# Patient Record
Sex: Female | Born: 1960
Health system: Southern US, Community
[De-identification: ages and names within clinical notes are randomized; demographics above are authoritative.]

## PROBLEM LIST (undated history)

## (undated) DIAGNOSIS — Z8489 Family history of other specified conditions: Secondary | ICD-10-CM

## (undated) DIAGNOSIS — F988 Other specified behavioral and emotional disorders with onset usually occurring in childhood and adolescence: Secondary | ICD-10-CM

## (undated) DIAGNOSIS — R030 Elevated blood-pressure reading, without diagnosis of hypertension: Secondary | ICD-10-CM

## (undated) DIAGNOSIS — T753XXA Motion sickness, initial encounter: Secondary | ICD-10-CM

## (undated) DIAGNOSIS — Z973 Presence of spectacles and contact lenses: Secondary | ICD-10-CM

## (undated) DIAGNOSIS — M1990 Unspecified osteoarthritis, unspecified site: Secondary | ICD-10-CM

## (undated) HISTORY — DX: Other specified behavioral and emotional disorders with onset usually occurring in childhood and adolescence: F98.8

## (undated) HISTORY — DX: Elevated blood-pressure reading, without diagnosis of hypertension: R03.0

## (undated) HISTORY — PX: BUNIONECTOMY: SHX129

---

## 2000-09-12 HISTORY — PX: BREAST BIOPSY: SHX20

## 2005-06-08 ENCOUNTER — Other Ambulatory Visit: Payer: Self-pay

## 2005-06-08 ENCOUNTER — Emergency Department: Payer: Self-pay | Admitting: Emergency Medicine

## 2008-08-23 ENCOUNTER — Emergency Department: Payer: Self-pay | Admitting: Emergency Medicine

## 2008-09-17 ENCOUNTER — Ambulatory Visit: Payer: Self-pay

## 2009-12-09 ENCOUNTER — Ambulatory Visit: Payer: Self-pay

## 2012-05-29 ENCOUNTER — Ambulatory Visit: Payer: Self-pay

## 2012-06-06 ENCOUNTER — Ambulatory Visit: Payer: Self-pay

## 2013-12-11 ENCOUNTER — Ambulatory Visit (INDEPENDENT_AMBULATORY_CARE_PROVIDER_SITE_OTHER): Payer: BC Managed Care – PPO

## 2013-12-11 ENCOUNTER — Ambulatory Visit (INDEPENDENT_AMBULATORY_CARE_PROVIDER_SITE_OTHER): Payer: BC Managed Care – PPO | Admitting: Podiatry

## 2013-12-11 ENCOUNTER — Encounter: Payer: Self-pay | Admitting: Podiatry

## 2013-12-11 VITALS — Resp 16 | Ht 58.5 in | Wt 188.0 lb

## 2013-12-11 DIAGNOSIS — M79609 Pain in unspecified limb: Secondary | ICD-10-CM

## 2013-12-11 DIAGNOSIS — M2020 Hallux rigidus, unspecified foot: Secondary | ICD-10-CM

## 2013-12-11 DIAGNOSIS — M79673 Pain in unspecified foot: Secondary | ICD-10-CM

## 2013-12-11 NOTE — Progress Notes (Signed)
   Subjective:    Patient ID: Megan [name], female    DOB: 26-Jan-1961, 53 y.o.   MRN: [id number]  HPI Comments: Its both feet on top where they are red and bumpy. Its been going on for 10 years and they are getting worse. i can only wear tennis shoes with orthotics in them. i take motrin when they get really bad. i took celebrex in the past and it helps.   Foot Pain Associated symptoms include numbness.      Review of Systems  Constitutional: Negative.   HENT: Negative.   Eyes: Negative.   Respiratory: Negative.   Cardiovascular: Negative.   Gastrointestinal: Negative.   Endocrine: Negative.   Musculoskeletal:       Joint pain Difficulty walking  Skin: Negative.   Allergic/Immunologic: Negative.   Neurological: Positive for numbness.  Hematological: Negative.   Psychiatric/Behavioral: The patient is nervous/anxious.        Objective:   Physical Exam I have reviewed her past medical history medications allergies surgeries social history and review of systems. Pulses are palpable bilateral. Neurologic sensorium is intact. Orthopedic evaluation demonstrates limited range of motion of the first metatarsophalangeal joint bilaterally. Radiographic evaluation confirms osteoarthritis of both first metatarsophalangeal joints bilaterally.        Assessment & Plan:  Assessment: Hallux rigidus first metatarsophalangeal joint bilateral.  Plan: Injected dexamethasone and local anesthetic to the first metatarsophalangeal joint bilaterally. Discussed the need for surgical intervention consisting of a arthroplasty with single silicone implant. I will followup with her in July for surgical consult.

## 2014-01-13 ENCOUNTER — Ambulatory Visit (INDEPENDENT_AMBULATORY_CARE_PROVIDER_SITE_OTHER): Payer: BC Managed Care – PPO | Admitting: Podiatry

## 2014-01-13 VITALS — BP 143/86 | HR 91 | Resp 16

## 2014-01-13 DIAGNOSIS — M778 Other enthesopathies, not elsewhere classified: Secondary | ICD-10-CM

## 2014-01-13 DIAGNOSIS — M7750 Other enthesopathy of unspecified foot: Secondary | ICD-10-CM

## 2014-01-13 DIAGNOSIS — M779 Enthesopathy, unspecified: Principal | ICD-10-CM

## 2014-01-13 MED ORDER — CELECOXIB 200 MG PO CAPS
200.0000 mg | ORAL_CAPSULE | Freq: Two times a day (BID) | ORAL | Status: DC
Start: 2014-01-13 — End: 2018-03-06

## 2014-01-13 NOTE — Progress Notes (Signed)
She presents today for followup of her capsulitis and hallux limitus first metatarsophalangeal joint right greater than left. She states that the right foot hurts worse since the injection the left foot feels better since the injection.  Objective: Pain on range of motion and on palpation of the first metatarsophalangeal joint minimally so left foot.  Assessment: Hallux limitus with capsulitis first metatarsophalangeal joint right greater than left.  Plan: Injected with Kenalog and local anesthetic first metatarsophalangeal joint today after sterile Betadine skin prep. We did not inject the left foot

## 2014-03-06 ENCOUNTER — Ambulatory Visit (INDEPENDENT_AMBULATORY_CARE_PROVIDER_SITE_OTHER): Payer: BC Managed Care – PPO | Admitting: Podiatry

## 2014-03-06 ENCOUNTER — Encounter: Payer: Self-pay | Admitting: Podiatry

## 2014-03-06 DIAGNOSIS — M779 Enthesopathy, unspecified: Principal | ICD-10-CM

## 2014-03-06 DIAGNOSIS — M778 Other enthesopathies, not elsewhere classified: Secondary | ICD-10-CM

## 2014-03-06 DIAGNOSIS — M7750 Other enthesopathy of unspecified foot: Secondary | ICD-10-CM

## 2014-03-06 NOTE — Progress Notes (Signed)
She presents today complaining of pain to the first metatarsophalangeal joints bilateral. History of hallux limitus bilateral. With capsulitis being majority of her symptoms.  Objective: Vital signs are stable she is alert and oriented x3. Pain on palpation and range of motion of the first metatarsophalangeal joint bilateral.  Assessment: Capsulitis hallux limitus bilateral first metatarsophalangeal joints bilateral.  Plan: Injected today after sterile Betadine skin prep 20 mg of Kenalog to the first metatarsophalangeal joints bilaterally intra-articular. Remember to ask her however tripped [facility] went. Also remember to ask her about her sick brother.

## 2015-05-20 ENCOUNTER — Other Ambulatory Visit: Payer: Self-pay | Admitting: Obstetrics and Gynecology

## 2015-05-20 DIAGNOSIS — Z1231 Encounter for screening mammogram for malignant neoplasm of breast: Secondary | ICD-10-CM

## 2015-05-20 DIAGNOSIS — Z13820 Encounter for screening for osteoporosis: Secondary | ICD-10-CM

## 2015-06-25 ENCOUNTER — Ambulatory Visit
Admission: RE | Admit: 2015-06-25 | Discharge: 2015-06-25 | Disposition: A | Discharge status: Home / Self Care | Payer: BLUE CROSS/BLUE SHIELD | Source: Ambulatory Visit | Attending: Obstetrics and Gynecology | Admitting: Obstetrics and Gynecology

## 2015-06-25 DIAGNOSIS — Z13820 Encounter for screening for osteoporosis: Secondary | ICD-10-CM

## 2015-06-25 DIAGNOSIS — Z1231 Encounter for screening mammogram for malignant neoplasm of breast: Secondary | ICD-10-CM | POA: Insufficient documentation

## 2015-06-29 ENCOUNTER — Other Ambulatory Visit: Payer: Self-pay | Admitting: Obstetrics and Gynecology

## 2015-06-29 DIAGNOSIS — R928 Other abnormal and inconclusive findings on diagnostic imaging of breast: Secondary | ICD-10-CM

## 2015-07-07 ENCOUNTER — Other Ambulatory Visit: Payer: Self-pay | Admitting: Obstetrics and Gynecology

## 2015-07-07 DIAGNOSIS — N63 Unspecified lump in unspecified breast: Secondary | ICD-10-CM

## 2015-07-16 ENCOUNTER — Ambulatory Visit
Admission: RE | Admit: 2015-07-16 | Discharge: 2015-07-16 | Disposition: A | Discharge status: Home / Self Care | Payer: BLUE CROSS/BLUE SHIELD | Source: Ambulatory Visit | Attending: Obstetrics and Gynecology | Admitting: Obstetrics and Gynecology

## 2015-07-16 DIAGNOSIS — N63 Unspecified lump in breast: Secondary | ICD-10-CM | POA: Diagnosis not present

## 2015-07-16 DIAGNOSIS — R928 Other abnormal and inconclusive findings on diagnostic imaging of breast: Secondary | ICD-10-CM | POA: Diagnosis present

## 2015-08-10 ENCOUNTER — Other Ambulatory Visit: Payer: Self-pay | Admitting: Orthopedic Surgery

## 2015-08-10 DIAGNOSIS — M79671 Pain in right foot: Secondary | ICD-10-CM

## 2015-08-18 ENCOUNTER — Ambulatory Visit
Admission: RE | Admit: 2015-08-18 | Discharge: 2015-08-18 | Disposition: A | Discharge status: Home / Self Care | Payer: BLUE CROSS/BLUE SHIELD | Source: Ambulatory Visit | Attending: Orthopedic Surgery | Admitting: Orthopedic Surgery

## 2015-08-18 DIAGNOSIS — M79671 Pain in right foot: Secondary | ICD-10-CM

## 2016-06-23 ENCOUNTER — Other Ambulatory Visit: Payer: Self-pay | Admitting: Obstetrics and Gynecology

## 2016-06-23 DIAGNOSIS — Z13820 Encounter for screening for osteoporosis: Secondary | ICD-10-CM

## 2016-06-23 DIAGNOSIS — Z1231 Encounter for screening mammogram for malignant neoplasm of breast: Secondary | ICD-10-CM

## 2016-09-09 ENCOUNTER — Telehealth: Payer: Self-pay | Admitting: Gastroenterology

## 2016-09-09 NOTE — Telephone Encounter (Signed)
colonoscopy

## 2016-11-30 ENCOUNTER — Other Ambulatory Visit: Payer: Self-pay

## 2016-11-30 ENCOUNTER — Telehealth: Payer: Self-pay

## 2016-11-30 DIAGNOSIS — Z1211 Encounter for screening for malignant neoplasm of colon: Secondary | ICD-10-CM

## 2016-11-30 NOTE — Telephone Encounter (Signed)
Screening colonoscopy at [facility] on 12/20/16 with Wohl.   precert for screening J00.93

## 2016-11-30 NOTE — Telephone Encounter (Signed)
Gastroenterology Pre-Procedure Review  Request Date:  Requesting Physician: Dr.   PATIENT REVIEW QUESTIONS: The patient responded to the following health history questions as indicated:    1. Are you having any GI issues? no 2. Do you have a personal history of Polyps? no 3. Do you have a family history of Colon Cancer or Polyps? no 4. Diabetes Mellitus? no 5. Joint replacements in the past 12 months?yes (Big toe joint (Right) Left foot bone spur removed) 6. Major health problems in the past 3 months?no 7. Any artificial heart valves, MVP, or defibrillator?no    MEDICATIONS & ALLERGIES:    Patient reports the following regarding taking any anticoagulation/antiplatelet therapy:   Plavix, Coumadin, Eliquis, Xarelto, Lovenox, Pradaxa, Brilinta, or Effient? no Aspirin? yes (ASA 325mg  Not taking)  Patient confirms/reports the following medications:  Current Outpatient Prescriptions  Medication Sig Dispense Refill  . ALPRAZolam (XANAX) 1 MG tablet     . amphetamine-dextroamphetamine (ADDERALL) 30 MG tablet     . aspirin EC 325 MG tablet Take 325 mg by mouth as needed.    . celecoxib (CELEBREX) 200 MG capsule Take 1 capsule (200 mg total) by mouth 2 (two) times daily. 30 capsule 5  . fluconazole (DIFLUCAN) 150 MG tablet     . ibuprofen (ADVIL,MOTRIN) 200 MG tablet Take 200 mg by mouth as needed.    . mupirocin ointment (BACTROBAN) 2 %     . valACYclovir (VALTREX) 500 MG tablet      No current facility-administered medications for this visit.     Patient confirms/reports the following allergies:  Allergies  Allergen Reactions  . Codeine Other (See Comments)    hyper  . Morphine And Related Hives  . Vicodin [Hydrocodone-Acetaminophen] Other (See Comments)    Stomach pain, hyper    No orders of the defined types were placed in this encounter.   AUTHORIZATION INFORMATION Primary Insurance: 1D#: Group #:  Secondary Insurance: 1D#: Group #:  SCHEDULE INFORMATION: Date:  12/20/16 Time: Location: [facility]

## 2016-12-02 NOTE — Telephone Encounter (Signed)
No prior auth required ded 300 rem 275 Max oop 700 rem 629 pu 70% npr This would apply only if your insurance company considers this as a diagnostic procedure. Most screening procedures are covered at 100%.

## 2016-12-19 ENCOUNTER — Other Ambulatory Visit: Payer: Self-pay

## 2016-12-19 ENCOUNTER — Telehealth: Payer: Self-pay | Admitting: Gastroenterology

## 2016-12-19 NOTE — Telephone Encounter (Signed)
Patient needs to r/s colonoscopy for 12/20/16 due to no following directions for prepping.

## 2016-12-19 NOTE — Telephone Encounter (Signed)
Patient called cancel colonoscopy due to Anesthesiologist being [place] for [place].  Pt will have PCP refer her to a GI doctor at [facility].

## 2016-12-28 ENCOUNTER — Ambulatory Visit
Admission: RE | Admit: 2016-12-28 | Discharge: 2016-12-28 | Disposition: A | Discharge status: Home / Self Care | Payer: BLUE CROSS/BLUE SHIELD | Source: Ambulatory Visit | Attending: Obstetrics and Gynecology | Admitting: Obstetrics and Gynecology

## 2016-12-28 DIAGNOSIS — Z13820 Encounter for screening for osteoporosis: Secondary | ICD-10-CM | POA: Insufficient documentation

## 2016-12-28 DIAGNOSIS — Z1231 Encounter for screening mammogram for malignant neoplasm of breast: Secondary | ICD-10-CM | POA: Diagnosis present

## 2017-01-03 ENCOUNTER — Ambulatory Visit
Admission: RE | Admit: 2017-01-03 | Payer: BLUE CROSS/BLUE SHIELD | Source: Ambulatory Visit | Admitting: Gastroenterology

## 2017-01-03 ENCOUNTER — Encounter: Admission: RE | Payer: Self-pay | Source: Ambulatory Visit

## 2017-01-03 SURGERY — COLONOSCOPY WITH PROPOFOL
Anesthesia: General

## 2018-02-19 DIAGNOSIS — G4709 Other insomnia: Secondary | ICD-10-CM | POA: Insufficient documentation

## 2018-02-19 DIAGNOSIS — A6000 Herpesviral infection of urogenital system, unspecified: Secondary | ICD-10-CM | POA: Insufficient documentation

## 2018-02-19 DIAGNOSIS — N8110 Cystocele, unspecified: Secondary | ICD-10-CM | POA: Insufficient documentation

## 2018-02-19 DIAGNOSIS — F988 Other specified behavioral and emotional disorders with onset usually occurring in childhood and adolescence: Secondary | ICD-10-CM | POA: Insufficient documentation

## 2018-02-19 DIAGNOSIS — Z8639 Personal history of other endocrine, nutritional and metabolic disease: Secondary | ICD-10-CM | POA: Insufficient documentation

## 2018-02-20 ENCOUNTER — Other Ambulatory Visit: Payer: Self-pay | Admitting: Internal Medicine

## 2018-02-20 DIAGNOSIS — Z1231 Encounter for screening mammogram for malignant neoplasm of breast: Secondary | ICD-10-CM

## 2018-03-06 ENCOUNTER — Encounter: Payer: Self-pay | Admitting: Internal Medicine

## 2018-03-06 ENCOUNTER — Ambulatory Visit: Payer: BLUE CROSS/BLUE SHIELD | Admitting: Internal Medicine

## 2018-03-06 VITALS — BP 126/86 | HR 104 | Resp 16 | Ht <= 58 in | Wt 183.8 lb

## 2018-03-06 DIAGNOSIS — Z1231 Encounter for screening mammogram for malignant neoplasm of breast: Secondary | ICD-10-CM

## 2018-03-06 DIAGNOSIS — F988 Other specified behavioral and emotional disorders with onset usually occurring in childhood and adolescence: Secondary | ICD-10-CM | POA: Diagnosis not present

## 2018-03-06 DIAGNOSIS — G4700 Insomnia, unspecified: Secondary | ICD-10-CM | POA: Diagnosis not present

## 2018-03-06 DIAGNOSIS — Z1239 Encounter for other screening for malignant neoplasm of breast: Secondary | ICD-10-CM

## 2018-03-06 DIAGNOSIS — R030 Elevated blood-pressure reading, without diagnosis of hypertension: Secondary | ICD-10-CM | POA: Diagnosis not present

## 2018-03-06 DIAGNOSIS — G479 Sleep disorder, unspecified: Secondary | ICD-10-CM

## 2018-03-06 DIAGNOSIS — B009 Herpesviral infection, unspecified: Secondary | ICD-10-CM

## 2018-03-06 MED ORDER — AMPHETAMINE-DEXTROAMPHETAMINE 10 MG PO TABS: ORAL_TABLET | ORAL | 0 refills | Status: DC

## 2018-03-06 MED ORDER — AMPHETAMINE-DEXTROAMPHETAMINE 30 MG PO TABS: 30.0000 mg | ORAL_TABLET | Freq: Every day | ORAL | 0 refills | Status: DC

## 2018-03-06 NOTE — Progress Notes (Signed)
[facility] [place], [place] [address]  Internal MEDICINE  Office Visit Note  Patient Name: Megan [name]  [id number]  [id number]  Date of Service: 03/06/2018   Complaints/HPI Pt is here for establishment of PCP.  Pt has long history of ADD.  Has been on Adderall for last 25 years.  She is changing PCP due to insurance coverage.  She will need preventative health maintaince including mamogram and colonoscopy.  She is a menapausal with no HRT ever.  She is unaware of her cholesterol.    Pt also has problems falling and maintaining her sleep.  She has excessive daytime fatigue. Which is why she takes adderall.  PT has never had a sleep study or evaluation for any sleep related disorders. Pt also takes Xanax for sleep.  Pt has epidosdes of rash to left buttock, that she takes valtrex for as needed.   Current Medication: Outpatient Encounter Medications as of 03/06/2018  Medication Sig Note  . ALPRAZolam Duanne Moron) 1 MG tablet  12/11/2013: Received from: External Pharmacy  . amphetamine-dextroamphetamine (ADDERALL) 10 MG tablet Take one tab po around lunch   . valACYclovir (VALTREX) 500 MG tablet  03/06/2014: Received from: External Pharmacy  . [DISCONTINUED] ADDERALL XR 30 MG 24 hr capsule Take 30 mg by mouth every morning.   . [DISCONTINUED] amphetamine-dextroamphetamine (ADDERALL) 10 MG tablet Take 10 mg by mouth daily with breakfast.   . [DISCONTINUED] folic acid (FOLVITE) 1 MG tablet Take 2 mg by mouth 2 (two) times daily.   . [DISCONTINUED] Glucos-Chond-Hyal Ac-Ca Fructo (MOVE FREE JOINT HEALTH ADVANCE PO) Take by mouth.   . [DISCONTINUED] ibuprofen (ADVIL,MOTRIN) 200 MG tablet Take 200 mg by mouth as needed.   . [DISCONTINUED] Lactobacillus (PROBIOTIC ACIDOPHILUS PO) Take by mouth.   Marland Kitchen amphetamine-dextroamphetamine (ADDERALL) 30 MG tablet Take 1 tablet by mouth daily.   Marland Kitchen aspirin EC 325 MG tablet Take 325 mg by mouth as needed.   . Vitamin D, Ergocalciferol,  (DRISDOL) 50000 units CAPS capsule TAKE 1 CAPSULE BY MOUTH EVERY 2 WEEKS   . [DISCONTINUED] amphetamine-dextroamphetamine (ADDERALL) 30 MG tablet  12/11/2013: Received from: External Pharmacy  . [DISCONTINUED] celecoxib (CELEBREX) 200 MG capsule Take 1 capsule (200 mg total) by mouth 2 (two) times daily. (Patient not taking: Reported on 03/06/2018)   . [DISCONTINUED] diazepam (VALIUM) 5 MG tablet TAKE 2 TABLETS BY MOUTH 1 HOUR PRIOR TO APPT AND BRING OTHER TO APPT   . [DISCONTINUED] fluconazole (DIFLUCAN) 150 MG tablet  03/06/2014: Received from: External Pharmacy  . [DISCONTINUED] fluticasone (FLONASE) 50 MCG/ACT nasal spray Place into the nose.   . [DISCONTINUED] lidocaine (XYLOCAINE) 2 % solution GARGLE AND SPIT 10-15 ML BY MOUTH EVERY 4 HOURS AS NEEDED FOR SORE THROAT FOR UP TO 3 DAYS   . [DISCONTINUED] mupirocin ointment (BACTROBAN) 2 %  03/06/2014: Received from: External Pharmacy   No facility-administered encounter medications on file as of 03/06/2018.     Surgical History: Past Surgical History:  Procedure Laterality Date  . BREAST BIOPSY Left 2002   benign  . BUNIONECTOMY Right   . CESAREAN SECTION      Medical History: Past Medical History:  Diagnosis Date  . ADD (attention deficit disorder)   . Pre-hypertension     Family History: Family History  Problem Relation Age of Onset  . Lung cancer Father   . Diabetes Sister   . Breast cancer Neg Hx     Social History   Socioeconomic History  . Marital  status: Married    Spouse name: Not on file  . Number of children: Not on file  . Years of education: Not on file  . Highest education level: Not on file  Occupational History  . Not on file  Social Needs  . Financial resource strain: Not on file  . Food insecurity:    Worry: Not on file    Inability: Not on file  . Transportation needs:    Medical: Not on file    Non-medical: Not on file  Tobacco Use  . Smoking status: Never Smoker  . Smokeless tobacco: Never Used   Substance and Sexual Activity  . Alcohol use: Yes    Comment: social  . Drug use: Not on file  . Sexual activity: Not on file  Lifestyle  . Physical activity:    Days per week: Not on file    Minutes per session: Not on file  . Stress: Not on file  Relationships  . Social connections:    Talks on phone: Not on file    Gets together: Not on file    Attends religious service: Not on file    Active member of club or organization: Not on file    Attends meetings of clubs or organizations: Not on file    Relationship status: Not on file  . Intimate partner violence:    Fear of current or ex partner: Not on file    Emotionally abused: Not on file    Physically abused: Not on file    Forced sexual activity: Not on file  Other Topics Concern  . Not on file  Social History Narrative  . Not on file     Review of Systems  Constitutional: Negative for chills, diaphoresis and fatigue.  HENT: Negative for ear pain, postnasal drip and sinus pressure.   Eyes: Negative for photophobia, discharge, redness, itching and visual disturbance.  Respiratory: Negative for cough, shortness of breath and wheezing.   Cardiovascular: Positive for chest pain. Negative for palpitations and leg swelling.  Gastrointestinal: Negative for abdominal pain, constipation, diarrhea, nausea and vomiting.  Genitourinary: Negative for dysuria and flank pain.  Musculoskeletal: Negative for arthralgias, back pain, gait problem and neck pain.  Skin: Negative for color change.  Allergic/Immunologic: Negative for environmental allergies and food allergies.  Neurological: Negative for dizziness and headaches.  Hematological: Does not bruise/bleed easily.  Psychiatric/Behavioral: Negative for agitation, behavioral problems (depression) and hallucinations.    Vital Signs: BP 126/86   Pulse (!) 104   Resp 16   Ht 4\' 10"  (1.473 m)   Wt 183 lb 12.8 oz (83.4 kg)   SpO2 99%   BMI 38.41 kg/m    Physical Exam   Constitutional: She is oriented to person, place, and time. She appears well-developed and well-nourished. No distress.  HENT:  Head: Normocephalic and atraumatic.  Mouth/Throat: Oropharynx is clear and moist. No oropharyngeal exudate.  Eyes: Pupils are equal, round, and reactive to light. EOM are normal.  Neck: Normal range of motion. Neck supple. No JVD present. No tracheal deviation present. No thyromegaly present.  Cardiovascular: Normal rate, regular rhythm and normal heart sounds. Exam reveals no gallop and no friction rub.  No murmur heard. Pulmonary/Chest: Effort normal. No respiratory distress. She has no wheezes. She has no rales. She exhibits no tenderness.  Musculoskeletal: Normal range of motion.  Lymphadenopathy:    She has no cervical adenopathy.  Neurological: She is alert and oriented to person, place, and time. No cranial  nerve deficit.  Skin: Skin is warm and dry. She is not diaphoretic.  Psychiatric: She has a normal mood and affect. Her behavior is normal. Judgment and thought content normal.   Assessment/Plan: 1. Attention deficit disorder (ADD) without hyperactivity Continue medications as before.  Refills provided for a month.  However, will need further evaluation and may need change in therapy. Tachy cardia noticed  - amphetamine-dextroamphetamine (ADDERALL) 10 MG tablet; Take one tab po around lunch  Dispense: 30 tablet; Refill: 0 - amphetamine-dextroamphetamine (ADDERALL) 30 MG tablet; Take 1 tablet by mouth daily.  Dispense: 30 tablet; Refill: 0  2. Insomnia, unspecified type Continue Xanax as before. No RX given today. Again, patient will need further evaluation for generalized anxiety disorder.   3. HSV (herpes simplex virus) infection No recent flare ups. However, suppression therapy discussed with patient.   4. Sleep disturbance Symptoms concerning sleep apnea, will order home sleep test.  - Home sleep test  5. Breast screening - MM DIGITAL  SCREENING BILATERAL; Future  6. Elevated blood pressure reading  Hypertension Counseling:   The following hypertensive lifestyle modification were recommended and discussed:  1. Limiting alcohol intake to less than 1 oz/day of ethanol:(24 oz of beer or 8 oz of wine or 2 oz of 100-proof whiskey). 2. Take baby ASA 81 mg daily. 3. Importance of regular aerobic exercise and losing weight. 4. Reduce dietary saturated fat and cholesterol intake for overall cardiovascular health. 5. Maintaining adequate dietary potassium, calcium, and magnesium intake. 6. Regular monitoring of the blood pressure. 7. Reduce sodium intake to less than 100 mmol/day (less than 2.3 gm of sodium or less than 6 gm of sodium choride)    General Counseling: Derrick verbalizes understanding of the findings of todays visit and agrees with plan of treatment. I have discussed any further diagnostic evaluation that may be needed or ordered today. We also reviewed her medications today. she has been encouraged to call the office with any questions or concerns that should arise related to todays visit.     Orders Placed This Encounter  Procedures  . MM DIGITAL SCREENING BILATERAL  . Home sleep test    Meds ordered this encounter  Medications  . amphetamine-dextroamphetamine (ADDERALL) 10 MG tablet    Sig: Take one tab po around lunch    Dispense:  30 tablet    Refill:  0  . amphetamine-dextroamphetamine (ADDERALL) 30 MG tablet    Sig: Take 1 tablet by mouth daily.    Dispense:  30 tablet    Refill:  0    Time spent:25 Minutes

## 2018-04-13 ENCOUNTER — Other Ambulatory Visit: Payer: Self-pay | Admitting: Internal Medicine

## 2018-04-14 LAB — BASIC METABOLIC PANEL
BUN/Creatinine Ratio: 28 — ABNORMAL HIGH (ref 9–23)
BUN: 21 mg/dL (ref 6–24)
CO2: 26 mmol/L (ref 20–29)
Calcium: 9.5 mg/dL (ref 8.7–10.2)
Chloride: 103 mmol/L (ref 96–106)
Creatinine, Ser: 0.75 mg/dL (ref 0.57–1.00)
GFR calc Af Amer: 101 mL/min/{1.73_m2} (ref >59)
GFR calc non Af Amer: 88 mL/min/{1.73_m2} (ref >59)
Glucose: 113 mg/dL — ABNORMAL HIGH (ref 65–99)
Potassium: 4.9 mmol/L (ref 3.5–5.2)
Sodium: 142 mmol/L (ref 134–144)

## 2018-04-14 LAB — CBC WITH DIFFERENTIAL/PLATELET
Basophils Absolute: 0 10*3/uL (ref 0.0–0.2)
Basos: 1 %
EOS (ABSOLUTE): 0.1 10*3/uL (ref 0.0–0.4)
Eos: 2 %
Hematocrit: 46.8 % — ABNORMAL HIGH (ref 34.0–46.6)
Hemoglobin: 15 g/dL (ref 11.1–15.9)
Immature Grans (Abs): 0 10*3/uL (ref 0.0–0.1)
Immature Granulocytes: 1 %
Lymphocytes Absolute: 1.5 10*3/uL (ref 0.7–3.1)
Lymphs: 26 %
MCH: 26.6 pg (ref 26.6–33.0)
MCHC: 32.1 g/dL (ref 31.5–35.7)
MCV: 83 fL (ref 79–97)
Monocytes Absolute: 0.6 10*3/uL (ref 0.1–0.9)
Monocytes: 10 %
Neutrophils Absolute: 3.5 10*3/uL (ref 1.4–7.0)
Neutrophils: 60 %
Platelets: 261 10*3/uL (ref 150–450)
RBC: 5.64 x10E6/uL — ABNORMAL HIGH (ref 3.77–5.28)
RDW: 12.7 % (ref 12.3–15.4)
WBC: 5.9 10*3/uL (ref 3.4–10.8)

## 2018-04-14 LAB — TSH: TSH: 2.63 u[IU]/mL (ref 0.450–4.500)

## 2018-04-14 LAB — LIPID PANEL W/O CHOL/HDL RATIO
Cholesterol, Total: 202 mg/dL — ABNORMAL HIGH (ref 100–199)
HDL: 66 mg/dL (ref >39)
LDL Calculated: 125 mg/dL — ABNORMAL HIGH (ref 0–99)
Triglycerides: 54 mg/dL (ref 0–149)
VLDL Cholesterol Cal: 11 mg/dL (ref 5–40)

## 2018-04-14 LAB — T4, FREE: Free T4: 1.18 ng/dL (ref 0.82–1.77)

## 2018-04-16 ENCOUNTER — Ambulatory Visit: Payer: BLUE CROSS/BLUE SHIELD | Admitting: Adult Health

## 2018-04-16 ENCOUNTER — Encounter: Payer: Self-pay | Admitting: Adult Health

## 2018-04-16 VITALS — BP 142/98 | HR 103 | Resp 16 | Ht <= 58 in | Wt 186.8 lb

## 2018-04-16 DIAGNOSIS — E669 Obesity, unspecified: Secondary | ICD-10-CM

## 2018-04-16 DIAGNOSIS — R030 Elevated blood-pressure reading, without diagnosis of hypertension: Secondary | ICD-10-CM

## 2018-04-16 DIAGNOSIS — G479 Sleep disorder, unspecified: Secondary | ICD-10-CM

## 2018-04-16 DIAGNOSIS — F988 Other specified behavioral and emotional disorders with onset usually occurring in childhood and adolescence: Secondary | ICD-10-CM

## 2018-04-16 DIAGNOSIS — B009 Herpesviral infection, unspecified: Secondary | ICD-10-CM | POA: Diagnosis not present

## 2018-04-16 DIAGNOSIS — Z0001 Encounter for general adult medical examination with abnormal findings: Secondary | ICD-10-CM | POA: Diagnosis not present

## 2018-04-16 DIAGNOSIS — R300 Dysuria: Secondary | ICD-10-CM

## 2018-04-16 DIAGNOSIS — Z6839 Body mass index (BMI) 39.0-39.9, adult: Secondary | ICD-10-CM

## 2018-04-16 MED ORDER — AMPHETAMINE-DEXTROAMPHETAMINE 10 MG PO TABS: ORAL_TABLET | ORAL | 0 refills | Status: DC

## 2018-04-16 MED ORDER — AMPHETAMINE-DEXTROAMPHETAMINE 30 MG PO TABS: 30.0000 mg | ORAL_TABLET | Freq: Every day | ORAL | 0 refills | Status: DC

## 2018-04-16 MED ORDER — AMPHETAMINE-DEXTROAMPHETAMINE 10 MG PO TABS: 10.0000 mg | ORAL_TABLET | Freq: Every day | ORAL | 0 refills | Status: DC

## 2018-04-16 MED ORDER — ALPRAZOLAM 1 MG PO TABS: 1.0000 mg | ORAL_TABLET | Freq: Every evening | ORAL | 0 refills | Status: DC | PRN

## 2018-04-16 NOTE — Progress Notes (Signed)
[facility] [place], [place] [address]  Internal MEDICINE  Office Visit Note  Patient Name: Megan [name]  [id number]  [id number]  Date of Service: 04/16/2018  Chief Complaint  Patient presents with  . Annual Exam  . Quality Metric Gaps    colonoscopy     HPI Pt is here for routine health maintenance examination.  Pt needs her medications refilled at this time.  She reports she has been on Adderal for more than 20 years.  She takes a 30mg  dose daily, with a 10 mg in addition, and as needed.  At her last visit she was unable to get her 10mg  pills, because the pharmacy had them on backorder.    Current Medication: Outpatient Encounter Medications as of 04/16/2018  Medication Sig Note  . ALPRAZolam Duanne Moron) 1 MG tablet  12/11/2013: Received from: External Pharmacy  . amphetamine-dextroamphetamine (ADDERALL) 10 MG tablet Take one tab po around lunch   . amphetamine-dextroamphetamine (ADDERALL) 30 MG tablet Take 1 tablet by mouth daily.   Marland Kitchen aspirin EC 325 MG tablet Take 325 mg by mouth as needed.   . valACYclovir (VALTREX) 500 MG tablet  03/06/2014: Received from: External Pharmacy  . Vitamin D, Ergocalciferol, (DRISDOL) 50000 units CAPS capsule TAKE 1 CAPSULE BY MOUTH EVERY 2 WEEKS    No facility-administered encounter medications on file as of 04/16/2018.     Surgical History: Past Surgical History:  Procedure Laterality Date  . BREAST BIOPSY Left 2002   benign  . BUNIONECTOMY Right   . CESAREAN SECTION      Medical History: Past Medical History:  Diagnosis Date  . ADD (attention deficit disorder)   . Pre-hypertension     Family History: Family History  Problem Relation Age of Onset  . Lung cancer Father   . Diabetes Sister   . Breast cancer Neg Hx       Review of Systems  Constitutional: Negative for chills, fatigue and unexpected weight change.  HENT: Negative for congestion, rhinorrhea, sneezing and sore throat.   Eyes: Negative  for photophobia, pain and redness.  Respiratory: Negative for cough, chest tightness and shortness of breath.   Cardiovascular: Negative for chest pain and palpitations.  Gastrointestinal: Negative for abdominal pain, constipation, diarrhea, nausea and vomiting.  Endocrine: Negative.   Genitourinary: Negative for dysuria and frequency.  Musculoskeletal: Negative for arthralgias, back pain, joint swelling and neck pain.  Skin: Negative for rash.  Allergic/Immunologic: Negative.   Neurological: Negative for tremors and numbness.  Hematological: Negative for adenopathy. Does not bruise/bleed easily.  Psychiatric/Behavioral: Negative for behavioral problems and sleep disturbance. The patient is not nervous/anxious.      Vital Signs: BP (!) 142/98   Pulse (!) 103   Resp 16   Ht 4\' 10"  (1.473 m)   Wt 186 lb 12.8 oz (84.7 kg)   SpO2 99%   BMI 39.04 kg/m    Physical Exam  Constitutional: She is oriented to person, place, and time. She appears well-developed and well-nourished. No distress.  HENT:  Head: Normocephalic and atraumatic.  Mouth/Throat: Oropharynx is clear and moist. No oropharyngeal exudate.  Eyes: Pupils are equal, round, and reactive to light. EOM are normal.  Neck: Normal range of motion. Neck supple. No JVD present. No tracheal deviation present. No thyromegaly present.  Cardiovascular: Normal rate, regular rhythm and normal heart sounds. Exam reveals no gallop and no friction rub.  No murmur heard. Pulmonary/Chest: Effort normal and breath sounds normal. No respiratory distress.  She has no wheezes. She has no rales. She exhibits no tenderness. No breast swelling, tenderness, discharge or bleeding. Breasts are symmetrical.  Abdominal: Soft. There is no tenderness. There is no guarding.  Musculoskeletal: Normal range of motion.  Lymphadenopathy:    She has no cervical adenopathy.  Neurological: She is alert and oriented to person, place, and time. No cranial nerve  deficit.  Skin: Skin is warm and dry. She is not diaphoretic.  Psychiatric: She has a normal mood and affect. Her behavior is normal. Judgment and thought content normal.  Nursing note and vitals reviewed.    LABS: Recent Results (from the past 2160 hour(s))  CBC with Differential/Platelet     Status: Abnormal   Collection Time: 04/13/18  9:13 AM  Result Value Ref Range   WBC 5.9 3.4 - 10.8 x10E3/uL   RBC 5.64 (H) 3.77 - 5.28 x10E6/uL   Hemoglobin 15.0 11.1 - 15.9 g/dL   Hematocrit 46.8 (H) 34.0 - 46.6 %   MCV 83 79 - 97 fL   MCH 26.6 26.6 - 33.0 pg   MCHC 32.1 31.5 - 35.7 g/dL   RDW 12.7 12.3 - 15.4 %   Platelets 261 150 - 450 x10E3/uL   Neutrophils 60 Not Estab. %   Lymphs 26 Not Estab. %   Monocytes 10 Not Estab. %   Eos 2 Not Estab. %   Basos 1 Not Estab. %   Neutrophils Absolute 3.5 1.4 - 7.0 x10E3/uL   Lymphocytes Absolute 1.5 0.7 - 3.1 x10E3/uL   Monocytes Absolute 0.6 0.1 - 0.9 x10E3/uL   EOS (ABSOLUTE) 0.1 0.0 - 0.4 x10E3/uL   Basophils Absolute 0.0 0.0 - 0.2 x10E3/uL   Immature Granulocytes 1 Not Estab. %   Immature Grans (Abs) 0.0 0.0 - 0.1 [id number]/SE  Basic metabolic panel     Status: Abnormal   Collection Time: 04/13/18  9:13 AM  Result Value Ref Range   Glucose 113 (H) 65 - 99 mg/dL   BUN 21 6 - 24 mg/dL   Creatinine, Ser 0.75 0.57 - 1.00 mg/dL   GFR calc non Af Amer 88 >59 mL/min/1.73   GFR calc Af Amer 101 >59 mL/min/1.73   BUN/Creatinine Ratio 28 (H) 9 - 23   Sodium 142 134 - 144 mmol/L   Potassium 4.9 3.5 - 5.2 mmol/L   Chloride 103 96 - 106 mmol/L   CO2 26 20 - 29 mmol/L   Calcium 9.5 8.7 - 10.2 mg/dL  Lipid Panel w/o Chol/HDL Ratio     Status: Abnormal   Collection Time: 04/13/18  9:13 AM  Result Value Ref Range   Cholesterol, Total 202 (H) 100 - 199 mg/dL   Triglycerides 54 0 - 149 mg/dL   HDL 66 >39 mg/dL   VLDL Cholesterol Cal 11 5 - 40 mg/dL   LDL Calculated 125 (H) 0 - 99 mg/dL  T4, free     Status: None   Collection Time: 04/13/18  9:13  AM  Result Value Ref Range   Free T4 1.18 0.82 - 1.77 ng/dL  TSH     Status: None   Collection Time: 04/13/18  9:13 AM  Result Value Ref Range   TSH 2.630 0.450 - 4.500 uIU/mL    Assessment/Plan: 1. Encounter for general adult medical examination with abnormal findings Blood work obtained at previous visit.    2. Attention deficit disorder (ADD) without hyperactivity Refilled medications. 3 months worth sent to [facility].  - amphetamine-dextroamphetamine (ADDERALL) 10 MG tablet; Take  one tab po around lunch  Dispense: 30 tablet; Refill: 0 - amphetamine-dextroamphetamine (ADDERALL) 30 MG tablet; Take 1 tablet by mouth daily.  Dispense: 30 tablet; Refill: 0  3. HSV (herpes simplex virus) infection No recent outbreaks, continue Valtrex as needed for break outs.   4. Elevated blood pressure reading Pt declined HTN medications at this time.  She has been on them in the past, and didn't like the side effects.  She is starting weight watchers and would like to wait and see if losing weight will help her HTN.   5. Sleep disturbance - ALPRAZolam (XANAX) 1 MG tablet; Take 1 tablet (1 mg total) by mouth at bedtime as needed for anxiety or sleep.  Dispense: 30 tablet; Refill: 0  6. Dysuria - UA/M w/rflx Culture, Routine  7. Class 2 obesity without serious comorbidity with body mass index (BMI) of 39.0 to 39.9 in adult, unspecified obesity type Obesity Counseling: Risk Assessment: An assessment of behavioral risk factors was made today and includes lack of exercise sedentary lifestyle, lack of portion control and poor dietary habits.  Risk Modification Advice: She was counseled on portion control guidelines. Restricting daily caloric intake to. . The detrimental long term effects of obesity on her health and ongoing poor compliance was also discussed with the patient.   General Counseling: syretta kochel understanding of the findings of todays visit and agrees with plan of treatment. I have  discussed any further diagnostic evaluation that may be needed or ordered today. We also reviewed her medications today. she has been encouraged to call the office with any questions or concerns that should arise related to todays visit.   Orders Placed This Encounter  Procedures  . UA/M w/rflx Culture, Routine    No orders of the defined types were placed in this encounter.   Time spent: 30 Minutes   This patient was seen by Orson Gear AGNP-C in Collaboration with Dr Lavera Guise as a part of collaborative care agreement   Lavera Guise, MD  Internal Medicine

## 2018-04-16 NOTE — Patient Instructions (Signed)

## 2018-04-17 LAB — UA/M W/RFLX CULTURE, ROUTINE
Bilirubin, UA: NEGATIVE
Glucose, UA: NEGATIVE
Ketones, UA: NEGATIVE
Leukocytes, UA: NEGATIVE
Nitrite, UA: NEGATIVE
Protein, UA: NEGATIVE
RBC, UA: NEGATIVE
Specific Gravity, UA: 1.025 (ref 1.005–1.030)
Urobilinogen, Ur: 0.2 mg/dL (ref 0.2–1.0)
pH, UA: 5 (ref 5.0–7.5)

## 2018-04-17 LAB — MICROSCOPIC EXAMINATION
Bacteria, UA: NONE SEEN
Casts: NONE SEEN /lpf

## 2018-04-19 ENCOUNTER — Encounter: Payer: Self-pay | Admitting: Adult Health

## 2018-05-11 ENCOUNTER — Ambulatory Visit
Admission: RE | Admit: 2018-05-11 | Discharge: 2018-05-11 | Disposition: A | Discharge status: Home / Self Care | Payer: BLUE CROSS/BLUE SHIELD | Source: Ambulatory Visit | Attending: Internal Medicine | Admitting: Internal Medicine

## 2018-05-11 DIAGNOSIS — Z1231 Encounter for screening mammogram for malignant neoplasm of breast: Secondary | ICD-10-CM | POA: Insufficient documentation

## 2018-05-11 DIAGNOSIS — Z1239 Encounter for other screening for malignant neoplasm of breast: Secondary | ICD-10-CM

## 2018-05-21 ENCOUNTER — Encounter: Payer: Self-pay | Admitting: Internal Medicine

## 2018-05-21 NOTE — Telephone Encounter (Signed)
Hey. Did you see this patient? I got this message, but I am not sure which medication she is talking about.

## 2018-05-22 ENCOUNTER — Other Ambulatory Visit: Payer: Self-pay | Admitting: Adult Health

## 2018-05-22 ENCOUNTER — Telehealth: Payer: Self-pay

## 2018-05-22 ENCOUNTER — Other Ambulatory Visit: Payer: Self-pay

## 2018-05-22 NOTE — Telephone Encounter (Signed)
Spoke with pt about she send message through [place] we already esend adderall for 3 months to her phar

## 2018-05-28 ENCOUNTER — Other Ambulatory Visit: Payer: Self-pay | Admitting: Adult Health

## 2018-05-28 DIAGNOSIS — G479 Sleep disorder, unspecified: Secondary | ICD-10-CM

## 2018-05-28 MED ORDER — ALPRAZOLAM 1 MG PO TABS: 1.0000 mg | ORAL_TABLET | Freq: Every evening | ORAL | 1 refills | Status: DC | PRN

## 2018-05-31 ENCOUNTER — Telehealth: Payer: Self-pay

## 2018-06-01 ENCOUNTER — Other Ambulatory Visit: Payer: Self-pay | Admitting: Adult Health

## 2018-06-01 ENCOUNTER — Telehealth: Payer: Self-pay

## 2018-06-01 MED ORDER — AMPHETAMINE-DEXTROAMPHETAMINE 10 MG PO TABS: 10.0000 mg | ORAL_TABLET | Freq: Every day | ORAL | 0 refills | Status: DC

## 2018-06-01 MED ORDER — AMPHETAMINE-DEXTROAMPHETAMINE 30 MG PO TABS: 30.0000 mg | ORAL_TABLET | Freq: Every day | ORAL | 0 refills | Status: DC

## 2018-06-01 NOTE — Telephone Encounter (Signed)
Pt advised we send adderall to your phar and cancelled all three pres from [facility]

## 2018-06-01 NOTE — Telephone Encounter (Signed)
Sent to Pharmacy.  30mg  for today, with a refill for one month from now.  And 10mg  tabs renewed for 10/5

## 2018-06-15 ENCOUNTER — Encounter: Payer: Self-pay | Admitting: Adult Health

## 2018-06-15 ENCOUNTER — Telehealth: Payer: Self-pay

## 2018-06-15 LAB — COLOGUARD

## 2018-06-15 NOTE — Telephone Encounter (Signed)
CALLED PT AND NOTIFIED HER THAT HER COLOGUARD RESULTS ARE NEGATIVE.

## 2018-06-15 NOTE — Progress Notes (Signed)
SCANNED IN COLOGUARD RESULTS REPORTED ON 06/09/18.

## 2018-07-18 ENCOUNTER — Ambulatory Visit: Payer: BLUE CROSS/BLUE SHIELD | Admitting: Adult Health

## 2018-07-18 ENCOUNTER — Encounter: Payer: Self-pay | Admitting: Adult Health

## 2018-07-18 VITALS — BP 142/98 | HR 108 | Resp 16 | Ht <= 58 in | Wt 188.0 lb

## 2018-07-18 DIAGNOSIS — Z8632 Personal history of gestational diabetes: Secondary | ICD-10-CM

## 2018-07-18 DIAGNOSIS — R030 Elevated blood-pressure reading, without diagnosis of hypertension: Secondary | ICD-10-CM | POA: Diagnosis not present

## 2018-07-18 DIAGNOSIS — H9190 Unspecified hearing loss, unspecified ear: Secondary | ICD-10-CM

## 2018-07-18 DIAGNOSIS — Z6839 Body mass index (BMI) 39.0-39.9, adult: Secondary | ICD-10-CM

## 2018-07-18 DIAGNOSIS — E669 Obesity, unspecified: Secondary | ICD-10-CM

## 2018-07-18 DIAGNOSIS — F988 Other specified behavioral and emotional disorders with onset usually occurring in childhood and adolescence: Secondary | ICD-10-CM | POA: Diagnosis not present

## 2018-07-18 DIAGNOSIS — Z79899 Other long term (current) drug therapy: Secondary | ICD-10-CM

## 2018-07-18 LAB — POCT GLYCOSYLATED HEMOGLOBIN (HGB A1C): Hemoglobin A1C: 5.4 % (ref 4.0–5.6)

## 2018-07-18 LAB — POCT URINE DRUG SCREEN
POC Amphetamine UR: POSITIVE — AB
POC BENZODIAZEPINES UR: NOT DETECTED
POC Barbiturate UR: NOT DETECTED
POC Cocaine UR: NOT DETECTED
POC Marijuana UR: NOT DETECTED
POC Methadone UR: NOT DETECTED
POC Methamphetamine UR: NOT DETECTED
POC Opiate Ur: NOT DETECTED
POC Oxycodone UR: NOT DETECTED
POC PHENCYCLIDINE UR: NOT DETECTED
POC TRICYCLICS UR: NOT DETECTED

## 2018-07-18 MED ORDER — AMPHETAMINE-DEXTROAMPHETAMINE 30 MG PO TABS: 30.0000 mg | ORAL_TABLET | Freq: Every day | ORAL | 0 refills | Status: DC

## 2018-07-18 MED ORDER — AMPHETAMINE-DEXTROAMPHETAMINE 10 MG PO TABS: 10.0000 mg | ORAL_TABLET | Freq: Every day | ORAL | 0 refills | Status: DC

## 2018-07-18 NOTE — Patient Instructions (Signed)
Amphetamine; Dextroamphetamine extended-release capsules What is this medicine? AMPHETAMINE; DEXTROAMPHETAMINE (am FET a meen; dex troe am FET a meen) is used to treat attention-deficit hyperactivity disorder (ADHD). Federal law prohibits giving this medicine to any person other than the person for whom it was prescribed. Do not share this medicine with anyone else. This medicine may be used for other purposes; ask your health care provider or pharmacist if you have questions. COMMON BRAND NAME(S): Adderall XR, Mydayis What should I tell my health care provider before I take this medicine? They need to know if you have any of these conditions: -anxiety or panic attacks -circulation problems in fingers and toes -glaucoma -hardening or blockages of the arteries or heart blood vessels -heart disease or a heart defect -high blood pressure -history of a drug or alcohol abuse problem -history of stroke -kidney disease -liver disease -mental illness -seizures -suicidal thoughts, plans, or attempt; a previous suicide attempt by you or a family member -thyroid disease -Tourette's syndrome -an unusual or allergic reaction to dextroamphetamine, other amphetamines, other medicines, foods, dyes, or preservatives -pregnant or trying to get pregnant -breast-feeding How should I use this medicine? Take this medicine by mouth with a glass of water. Follow the directions on the prescription label. This medicine is taken just one time per day, usually in the morning after waking up. Take with or without food. Do not chew or crush this medicine. You may open the capsules and sprinkle the medicine on a spoonful of applesauce. If sprinkled on applesauce, take the dose immediately and do not crush or chew. Always drink a glass of water or other liquid after taking this medicine. Do not take your medicine more often than directed. A special MedGuide will be given to you by the pharmacist with each prescription  and refill. Be sure to read this information carefully each time. Talk to your pediatrician regarding the use of this medicine in children. While this drug may be prescribed for children as young as 6 years for selected conditions, precautions do apply. Overdosage: If you think you have taken too much of this medicine contact a poison control center or emergency room at once. NOTE: This medicine is only for you. Do not share this medicine with others. What if I miss a dose? If you miss a dose, take it as soon as you can. If it is almost time for your next dose, take only that dose. Do not take double or extra doses. What may interact with this medicine? Do not take this medicine with any of the following medications: -MAOIs like Carbex, Eldepryl, Marplan, Nardil, and Parnate -other stimulant medicines for attention disorders, weight loss, or to stay awake This medicine may also interact with the following medications: -acetazolamide -ammonium chloride -antacids -ascorbic acid -atomoxetine -caffeine -certain medicines for blood pressure -certain medicines for depression, anxiety, or psychotic disturbances -certain medicines for diabetes -certain medicines for seizures like carbamazepine, phenobarbital, phenytoin -certain medicines for stomach problems like cimetidine, famotidine, omeprazole, lansoprazole -cold or allergy medicines -glutamic acid -lithium -meperidine -methenamine; sodium acid phosphate -narcotic medicines for pain -norepinephrine -phenothiazines like chlorpromazine, mesoridazine, prochlorperazine, thioridazine -sodium bicarbonate This list may not describe all possible interactions. Give your health care provider a list of all the medicines, herbs, non-prescription drugs, or dietary supplements you use. Also tell them if you smoke, drink alcohol, or use illegal drugs. Some items may interact with your medicine. What should I watch for while using this medicine? Visit  your doctor or health care   professional for regular checks on your progress. This prescription requires that you follow special procedures with your doctor and pharmacy. You will need to have a new written prescription from your doctor every time you need a refill. This medicine may affect your concentration, or hide signs of tiredness. Until you know how this medicine affects you, do not drive, ride a bicycle, use machinery, or do anything that needs mental alertness. Tell your doctor or health care professional if this medicine loses its effects, or if you feel you need to take more than the prescribed amount. Do not change the dosage without talking to your doctor or health care professional. Decreased appetite is a common side effect when starting this medicine. Eating small, frequent meals or snacks can help. Talk to your doctor if you continue to have poor eating habits. Height and weight growth of a child taking this medicine will be monitored closely. Do not take this medicine close to bedtime. It may prevent you from sleeping. If you are going to need surgery, an MRI, a CT scan, or other procedure, tell your doctor that you are taking this medicine. You may need to stop taking this medicine before the procedure. Tell your doctor or healthcare professional right away if you notice unexplained wounds on your fingers and toes while taking this medicine. You should also tell your healthcare provider if you experience numbness or pain, changes in the skin color, or sensitivity to temperature in your fingers or toes. What side effects may I notice from receiving this medicine? Side effects that you should report to your doctor or health care professional as soon as possible: -allergic reactions like skin rash, itching or hives, swelling of the face, lips, or tongue -changes in vision -chest pain or chest tightness -confusion, trouble speaking or understanding -fast, irregular heartbeat -fingers or  toes feel numb, cool, painful -hallucination, loss of contact with reality -high blood pressure -males: prolonged or painful erection -seizures -severe headaches -shortness of breath -suicidal thoughts or other mood changes -trouble walking, dizziness, loss of balance or coordination -uncontrollable head, mouth, neck, arm, or leg movements Side effects that usually do not require medical attention (report to your doctor or health care professional if they continue or are bothersome): -anxious -headache -loss of appetite -nausea, vomiting -trouble sleeping -weight loss This list may not describe all possible side effects. Call your doctor for medical advice about side effects. You may report side effects to FDA at 1-800-FDA-1088. Where should I keep my medicine? Keep out of the reach of children. This medicine can be abused. Keep your medicine in a safe place to protect it from theft. Do not share this medicine with anyone. Selling or giving away this medicine is dangerous and against the law. Store at room temperature between 15 and 30 degrees C (59 and 86 degrees F). Keep container tightly closed. Protect from light. Throw away any unused medicine after the expiration date. NOTE: This sheet is a summary. It may not cover all possible information. If you have questions about this medicine, talk to your doctor, pharmacist, or health care provider.  2018 Elsevier/Gold Standard (2014-07-02 18:22:45)  

## 2018-07-18 NOTE — Progress Notes (Signed)
[facility] [place], [place] [address]  Internal MEDICINE  Office Visit Note  Patient Name: Megan [name]  [id number]  [id number]  Date of Service: 07/18/2018  Chief Complaint  Patient presents with  . Anxiety  . ADD    medication refill 90 day supply     HPI  Pt here for follow upon anxiety, and ADD.  She denies any new issues with her anxiety.  She reports it is well controlled periodically using Xanax at bedtime.  She takes 30 mg of Adderall in the morning and 10 mg in the afternoon and like those refilled today.  She denies any side effects of the Adderall he reports good symptom management when taking.   Current Medication: Outpatient Encounter Medications as of 07/18/2018  Medication Sig Note  . ALPRAZolam (XANAX) 1 MG tablet Take 1 tablet (1 mg total) by mouth at bedtime as needed for anxiety or sleep.   Marland Kitchen amphetamine-dextroamphetamine (ADDERALL) 10 MG tablet Take 1 tablet (10 mg total) by mouth daily with breakfast.   . amphetamine-dextroamphetamine (ADDERALL) 30 MG tablet Take 1 tablet by mouth daily.   Marland Kitchen aspirin EC 325 MG tablet Take 325 mg by mouth as needed.   . valACYclovir (VALTREX) 500 MG tablet  03/06/2014: Received from: External Pharmacy  . Vitamin D, Ergocalciferol, (DRISDOL) 50000 units CAPS capsule TAKE 1 CAPSULE BY MOUTH EVERY 2 WEEKS   . [DISCONTINUED] amphetamine-dextroamphetamine (ADDERALL) 10 MG tablet Take 10 mg by mouth daily with breakfast.   . [DISCONTINUED] amphetamine-dextroamphetamine (ADDERALL) 30 MG tablet Take 1 tablet by mouth daily.   Derrill Memo ON 08/17/2018] amphetamine-dextroamphetamine (ADDERALL) 10 MG tablet Take 1 tablet (10 mg total) by mouth daily with breakfast.   . [START ON 09/16/2018] amphetamine-dextroamphetamine (ADDERALL) 10 MG tablet Take 1 tablet (10 mg total) by mouth daily with breakfast.   . [START ON 08/17/2018] amphetamine-dextroamphetamine (ADDERALL) 30 MG tablet Take 1 tablet by mouth daily.   Derrill Memo ON 09/16/2018] amphetamine-dextroamphetamine (ADDERALL) 30 MG tablet Take 1 tablet by mouth daily.   . [DISCONTINUED] amphetamine-dextroamphetamine (ADDERALL) 10 MG tablet Take one tab po around lunch   . [DISCONTINUED] amphetamine-dextroamphetamine (ADDERALL) 10 MG tablet Take 1 tablet (10 mg total) by mouth daily.   . [DISCONTINUED] amphetamine-dextroamphetamine (ADDERALL) 10 MG tablet Take 1 tablet (10 mg total) by mouth daily.   . [DISCONTINUED] amphetamine-dextroamphetamine (ADDERALL) 10 MG tablet Take 1 tablet (10 mg total) by mouth daily with breakfast.   . [DISCONTINUED] amphetamine-dextroamphetamine (ADDERALL) 30 MG tablet Take 1 tablet by mouth daily.   . [DISCONTINUED] amphetamine-dextroamphetamine (ADDERALL) 30 MG tablet Take 1 tablet by mouth daily.   . [DISCONTINUED] amphetamine-dextroamphetamine (ADDERALL) 30 MG tablet Take 1 tablet by mouth daily.   . [DISCONTINUED] amphetamine-dextroamphetamine (ADDERALL) 30 MG tablet Take 1 tablet by mouth daily.    No facility-administered encounter medications on file as of 07/18/2018.     Surgical History: Past Surgical History:  Procedure Laterality Date  . BREAST BIOPSY Left 2002   benign  . BUNIONECTOMY Right   . CESAREAN SECTION      Medical History: Past Medical History:  Diagnosis Date  . ADD (attention deficit disorder)   . Pre-hypertension     Family History: Family History  Problem Relation Age of Onset  . Lung cancer Father   . Diabetes Sister   . Breast cancer Neg Hx     Social History   Socioeconomic History  . Marital status: Married    Spouse  name: Not on file  . Number of children: Not on file  . Years of education: Not on file  . Highest education level: Not on file  Occupational History  . Not on file  Social Needs  . Financial resource strain: Not on file  . Food insecurity:    Worry: Not on file    Inability: Not on file  . Transportation needs:    Medical: Not on file    Non-medical:  Not on file  Tobacco Use  . Smoking status: Never Smoker  . Smokeless tobacco: Never Used  Substance and Sexual Activity  . Alcohol use: Yes    Comment: social  . Drug use: Not on file  . Sexual activity: Not on file  Lifestyle  . Physical activity:    Days per week: Not on file    Minutes per session: Not on file  . Stress: Not on file  Relationships  . Social connections:    Talks on phone: Not on file    Gets together: Not on file    Attends religious service: Not on file    Active member of club or organization: Not on file    Attends meetings of clubs or organizations: Not on file    Relationship status: Not on file  . Intimate partner violence:    Fear of current or ex partner: Not on file    Emotionally abused: Not on file    Physically abused: Not on file    Forced sexual activity: Not on file  Other Topics Concern  . Not on file  Social History Narrative  . Not on file      Review of Systems  Constitutional: Negative for chills, fatigue and unexpected weight change.  HENT: Negative for congestion, rhinorrhea, sneezing and sore throat.   Eyes: Negative for photophobia, pain and redness.  Respiratory: Negative for cough, chest tightness and shortness of breath.   Cardiovascular: Negative for chest pain and palpitations.  Gastrointestinal: Negative for abdominal pain, constipation, diarrhea, nausea and vomiting.  Endocrine: Negative.   Genitourinary: Negative for dysuria and frequency.  Musculoskeletal: Negative for arthralgias, back pain, joint swelling and neck pain.  Skin: Negative for rash.  Allergic/Immunologic: Negative.   Neurological: Negative for tremors and numbness.  Hematological: Negative for adenopathy. Does not bruise/bleed easily.  Psychiatric/Behavioral: Negative for behavioral problems and sleep disturbance. The patient is not nervous/anxious.     Vital Signs: BP (!) 142/98 (BP Location: Left Arm, Patient Position: Sitting, Cuff Size:  Normal)   Pulse (!) 108   Resp 16   Ht 4\' 10"  (1.473 m)   Wt 188 lb (85.3 kg)   SpO2 98%   BMI 39.29 kg/m    Physical Exam  Constitutional: She is oriented to person, place, and time. She appears well-developed and well-nourished. No distress.  HENT:  Head: Normocephalic and atraumatic.  Mouth/Throat: Oropharynx is clear and moist. No oropharyngeal exudate.  Eyes: Pupils are equal, round, and reactive to light. EOM are normal.  Neck: Normal range of motion. Neck supple. No JVD present. No tracheal deviation present. No thyromegaly present.  Cardiovascular: Normal rate, regular rhythm and normal heart sounds. Exam reveals no gallop and no friction rub.  No murmur heard. Pulmonary/Chest: Effort normal and breath sounds normal. No respiratory distress. She has no wheezes. She has no rales. She exhibits no tenderness.  Abdominal: Soft. There is no tenderness. There is no guarding.  Musculoskeletal: Normal range of motion.  Lymphadenopathy:  She has no cervical adenopathy.  Neurological: She is alert and oriented to person, place, and time. No cranial nerve deficit.  Skin: Skin is warm and dry. She is not diaphoretic.  Psychiatric: She has a normal mood and affect. Her behavior is normal. Judgment and thought content normal.  Nursing note and vitals reviewed.  Assessment/Plan: 1. History of long-term treatment with high-risk medication Patient's urine drug screen appropriate for current current medications.  Negative any illicit drugs. - POCT Urine Drug Screen  2. Decreased hearing, unspecified laterality Patient requesting ENT referral as she feels like her hearing has decreased. - Ambulatory referral to ENT  3. Attention deficit disorder (ADD) without hyperactivity Patient given 3 paper prescriptions for Adderall 30 mg as well as 3 paper prescriptions for Adderall 10 mg.  She reports she has had difficulty with her pharmacy filling the amount she needs and may need to send  prescriptions to different pharmacy next month.  I read the paper prescriptions with notes that said not to fill before certain dates this way she can present them to whatever pharmacy happens to have the 10 mg tablets when she needs them.  Refilled Controlled medications today. Reviewed risks and possible side effects associated with taking Stimulants. Combination of these drugs with other psychotropic medications could cause dizziness and drowsiness. Pt needs to Monitor symptoms and exercise caution in driving and operating heavy machinery to avoid damages to oneself, to others and to the surroundings. Patient verbalized understanding in this matter. Dependence and abuse for these drugs will be monitored closely. A Controlled substance policy and procedure is on file which allows [facility] to order a urine drug screen test at any visit. Patient understands and agrees with the plan.  4. Elevated blood pressure reading We discussed that her systolic blood pressure was again elevated.  She declined to start a medication and wants to work on her diet and exercise over the next 3 months.  We made a deal to start medications in 3 months if blood pressure is not improved.  5. Class 2 obesity without serious comorbidity with body mass index (BMI) of 39.0 to 39.9 in adult, unspecified obesity type Obesity Counseling: Risk Assessment: An assessment of behavioral risk factors was made today and includes lack of exercise sedentary lifestyle, lack of portion control and poor dietary habits.  Risk Modification Advice: She was counseled on portion control guidelines. Restricting daily caloric intake to. . The detrimental long term effects of obesity on her health and ongoing poor compliance was also discussed with the patient.  6. Hx of gestational diabetes mellitus, not currently pregnant Hemoglobin A1c 5.4 today. - POCT HgB A1C  General Counseling: Megan [name] verbalizes understanding of the findings  of todays visit and agrees with plan of treatment. I have discussed any further diagnostic evaluation that may be needed or ordered today. We also reviewed her medications today. she has been encouraged to call the office with any questions or concerns that should arise related to todays visit.    Orders Placed This Encounter  Procedures  . Ambulatory referral to ENT  . POCT Urine Drug Screen  . POCT HgB A1C    Meds ordered this encounter  Medications  . amphetamine-dextroamphetamine (ADDERALL) 30 MG tablet    Sig: Take 1 tablet by mouth daily.    Dispense:  30 tablet    Refill:  0  . amphetamine-dextroamphetamine (ADDERALL) 30 MG tablet    Sig: Take 1 tablet by mouth daily.  Dispense:  30 tablet    Refill:  0    DO NOT FILL BEFORE 08/17/2018  . amphetamine-dextroamphetamine (ADDERALL) 30 MG tablet    Sig: Take 1 tablet by mouth daily.    Dispense:  30 tablet    Refill:  0    DO NOT FILL BEFORE 09/16/2018  . amphetamine-dextroamphetamine (ADDERALL) 10 MG tablet    Sig: Take 1 tablet (10 mg total) by mouth daily with breakfast.    Dispense:  30 tablet    Refill:  0  . amphetamine-dextroamphetamine (ADDERALL) 10 MG tablet    Sig: Take 1 tablet (10 mg total) by mouth daily with breakfast.    Dispense:  30 tablet    Refill:  0    DO NOT FILL BEFORE 08/17/2018  . amphetamine-dextroamphetamine (ADDERALL) 10 MG tablet    Sig: Take 1 tablet (10 mg total) by mouth daily with breakfast.    Dispense:  30 tablet    Refill:  0    DO NOT FILL BEFORE 09/16/2018    Time spent: 25 Minutes   This patient was seen by Orson Gear AGNP-C in Collaboration with Dr Lavera Guise as a part of collaborative care agreement     Kendell Bane AGNP-C Internal medicine

## 2018-08-03 ENCOUNTER — Other Ambulatory Visit: Payer: Self-pay | Admitting: Otolaryngology

## 2018-08-03 DIAGNOSIS — D2320 Other benign neoplasm of skin of unspecified ear and external auricular canal: Secondary | ICD-10-CM

## 2018-08-16 ENCOUNTER — Ambulatory Visit: Payer: BLUE CROSS/BLUE SHIELD | Attending: Otolaryngology

## 2018-10-11 ENCOUNTER — Encounter: Payer: Self-pay | Admitting: Adult Health

## 2018-10-11 ENCOUNTER — Ambulatory Visit (INDEPENDENT_AMBULATORY_CARE_PROVIDER_SITE_OTHER): Payer: BLUE CROSS/BLUE SHIELD | Admitting: Adult Health

## 2018-10-11 VITALS — BP 130/82 | HR 112 | Resp 16 | Ht <= 58 in | Wt 186.0 lb

## 2018-10-11 DIAGNOSIS — F988 Other specified behavioral and emotional disorders with onset usually occurring in childhood and adolescence: Secondary | ICD-10-CM | POA: Diagnosis not present

## 2018-10-11 DIAGNOSIS — B009 Herpesviral infection, unspecified: Secondary | ICD-10-CM

## 2018-10-11 DIAGNOSIS — R000 Tachycardia, unspecified: Secondary | ICD-10-CM | POA: Diagnosis not present

## 2018-10-11 DIAGNOSIS — Z79899 Other long term (current) drug therapy: Secondary | ICD-10-CM | POA: Diagnosis not present

## 2018-10-11 MED ORDER — AMPHETAMINE-DEXTROAMPHETAMINE 30 MG PO TABS: 30.0000 mg | ORAL_TABLET | Freq: Every day | ORAL | 0 refills | Status: DC

## 2018-10-11 MED ORDER — AMPHETAMINE-DEXTROAMPHETAMINE 10 MG PO TABS: 10.0000 mg | ORAL_TABLET | Freq: Every day | ORAL | 0 refills | Status: DC

## 2018-10-11 MED ORDER — VALACYCLOVIR HCL 500 MG PO TABS: 500.0000 mg | ORAL_TABLET | Freq: Two times a day (BID) | ORAL | 2 refills | Status: DC

## 2018-10-11 NOTE — Patient Instructions (Signed)
Amphetamine; Dextroamphetamine tablets  What is this medicine?  AMPHETAMINE; DEXTROAMPHETAMINE(am FET a meen; dex troe am FET a meen) is used to treat attention-deficit hyperactivity disorder (ADHD). It may also be used for narcolepsy. Federal law prohibits giving this medicine to any person other than the person for whom it was prescribed. Do not share this medicine with anyone else.  This medicine may be used for other purposes; ask your health care provider or pharmacist if you have questions.  COMMON BRAND NAME(S): Adderall  What should I tell my health care provider before I take this medicine?  They need to know if you have any of these conditions:  -anxiety or panic attacks  -circulation problems in fingers and toes  -glaucoma  -hardening or blockages of the arteries or heart blood vessels  -heart disease or a heart defect  -high blood pressure  -history of a drug or alcohol abuse problem  -history of stroke  -kidney disease  -liver disease  -mental illness  -seizures  -suicidal thoughts, plans, or attempt; a previous suicide attempt by you or a family member  -thyroid disease  -Tourette's syndrome  -an unusual or allergic reaction to dextroamphetamine, other amphetamines, other medicines, foods, dyes, or preservatives  -pregnant or trying to get pregnant  -breast-feeding  How should I use this medicine?  Take this medicine by mouth with a glass of water. Follow the directions on the prescription label. Take your doses at regular intervals. Do not take your medicine more often than directed. Do not suddenly stop your medicine. You must gradually reduce the dose or you may feel withdrawal effects. Ask your doctor or health care professional for advice.  Talk to your pediatrician regarding the use of this medicine in children. Special care may be needed. While this drug may be prescribed for children as young as 3 years for selected conditions, precautions do apply.  Overdosage: If you think you have taken too  much of this medicine contact a poison control center or emergency room at once.  NOTE: This medicine is only for you. Do not share this medicine with others.  What if I miss a dose?  If you miss a dose, take it as soon as you can. If it is almost time for your next dose, take only that dose. Do not take double or extra doses.  What may interact with this medicine?  Do not take this medicine with any of the following medications:  -MAOIs like Carbex, Eldepryl, Marplan, Nardil, and Parnate  -other stimulant medicines for attention disorders  This medicine may also interact with the following medications:  -acetazolamide  -ammonium chloride  -antacids  -ascorbic acid  -atomoxetine  -caffeine  -certain medicines for blood pressure  -certain medicines for depression, anxiety, or psychotic disturbances  -certain medicines for seizures like carbamazepine, phenobarbital, phenytoin  -certain medicines for stomach problems like cimetidine, ranitidine, famotidine, esomeprazole, omeprazole, lansoprazole, pantoprazole  -lithium  -medicines for colds and breathing difficulties  -medicines for diabetes  -medicines or dietary supplements for weight loss or to stay awake  -methenamine  -narcotic medicines for pain  -quinidine  -ritonavir  -sodium bicarbonate  -St. John's wort  This list may not describe all possible interactions. Give your health care provider a list of all the medicines, herbs, non-prescription drugs, or dietary supplements you use. Also tell them if you smoke, drink alcohol, or use illegal drugs. Some items may interact with your medicine.  What should I watch for while using this medicine?    Visit your doctor or health care professional for regular checks on your progress. This prescription requires that you follow special procedures with your doctor and pharmacy. You will need to have a new written prescription from your doctor every time you need a refill.  This medicine may affect your concentration, or hide  signs of tiredness. Until you know how this medicine affects you, do not drive, ride a bicycle, use machinery, or do anything that needs mental alertness.  Tell your doctor or health care professional if this medicine loses its effects, or if you feel you need to take more than the prescribed amount. Do not change the dosage without talking to your doctor or health care professional.  Decreased appetite is a common side effect when starting this medicine. Eating small, frequent meals or snacks can help. Talk to your doctor if you continue to have poor eating habits. Height and weight growth of a child taking this medicine will be monitored closely.  Do not take this medicine close to bedtime. It may prevent you from sleeping.  If you are going to need surgery, a MRI, CT scan, or other procedure, tell your doctor that you are taking this medicine. You may need to stop taking this medicine before the procedure.  Tell your doctor or healthcare professional right away if you notice unexplained wounds on your fingers and toes while taking this medicine. You should also tell your healthcare provider if you experience numbness or pain, changes in the skin color, or sensitivity to temperature in your fingers or toes.  What side effects may I notice from receiving this medicine?  Side effects that you should report to your doctor or health care professional as soon as possible:  -allergic reactions like skin rash, itching or hives, swelling of the face, lips, or tongue  -anxious  -breathing problems  -changes in emotions or moods  -changes in vision  -chest pain or chest tightness  -fast, irregular heartbeat  -fingers or toes feel numb, cool, painful  -hallucination, loss of contact with reality  -high blood pressure  -males: prolonged or painful erection  -seizures  -signs and symptoms of serotonin syndrome like confusion, increased sweating, fever, tremor, stiff muscles, diarrhea  -signs and symptoms of a stroke like  changes in vision; confusion; trouble speaking or understanding; severe headaches; sudden numbness or weakness of the face, arm or leg; trouble walking; dizziness; loss of balance or coordination  -suicidal thoughts or other mood changes  -uncontrollable head, mouth, neck, arm, or leg movements  Side effects that usually do not require medical attention (report to your doctor or health care professional if they continue or are bothersome):  -dry mouth  -headache  -irritability  -loss of appetite  -nausea  -trouble sleeping  -weight loss  This list may not describe all possible side effects. Call your doctor for medical advice about side effects. You may report side effects to FDA at 1-800-FDA-1088.  Where should I keep my medicine?  Keep out of the reach of children. This medicine can be abused. Keep your medicine in a safe place to protect it from theft. Do not share this medicine with anyone. Selling or giving away this medicine is dangerous and against the law.  Store at room temperature between 15 and 30 degrees C (59 and 86 degrees F). Keep container tightly closed. Throw away any unused medicine after the expiration date. Dispose of properly. This medicine may cause accidental overdose and death if it is taken by   other adults, children, or pets. Mix any unused medicine with a substance like cat litter or coffee grounds. Then throw the medicine away in a sealed container like a sealed bag or a coffee can with a lid. Do not use the medicine after the expiration date.  NOTE: This sheet is a summary. It may not cover all possible information. If you have questions about this medicine, talk to your doctor, pharmacist, or health care provider.   2019 Elsevier/Gold Standard (2016-10-21 16:28:23)

## 2018-10-11 NOTE — Progress Notes (Signed)
[facility], [place] [address]  Internal MEDICINE  Office Visit Note  Patient Name: Megan [name]  [id number]  [id number]  Date of Service: 10/30/2018  Chief Complaint  Patient presents with  . ADD    HPI  Patient is here for follow-up on ADD.  She is currently taking 30 mg of Adderall in the morning and a 10 mg dose just after lunchtime.  She reports that this works well for her.  She denies any side effects of the medication.  At this visit she also requesting a refill on her Valtrex.  It is of note that the patient's heart rate was 112 bpm at the beginning of the visit.  She denied feeling any palpitations, chest pain, shortness of breath or other symptoms.   Current Medication: Outpatient Encounter Medications as of 10/11/2018  Medication Sig Note  . ALPRAZolam (XANAX) 1 MG tablet Take 1 tablet (1 mg total) by mouth at bedtime as needed for anxiety or sleep.   Marland Kitchen aspirin EC 325 MG tablet Take 325 mg by mouth as needed.   . valACYclovir (VALTREX) 500 MG tablet Take 1 tablet (500 mg total) by mouth 2 (two) times daily.   . Vitamin D, Ergocalciferol, (DRISDOL) 50000 units CAPS capsule TAKE 1 CAPSULE BY MOUTH EVERY 2 WEEKS   . [DISCONTINUED] amphetamine-dextroamphetamine (ADDERALL) 10 MG tablet Take 1 tablet (10 mg total) by mouth daily with breakfast.   . [DISCONTINUED] amphetamine-dextroamphetamine (ADDERALL) 30 MG tablet Take 1 tablet by mouth daily.   . [DISCONTINUED] valACYclovir (VALTREX) 500 MG tablet  03/06/2014: Received from: External Pharmacy  . amphetamine-dextroamphetamine (ADDERALL) 10 MG tablet Take 1 tablet (10 mg total) by mouth daily.   Derrill Memo ON 11/10/2018] amphetamine-dextroamphetamine (ADDERALL) 10 MG tablet Take 1 tablet (10 mg total) by mouth daily.   Derrill Memo ON 12/10/2018] amphetamine-dextroamphetamine (ADDERALL) 10 MG tablet Take 1 tablet (10 mg total) by mouth daily.   Marland Kitchen amphetamine-dextroamphetamine (ADDERALL) 30 MG  tablet Take 1 tablet by mouth daily.   Derrill Memo ON 11/10/2018] amphetamine-dextroamphetamine (ADDERALL) 30 MG tablet Take 1 tablet by mouth daily.   Derrill Memo ON 12/10/2018] amphetamine-dextroamphetamine (ADDERALL) 30 MG tablet Take 1 tablet by mouth daily.   . [DISCONTINUED] amphetamine-dextroamphetamine (ADDERALL) 10 MG tablet Take 1 tablet (10 mg total) by mouth daily with breakfast.   . [DISCONTINUED] amphetamine-dextroamphetamine (ADDERALL) 10 MG tablet Take 1 tablet (10 mg total) by mouth daily with breakfast.   . [DISCONTINUED] amphetamine-dextroamphetamine (ADDERALL) 30 MG tablet Take 1 tablet by mouth daily.   . [DISCONTINUED] amphetamine-dextroamphetamine (ADDERALL) 30 MG tablet Take 1 tablet by mouth daily.    No facility-administered encounter medications on file as of 10/11/2018.     Surgical History: Past Surgical History:  Procedure Laterality Date  . BREAST BIOPSY Left 2002   benign  . BUNIONECTOMY Right   . CESAREAN SECTION      Medical History: Past Medical History:  Diagnosis Date  . ADD (attention deficit disorder)   . Pre-hypertension     Family History: Family History  Problem Relation Age of Onset  . Lung cancer Father   . Diabetes Sister   . Breast cancer Neg Hx     Social History   Socioeconomic History  . Marital status: Married    Spouse name: Not on file  . Number of children: Not on file  . Years of education: Not on file  . Highest education level: Not on file  Occupational History  .  Not on file  Social Needs  . Financial resource strain: Not on file  . Food insecurity:    Worry: Not on file    Inability: Not on file  . Transportation needs:    Medical: Not on file    Non-medical: Not on file  Tobacco Use  . Smoking status: Never Smoker  . Smokeless tobacco: Never Used  Substance and Sexual Activity  . Alcohol use: Yes    Comment: social  . Drug use: Not on file  . Sexual activity: Not on file  Lifestyle  . Physical activity:     Days per week: Not on file    Minutes per session: Not on file  . Stress: Not on file  Relationships  . Social connections:    Talks on phone: Not on file    Gets together: Not on file    Attends religious service: Not on file    Active member of club or organization: Not on file    Attends meetings of clubs or organizations: Not on file    Relationship status: Not on file  . Intimate partner violence:    Fear of current or ex partner: Not on file    Emotionally abused: Not on file    Physically abused: Not on file    Forced sexual activity: Not on file  Other Topics Concern  . Not on file  Social History Narrative  . Not on file      Review of Systems  Constitutional: Negative for chills, fatigue and unexpected weight change.  HENT: Negative for congestion, rhinorrhea, sneezing and sore throat.   Eyes: Negative for photophobia, pain and redness.  Respiratory: Negative for cough, chest tightness and shortness of breath.   Cardiovascular: Negative for chest pain and palpitations.  Gastrointestinal: Negative for abdominal pain, constipation, diarrhea, nausea and vomiting.  Endocrine: Negative.   Genitourinary: Negative for dysuria and frequency.  Musculoskeletal: Negative for arthralgias, back pain, joint swelling and neck pain.  Skin: Negative for rash.  Allergic/Immunologic: Negative.   Neurological: Negative for tremors and numbness.  Hematological: Negative for adenopathy. Does not bruise/bleed easily.  Psychiatric/Behavioral: Negative for behavioral problems and sleep disturbance. The patient is not nervous/anxious.     Vital Signs: BP 130/82   Pulse (!) 112   Resp 16   Ht 4\' 10"  (1.473 m)   Wt 186 lb (84.4 kg)   SpO2 98%   BMI 38.87 kg/m    Physical Exam Vitals signs and nursing note reviewed.  Constitutional:      General: She is not in acute distress.    Appearance: She is well-developed. She is not diaphoretic.  HENT:     Head: Normocephalic and  atraumatic.     Mouth/Throat:     Pharynx: No oropharyngeal exudate.  Eyes:     Pupils: Pupils are equal, round, and reactive to light.  Neck:     Musculoskeletal: Normal range of motion and neck supple.     Thyroid: No thyromegaly.     Vascular: No JVD.     Trachea: No tracheal deviation.  Cardiovascular:     Rate and Rhythm: Normal rate and regular rhythm.     Heart sounds: Normal heart sounds. No murmur. No friction rub. No gallop.   Pulmonary:     Effort: Pulmonary effort is normal. No respiratory distress.     Breath sounds: Normal breath sounds. No wheezing or rales.  Chest:     Chest wall: No tenderness.  Abdominal:  Palpations: Abdomen is soft.     Tenderness: There is no abdominal tenderness. There is no guarding.  Musculoskeletal: Normal range of motion.  Lymphadenopathy:     Cervical: No cervical adenopathy.  Skin:    General: Skin is warm and dry.  Neurological:     Mental Status: She is alert and oriented to person, place, and time.     Cranial Nerves: No cranial nerve deficit.  Psychiatric:        Behavior: Behavior normal.        Thought Content: Thought content normal.        Judgment: Judgment normal.    Assessment/Plan: 1. Attention deficit disorder (ADD) without hyperactivity   - amphetamine-dextroamphetamine (ADDERALL) 10 MG tablet; Take 1 tablet (10 mg total) by mouth daily.  Dispense: 30 tablet; Refill: 0 - amphetamine-dextroamphetamine (ADDERALL) 30 MG tablet; Take 1 tablet by mouth daily.  Dispense: 30 tablet; Refill: 0 - amphetamine-dextroamphetamine (ADDERALL) 10 MG tablet; Take 1 tablet (10 mg total) by mouth daily.  Dispense: 30 tablet; Refill: 0 - amphetamine-dextroamphetamine (ADDERALL) 10 MG tablet; Take 1 tablet (10 mg total) by mouth daily.  Dispense: 60 tablet; Refill: 0 - amphetamine-dextroamphetamine (ADDERALL) 30 MG tablet; Take 1 tablet by mouth daily.  Dispense: 30 tablet; Refill: 0 - amphetamine-dextroamphetamine (ADDERALL) 30 MG  tablet; Take 1 tablet by mouth daily.  Dispense: 30 tablet; Refill: 0 Refilled Controlled medications today. Reviewed risks and possible side effects associated with taking Stimulants. Combination of these drugs with other psychotropic medications could cause dizziness and drowsiness. Pt needs to Monitor symptoms and exercise caution in driving and operating heavy machinery to avoid damages to oneself, to others and to the surroundings. Patient verbalized understanding in this matter. Dependence and abuse for these drugs will be monitored closely. A Controlled substance policy and procedure is on file which allows [facility] to order a urine drug screen test at any visit. Patient understands and agrees with the plan..  2. History of long-term treatment with high-risk medication No uDS performed at this visit. Will get at next visit.   3. Tachycardia, unspecified PT initially tachycardic at this visit.  However, her pulse rate normalized to 88 bpm during exam.   4. HSV (herpes simplex virus) infection Refilled patient's Valtrex prescription. - valACYclovir (VALTREX) 500 MG tablet; Take 1 tablet (500 mg total) by mouth 2 (two) times daily.  Dispense: 30 tablet; Refill: 2  General Counseling: Lauralee verbalizes understanding of the findings of todays visit and agrees with plan of treatment. I have discussed any further diagnostic evaluation that may be needed or ordered today. We also reviewed her medications today. she has been encouraged to call the office with any questions or concerns that should arise related to todays visit.    No orders of the defined types were placed in this encounter.   Meds ordered this encounter  Medications  . amphetamine-dextroamphetamine (ADDERALL) 10 MG tablet    Sig: Take 1 tablet (10 mg total) by mouth daily.    Dispense:  30 tablet    Refill:  0  . amphetamine-dextroamphetamine (ADDERALL) 30 MG tablet    Sig: Take 1 tablet by mouth daily.     Dispense:  30 tablet    Refill:  0  . amphetamine-dextroamphetamine (ADDERALL) 10 MG tablet    Sig: Take 1 tablet (10 mg total) by mouth daily.    Dispense:  30 tablet    Refill:  0    Do not fill before 11/10/18  .  amphetamine-dextroamphetamine (ADDERALL) 10 MG tablet    Sig: Take 1 tablet (10 mg total) by mouth daily.    Dispense:  60 tablet    Refill:  0    Do not fill before 12/10/18  . amphetamine-dextroamphetamine (ADDERALL) 30 MG tablet    Sig: Take 1 tablet by mouth daily.    Dispense:  30 tablet    Refill:  0    Do not fill before 11/10/18  . amphetamine-dextroamphetamine (ADDERALL) 30 MG tablet    Sig: Take 1 tablet by mouth daily.    Dispense:  30 tablet    Refill:  0    Do not fill before 12/10/18  . valACYclovir (VALTREX) 500 MG tablet    Sig: Take 1 tablet (500 mg total) by mouth 2 (two) times daily.    Dispense:  30 tablet    Refill:  2    Time spent: 25 Minutes   This patient was seen by Orson Gear AGNP-C in Collaboration with Dr Lavera Guise as a part of collaborative care agreement     Kendell Bane AGNP-C Internal medicine

## 2019-01-10 ENCOUNTER — Other Ambulatory Visit: Payer: Self-pay

## 2019-01-10 ENCOUNTER — Ambulatory Visit: Payer: BLUE CROSS/BLUE SHIELD | Admitting: Nurse Practitioner

## 2019-01-10 VITALS — Resp 16 | Ht 58.5 in | Wt 182.0 lb

## 2019-01-10 DIAGNOSIS — H00029 Hordeolum internum unspecified eye, unspecified eyelid: Secondary | ICD-10-CM | POA: Diagnosis not present

## 2019-01-10 DIAGNOSIS — F988 Other specified behavioral and emotional disorders with onset usually occurring in childhood and adolescence: Secondary | ICD-10-CM

## 2019-01-10 DIAGNOSIS — G479 Sleep disorder, unspecified: Secondary | ICD-10-CM

## 2019-01-10 MED ORDER — CIPROFLOXACIN HCL 0.3 % OP SOLN: 2.0000 [drp] | OPHTHALMIC | 0 refills | Status: DC

## 2019-01-10 MED ORDER — AMPHETAMINE-DEXTROAMPHETAMINE 10 MG PO TABS: 10.0000 mg | ORAL_TABLET | Freq: Every day | ORAL | 0 refills | Status: DC

## 2019-01-10 MED ORDER — AMPHETAMINE-DEXTROAMPHETAMINE 30 MG PO TABS: 30.0000 mg | ORAL_TABLET | Freq: Every day | ORAL | 0 refills | Status: DC

## 2019-01-10 MED ORDER — ALPRAZOLAM 1 MG PO TABS: 1.0000 mg | ORAL_TABLET | Freq: Every evening | ORAL | 0 refills | Status: DC | PRN

## 2019-01-10 NOTE — Progress Notes (Signed)
[facility] [place], [place] [address]  Internal MEDICINE  Telephone Visit  Patient Name: Megan [name]  [id number]  [id number]  Date of Service: 01/27/2019  I connected with the patient at 3:27pm by webcam and verified the patients identity using two identifiers.   I discussed the limitations, risks, security and privacy concerns of performing an evaluation and management service by webcam and the availability of in person appointments. I also discussed with the patient that there may be a patient responsible charge related to the service.  The patient expressed understanding and agrees to proceed.    Chief Complaint  Patient presents with  . Telephone Screen  . Telephone Assessment  . Medical Management of Chronic Issues    3 month follow up medication refills  . ADD    The patient has been contacted via webcam for follow up visit due to concerns for spread of novel coronavirus. She is currently taking 30 mg of Adderall in the morning and a 15 mg dose just after lunchtime.  She reports that this works well for her.  She denies any side effects of the medication such as palpitations or jitteriness. She states that she has sty in her eye. Very itchy and irritating. Has excess watering from the eye.       Current Medication: Outpatient Encounter Medications as of 01/10/2019  Medication Sig  . ALPRAZolam (XANAX) 1 MG tablet Take 1 tablet (1 mg total) by mouth at bedtime as needed for anxiety or sleep.  Marland Kitchen amphetamine-dextroamphetamine (ADDERALL) 10 MG tablet Take 1 tablet (10 mg total) by mouth daily.  Marland Kitchen amphetamine-dextroamphetamine (ADDERALL) 30 MG tablet Take 1 tablet by mouth daily.  Marland Kitchen aspirin EC 325 MG tablet Take 325 mg by mouth as needed.  . valACYclovir (VALTREX) 500 MG tablet Take 1 tablet (500 mg total) by mouth 2 (two) times daily.  . [DISCONTINUED] ALPRAZolam (XANAX) 1 MG tablet Take 1 tablet (1 mg total) by mouth at bedtime as needed for  anxiety or sleep.  . [DISCONTINUED] amphetamine-dextroamphetamine (ADDERALL) 10 MG tablet Take 1 tablet (10 mg total) by mouth daily.  . [DISCONTINUED] amphetamine-dextroamphetamine (ADDERALL) 30 MG tablet Take 1 tablet by mouth daily.  . ciprofloxacin (CILOXAN) 0.3 % ophthalmic solution Place 2 drops into both eyes every 4 (four) hours while awake.  . Vitamin D, Ergocalciferol, (DRISDOL) 50000 units CAPS capsule TAKE 1 CAPSULE BY MOUTH EVERY 2 WEEKS  . [DISCONTINUED] amphetamine-dextroamphetamine (ADDERALL) 10 MG tablet Take 1 tablet (10 mg total) by mouth daily.  . [DISCONTINUED] amphetamine-dextroamphetamine (ADDERALL) 10 MG tablet Take 1 tablet (10 mg total) by mouth daily.  . [DISCONTINUED] amphetamine-dextroamphetamine (ADDERALL) 30 MG tablet Take 1 tablet by mouth daily.  . [DISCONTINUED] amphetamine-dextroamphetamine (ADDERALL) 30 MG tablet Take 1 tablet by mouth daily.   No facility-administered encounter medications on file as of 01/10/2019.     Surgical History: Past Surgical History:  Procedure Laterality Date  . BREAST BIOPSY Left 2002   benign  . BUNIONECTOMY Right   . CESAREAN SECTION      Medical History: Past Medical History:  Diagnosis Date  . ADD (attention deficit disorder)   . Pre-hypertension     Family History: Family History  Problem Relation Age of Onset  . Lung cancer Father   . Diabetes Sister   . Breast cancer Neg Hx     Social History   Socioeconomic History  . Marital status: Married    Spouse name: Not on file  .  Number of children: Not on file  . Years of education: Not on file  . Highest education level: Not on file  Occupational History  . Not on file  Social Needs  . Financial resource strain: Not on file  . Food insecurity:    Worry: Not on file    Inability: Not on file  . Transportation needs:    Medical: Not on file    Non-medical: Not on file  Tobacco Use  . Smoking status: Never Smoker  . Smokeless tobacco: Never Used   Substance and Sexual Activity  . Alcohol use: Yes    Comment: social  . Drug use: Not on file  . Sexual activity: Not on file  Lifestyle  . Physical activity:    Days per week: Not on file    Minutes per session: Not on file  . Stress: Not on file  Relationships  . Social connections:    Talks on phone: Not on file    Gets together: Not on file    Attends religious service: Not on file    Active member of club or organization: Not on file    Attends meetings of clubs or organizations: Not on file    Relationship status: Not on file  . Intimate partner violence:    Fear of current or ex partner: Not on file    Emotionally abused: Not on file    Physically abused: Not on file    Forced sexual activity: Not on file  Other Topics Concern  . Not on file  Social History Narrative  . Not on file      Review of Systems  Constitutional: Negative for chills, fatigue and unexpected weight change.  HENT: Negative for congestion, rhinorrhea, sneezing and sore throat.   Eyes: Positive for pain, redness and itching. Negative for photophobia.  Respiratory: Negative for cough, chest tightness and shortness of breath.   Cardiovascular: Negative for chest pain and palpitations.  Gastrointestinal: Negative for abdominal pain, constipation, diarrhea, nausea and vomiting.  Musculoskeletal: Negative for arthralgias, back pain, joint swelling and neck pain.  Skin: Negative for rash.  Allergic/Immunologic: Negative for environmental allergies.  Neurological: Negative for dizziness, tremors, numbness and headaches.  Hematological: Negative for adenopathy. Does not bruise/bleed easily.  Psychiatric/Behavioral: Positive for decreased concentration. Negative for behavioral problems and sleep disturbance. The patient is nervous/anxious.     Today's Vitals   01/10/19 1507  Resp: 16  Weight: 182 lb (82.6 kg)  Height: 4' 10.5" (1.486 m)   Body mass index is 37.39  kg/m.  Observation/Objective:   The patient is alert and oriented. She is pleasant and answers all questions appropriately. Breathing is non-labored. She is in no acute distress at this time. She does have noted hordeolum of the upper lid of the right eye.    Assessment/Plan: 1. Hordeolum internum, unspecified laterality Insert ciprofloxacin eye drops into affected eye every four hours while awake.  - ciprofloxacin (CILOXAN) 0.3 % ophthalmic solution; Place 2 drops into both eyes every 4 (four) hours while awake.  Dispense: 5 mL; Refill: 0  2. Attention deficit disorder (ADD) without hyperactivity May continue adderall 30mg  every morning and adderall 10mg  every afternoon, both as needed. Ninety day prescriptions for both were sent to her pharmacy.  - amphetamine-dextroamphetamine (ADDERALL) 10 MG tablet; Take 1 tablet (10 mg total) by mouth daily.  Dispense: 90 tablet; Refill: 0 - amphetamine-dextroamphetamine (ADDERALL) 30 MG tablet; Take 1 tablet by mouth daily.  Dispense: 90 tablet; Refill:  0  3. Sleep disturbance May take alprazolam 1mg  at betime as needed for insomnia related to anxiety. Single, ninety day prescription sent to her pharmacy . - ALPRAZolam Duanne Moron) 1 MG tablet; Take 1 tablet (1 mg total) by mouth at bedtime as needed for anxiety or sleep.  Dispense: 90 tablet; Refill: 0   General Counseling: Aubrielle verbalizes understanding of the findings of today's phone visit and agrees with plan of treatment. I have discussed any further diagnostic evaluation that may be needed or ordered today. We also reviewed her medications today. she has been encouraged to call the office with any questions or concerns that should arise related to todays visit.   Refilled Controlled medications today. Reviewed risks and possible side effects associated with taking Stimulants. Combination of these drugs with other psychotropic medications could cause dizziness and drowsiness. Pt needs to Monitor  symptoms and exercise caution in driving and operating heavy machinery to avoid damages to oneself, to others and to the surroundings. Patient verbalized understanding in this matter. Dependence and abuse for these drugs will be monitored closely. A Controlled substance policy and procedure is on file which allows [facility] to order a urine drug screen test at any visit. Patient understands and agrees with the plan..  This patient was seen by Leretha Pol FNP Collaboration with Dr Lavera Guise as a part of collaborative care agreement  Meds ordered this encounter  Medications  . amphetamine-dextroamphetamine (ADDERALL) 10 MG tablet    Sig: Take 1 tablet (10 mg total) by mouth daily.    Dispense:  90 tablet    Refill:  0    Please fill as 90 day prescription.    Order Specific Question:   Supervising Provider    Answer:   Lavera Guise [[id number]]  . amphetamine-dextroamphetamine (ADDERALL) 30 MG tablet    Sig: Take 1 tablet by mouth daily.    Dispense:  90 tablet    Refill:  0    Order Specific Question:   Supervising Provider    Answer:   Lavera Guise [[id number]]  . ALPRAZolam (XANAX) 1 MG tablet    Sig: Take 1 tablet (1 mg total) by mouth at bedtime as needed for anxiety or sleep.    Dispense:  90 tablet    Refill:  0    Order Specific Question:   Supervising Provider    Answer:   Lavera Guise [[id number]]  . ciprofloxacin (CILOXAN) 0.3 % ophthalmic solution    Sig: Place 2 drops into both eyes every 4 (four) hours while awake.    Dispense:  5 mL    Refill:  0    Order Specific Question:   Supervising Provider    Answer:   Lavera Guise [[id number]]    Time spent: 32 Minutes    Dr Lavera Guise Internal medicine

## 2019-01-27 ENCOUNTER — Encounter: Payer: Self-pay | Admitting: Nurse Practitioner

## 2019-01-27 DIAGNOSIS — G479 Sleep disorder, unspecified: Secondary | ICD-10-CM | POA: Insufficient documentation

## 2019-01-27 DIAGNOSIS — H00029 Hordeolum internum unspecified eye, unspecified eyelid: Secondary | ICD-10-CM | POA: Insufficient documentation

## 2019-01-27 DIAGNOSIS — F988 Other specified behavioral and emotional disorders with onset usually occurring in childhood and adolescence: Secondary | ICD-10-CM | POA: Insufficient documentation

## 2019-03-12 NOTE — Telephone Encounter (Signed)
Nurse discussed with pt to transfer prescriptions its been completed.

## 2019-04-15 ENCOUNTER — Other Ambulatory Visit: Payer: Self-pay

## 2019-04-15 ENCOUNTER — Encounter: Payer: Self-pay | Admitting: Internal Medicine

## 2019-04-15 ENCOUNTER — Ambulatory Visit: Payer: BLUE CROSS/BLUE SHIELD | Admitting: Adult Health

## 2019-04-15 ENCOUNTER — Encounter: Payer: Self-pay | Admitting: Adult Health

## 2019-04-15 VITALS — BP 147/97 | HR 119

## 2019-04-15 DIAGNOSIS — G479 Sleep disorder, unspecified: Secondary | ICD-10-CM | POA: Diagnosis not present

## 2019-04-15 DIAGNOSIS — B009 Herpesviral infection, unspecified: Secondary | ICD-10-CM | POA: Diagnosis not present

## 2019-04-15 DIAGNOSIS — I10 Essential (primary) hypertension: Secondary | ICD-10-CM

## 2019-04-15 DIAGNOSIS — F988 Other specified behavioral and emotional disorders with onset usually occurring in childhood and adolescence: Secondary | ICD-10-CM

## 2019-04-15 MED ORDER — BISOPROLOL FUMARATE 5 MG PO TABS: 5.0000 mg | ORAL_TABLET | Freq: Every day | ORAL | 1 refills | Status: DC

## 2019-04-15 MED ORDER — AMPHETAMINE-DEXTROAMPHETAMINE 10 MG PO TABS: 10.0000 mg | ORAL_TABLET | Freq: Every day | ORAL | 0 refills | Status: DC

## 2019-04-15 MED ORDER — AMPHETAMINE-DEXTROAMPHETAMINE 30 MG PO TABS: 30.0000 mg | ORAL_TABLET | Freq: Every day | ORAL | 0 refills | Status: DC

## 2019-04-15 NOTE — Progress Notes (Signed)
[facility] [place], [place] [address]  Internal MEDICINE  Telephone Visit  Patient Name: Megan [name]  [id number]  [id number]  Date of Service: 04/15/2019  I connected with the patient at 155 by telephone and verified the patients identity using two identifiers.   I discussed the limitations, risks, security and privacy concerns of performing an evaluation and management service by telephone and the availability of in person appointments. I also discussed with the patient that there may be a patient responsible charge related to the service.  The patient expressed understanding and agrees to proceed.    Chief Complaint  Patient presents with  . Telephone Screen  . Medical Management of Chronic Issues    medication refill valtrex, adderall ,xanax  . Telephone Assessment    HPI  PT seen via video. PT is requesting refills on Valtrex, Adderall.  Pt's blood pressure is elevated at today's visit.  Her heart rate is also elevated.  Looking back at previous visits, this has been intermittent for her. She reports that she has been on Adderall for many years, and never had issues with her bp or pulse.  She has gained some weight, and after some discussion agreed to try low dose beta blockers.       Starting to bike Current Medication: Outpatient Encounter Medications as of 04/15/2019  Medication Sig  . ALPRAZolam (XANAX) 1 MG tablet Take 1 tablet (1 mg total) by mouth at bedtime as needed for anxiety or sleep.  Marland Kitchen amphetamine-dextroamphetamine (ADDERALL) 10 MG tablet Take 1 tablet (10 mg total) by mouth daily.  Marland Kitchen amphetamine-dextroamphetamine (ADDERALL) 30 MG tablet Take 1 tablet by mouth daily.  Marland Kitchen aspirin EC 325 MG tablet Take 325 mg by mouth as needed.  . ciprofloxacin (CILOXAN) 0.3 % ophthalmic solution Place 2 drops into both eyes every 4 (four) hours while awake.  . valACYclovir (VALTREX) 500 MG tablet Take 1 tablet (500 mg total) by mouth 2 (two) times  daily.  . Vitamin D, Ergocalciferol, (DRISDOL) 50000 units CAPS capsule TAKE 1 CAPSULE BY MOUTH EVERY 2 WEEKS   No facility-administered encounter medications on file as of 04/15/2019.     Surgical History: Past Surgical History:  Procedure Laterality Date  . BREAST BIOPSY Left 2002   benign  . BUNIONECTOMY Right   . CESAREAN SECTION      Medical History: Past Medical History:  Diagnosis Date  . ADD (attention deficit disorder)   . Pre-hypertension     Family History: Family History  Problem Relation Age of Onset  . Lung cancer Father   . Diabetes Sister   . Breast cancer Neg Hx     Social History   Socioeconomic History  . Marital status: Married    Spouse name: Not on file  . Number of children: Not on file  . Years of education: Not on file  . Highest education level: Not on file  Occupational History  . Not on file  Social Needs  . Financial resource strain: Not on file  . Food insecurity    Worry: Not on file    Inability: Not on file  . Transportation needs    Medical: Not on file    Non-medical: Not on file  Tobacco Use  . Smoking status: Never Smoker  . Smokeless tobacco: Never Used  Substance and Sexual Activity  . Alcohol use: Yes    Comment: social  . Drug use: Not on file  . Sexual activity: Not  on file  Lifestyle  . Physical activity    Days per week: Not on file    Minutes per session: Not on file  . Stress: Not on file  Relationships  . Social Herbalist on phone: Not on file    Gets together: Not on file    Attends religious service: Not on file    Active member of club or organization: Not on file    Attends meetings of clubs or organizations: Not on file    Relationship status: Not on file  . Intimate partner violence    Fear of current or ex partner: Not on file    Emotionally abused: Not on file    Physically abused: Not on file    Forced sexual activity: Not on file  Other Topics Concern  . Not on file  Social  History Narrative  . Not on file      Review of Systems  Constitutional: Negative for chills, fatigue and unexpected weight change.  HENT: Negative for congestion, rhinorrhea, sneezing and sore throat.   Eyes: Negative for photophobia, pain and redness.  Respiratory: Negative for cough, chest tightness and shortness of breath.   Cardiovascular: Negative for chest pain and palpitations.  Gastrointestinal: Negative for abdominal pain, constipation, diarrhea, nausea and vomiting.  Endocrine: Negative.   Genitourinary: Negative for dysuria and frequency.  Musculoskeletal: Negative for arthralgias, back pain, joint swelling and neck pain.  Skin: Negative for rash.  Allergic/Immunologic: Negative.   Neurological: Negative for tremors and numbness.  Hematological: Negative for adenopathy. Does not bruise/bleed easily.  Psychiatric/Behavioral: Negative for behavioral problems and sleep disturbance. The patient is not nervous/anxious.     Vital Signs: BP (!) 150/88   Pulse (!) 127    Observation/Objective:  Well appearing, speaking in full sentences.  NAD noted.    Assessment/Plan: 1. Essential hypertension Instructed patient to take bisoprolol 1/2 tablet daily, and increase to 1 tablet daily if bp/pulse did not improve.  - bisoprolol (ZEBETA) 5 MG tablet; Take 1 tablet (5 mg total) by mouth daily.  Dispense: 30 tablet; Refill: 1 Hypertension Counseling:   The following hypertensive lifestyle modification were recommended and discussed:  1. Limiting alcohol intake to less than 1 oz/day of ethanol:(24 oz of beer or 8 oz of wine or 2 oz of 100-proof whiskey). 2. Take baby ASA 81 mg daily. 3. Importance of regular aerobic exercise and losing weight. 4. Reduce dietary saturated fat and cholesterol intake for overall cardiovascular health. 5. Maintaining adequate dietary potassium, calcium, and magnesium intake. 6. Regular monitoring of the blood pressure. 7. Reduce sodium intake to  less than 100 mmol/day (less than 2.3 gm of sodium or less than 6 gm of sodium choride)   2. Attention deficit disorder (ADD) without hyperactivity Refilled Controlled medications today. Reviewed risks and possible side effects associated with taking Stimulants. Combination of these drugs with other psychotropic medications could cause dizziness and drowsiness. Pt needs to Monitor symptoms and exercise caution in driving and operating heavy machinery to avoid damages to oneself, to others and to the surroundings. Patient verbalized understanding in this matter. Dependence and abuse for these drugs will be monitored closely. A Controlled substance policy and procedure is on file which allows [facility] to order a urine drug screen test at any visit. Patient understands and agrees with the plan.. - amphetamine-dextroamphetamine (ADDERALL) 10 MG tablet; Take 1 tablet (10 mg total) by mouth daily.  Dispense: 30 tablet; Refill: 0 -  amphetamine-dextroamphetamine (ADDERALL) 30 MG tablet; Take 1 tablet by mouth daily.  Dispense: 30 tablet; Refill: 0 - amphetamine-dextroamphetamine (ADDERALL) 10 MG tablet; Take 1 tablet (10 mg total) by mouth daily.  Dispense: 30 tablet; Refill: 0 - amphetamine-dextroamphetamine (ADDERALL) 30 MG tablet; Take 1 tablet by mouth daily.  Dispense: 30 tablet; Refill: 0  3. Sleep disturbance PT is currently doing well, reports not needing Xanax refill at this time.   4. HSV (herpes simplex virus) infection PT has had a few break outs, and has a current refill on her valtrex.   General Counseling: diani jillson understanding of the findings of today's phone visit and agrees with plan of treatment. I have discussed any further diagnostic evaluation that may be needed or ordered today. We also reviewed her medications today. she has been encouraged to call the office with any questions or concerns that should arise related to todays visit.    No orders of the  defined types were placed in this encounter.   No orders of the defined types were placed in this encounter.   Time spent: [place] AGNP-C Internal medicine

## 2019-05-01 ENCOUNTER — Other Ambulatory Visit: Payer: Self-pay

## 2019-05-01 ENCOUNTER — Ambulatory Visit: Payer: BLUE CROSS/BLUE SHIELD | Admitting: Adult Health

## 2019-05-01 ENCOUNTER — Encounter: Payer: Self-pay | Admitting: Adult Health

## 2019-05-01 VITALS — BP 142/84 | HR 74

## 2019-05-01 DIAGNOSIS — B009 Herpesviral infection, unspecified: Secondary | ICD-10-CM

## 2019-05-01 DIAGNOSIS — F988 Other specified behavioral and emotional disorders with onset usually occurring in childhood and adolescence: Secondary | ICD-10-CM

## 2019-05-01 DIAGNOSIS — I10 Essential (primary) hypertension: Secondary | ICD-10-CM

## 2019-05-01 MED ORDER — VITAMIN D (ERGOCALCIFEROL) 1.25 MG (50000 UNIT) PO CAPS: 50000.0000 [IU] | ORAL_CAPSULE | ORAL | 0 refills | Status: DC

## 2019-05-01 MED ORDER — AMPHETAMINE-DEXTROAMPHETAMINE 10 MG PO TABS: 10.0000 mg | ORAL_TABLET | Freq: Every day | ORAL | 0 refills | Status: DC

## 2019-05-01 MED ORDER — AMPHETAMINE-DEXTROAMPHETAMINE 30 MG PO TABS: 30.0000 mg | ORAL_TABLET | Freq: Every day | ORAL | 0 refills | Status: DC

## 2019-05-01 NOTE — Progress Notes (Signed)
[facility] [place],  [address]  Internal MEDICINE  Telephone Visit  Patient Name: Megan [name]  [id number]  [id number]  Date of Service: 05/01/2019  I connected with the patient at 1242 by telephone and verified the patients identity using two identifiers.   I discussed the limitations, risks, security and privacy concerns of performing an evaluation and management service by telephone and the availability of in person appointments. I also discussed with the patient that there may be a patient responsible charge related to the service.  The patient expressed understanding and agrees to proceed.    Chief Complaint  Patient presents with  . Telephone Screen  . Hypertension    2 week follow up  . Telephone Assessment    HPI  Pt seen via video for follow up on HTN.  She reports she has been taking 5mg  of bisoprolol and has noticed her diastolic BP has improved.  She stopped her adderall for awhile, and her bp remained elevated. She denies any side effects.  She denies any bradycardia.    Current Medication: Outpatient Encounter Medications as of 05/01/2019  Medication Sig  . ALPRAZolam (XANAX) 1 MG tablet Take 1 tablet (1 mg total) by mouth at bedtime as needed for anxiety or sleep.  Marland Kitchen amphetamine-dextroamphetamine (ADDERALL) 10 MG tablet Take 1 tablet (10 mg total) by mouth daily.  Derrill Memo ON 05/16/2019] amphetamine-dextroamphetamine (ADDERALL) 10 MG tablet Take 1 tablet (10 mg total) by mouth daily.  Derrill Memo ON 05/15/2019] amphetamine-dextroamphetamine (ADDERALL) 30 MG tablet Take 1 tablet by mouth daily.  Derrill Memo ON 05/16/2019] amphetamine-dextroamphetamine (ADDERALL) 30 MG tablet Take 1 tablet by mouth daily.  Marland Kitchen aspirin EC 325 MG tablet Take 325 mg by mouth as needed.  . bisoprolol (ZEBETA) 5 MG tablet Take 1 tablet (5 mg total) by mouth daily.  . ciprofloxacin (CILOXAN) 0.3 % ophthalmic solution Place 2 drops into both eyes every 4 (four) hours  while awake.  . valACYclovir (VALTREX) 500 MG tablet Take 1 tablet (500 mg total) by mouth 2 (two) times daily.  . Vitamin D, Ergocalciferol, (DRISDOL) 1.25 MG (50000 UT) CAPS capsule Take 1 capsule (50,000 Units total) by mouth every 7 (seven) days.  . [DISCONTINUED] amphetamine-dextroamphetamine (ADDERALL) 10 MG tablet Take 1 tablet (10 mg total) by mouth daily.  . [DISCONTINUED] amphetamine-dextroamphetamine (ADDERALL) 30 MG tablet Take 1 tablet by mouth daily.  . [DISCONTINUED] Vitamin D, Ergocalciferol, (DRISDOL) 50000 units CAPS capsule TAKE 1 CAPSULE BY MOUTH EVERY 2 WEEKS   No facility-administered encounter medications on file as of 05/01/2019.     Surgical History: Past Surgical History:  Procedure Laterality Date  . BREAST BIOPSY Left 2002   benign  . BUNIONECTOMY Right   . CESAREAN SECTION      Medical History: Past Medical History:  Diagnosis Date  . ADD (attention deficit disorder)   . Pre-hypertension     Family History: Family History  Problem Relation Age of Onset  . Lung cancer Father   . Diabetes Sister   . Breast cancer Neg Hx     Social History   Socioeconomic History  . Marital status: Married    Spouse name: Not on file  . Number of children: Not on file  . Years of education: Not on file  . Highest education level: Not on file  Occupational History  . Not on file  Social Needs  . Financial resource strain: Not on file  . Food insecurity  Worry: Not on file    Inability: Not on file  . Transportation needs    Medical: Not on file    Non-medical: Not on file  Tobacco Use  . Smoking status: Never Smoker  . Smokeless tobacco: Never Used  Substance and Sexual Activity  . Alcohol use: Yes    Comment: social  . Drug use: Not on file  . Sexual activity: Not on file  Lifestyle  . Physical activity    Days per week: Not on file    Minutes per session: Not on file  . Stress: Not on file  Relationships  . Social Herbalist on  phone: Not on file    Gets together: Not on file    Attends religious service: Not on file    Active member of club or organization: Not on file    Attends meetings of clubs or organizations: Not on file    Relationship status: Not on file  . Intimate partner violence    Fear of current or ex partner: Not on file    Emotionally abused: Not on file    Physically abused: Not on file    Forced sexual activity: Not on file  Other Topics Concern  . Not on file  Social History Narrative  . Not on file      Review of Systems  Constitutional: Negative for chills, fatigue and unexpected weight change.  HENT: Negative for congestion, rhinorrhea, sneezing and sore throat.   Eyes: Negative for photophobia, pain and redness.  Respiratory: Negative for cough, chest tightness and shortness of breath.   Cardiovascular: Negative for chest pain and palpitations.  Gastrointestinal: Negative for abdominal pain, constipation, diarrhea, nausea and vomiting.  Endocrine: Negative.   Genitourinary: Negative for dysuria and frequency.  Musculoskeletal: Negative for arthralgias, back pain, joint swelling and neck pain.  Skin: Negative for rash.  Allergic/Immunologic: Negative.   Neurological: Negative for tremors and numbness.  Hematological: Negative for adenopathy. Does not bruise/bleed easily.  Psychiatric/Behavioral: Negative for behavioral problems and sleep disturbance. The patient is not nervous/anxious.     Vital Signs: BP (!) 142/84   Pulse 74    Observation/Objective: Well appearing, NAD noted.     Assessment/Plan: 1. Essential hypertension Slightly elevated, we discussed taking 1.5 tablets of bisoprolol, pt will increase dose if bp remains elevated. Continue present management.   2. Attention deficit disorder (ADD) without hyperactivity Refilled Controlled medications today. Reviewed risks and possible side effects associated with taking Stimulants. Combination of these drugs with  other psychotropic medications could cause dizziness and drowsiness. Pt needs to Monitor symptoms and exercise caution in driving and operating heavy machinery to avoid damages to oneself, to others and to the surroundings. Patient verbalized understanding in this matter. Dependence and abuse for these drugs will be monitored closely. A Controlled substance policy and procedure is on file which allows [facility] to order a urine drug screen test at any visit. Patient understands and agrees with the plan.. - amphetamine-dextroamphetamine (ADDERALL) 30 MG tablet; Take 1 tablet by mouth daily.  Dispense: 30 tablet; Refill: 0 - amphetamine-dextroamphetamine (ADDERALL) 10 MG tablet; Take 1 tablet (10 mg total) by mouth daily.  Dispense: 30 tablet; Refill: 0  3. HSV (herpes simplex virus) infection Stable, continue to use valtrex as prescribed.   General Counseling: saina waage understanding of the findings of today's phone visit and agrees with plan of treatment. I have discussed any further diagnostic evaluation that may be needed  or ordered today. We also reviewed her medications today. she has been encouraged to call the office with any questions or concerns that should arise related to todays visit.    No orders of the defined types were placed in this encounter.   Meds ordered this encounter  Medications  . Vitamin D, Ergocalciferol, (DRISDOL) 1.25 MG (50000 UT) CAPS capsule    Sig: Take 1 capsule (50,000 Units total) by mouth every 7 (seven) days.    Dispense:  10 capsule    Refill:  0  . amphetamine-dextroamphetamine (ADDERALL) 30 MG tablet    Sig: Take 1 tablet by mouth daily.    Dispense:  30 tablet    Refill:  0    Do not fill before 05/16/2019  . amphetamine-dextroamphetamine (ADDERALL) 10 MG tablet    Sig: Take 1 tablet (10 mg total) by mouth daily.    Dispense:  30 tablet    Refill:  0    Do not fill before 05/16/2019    Time spent: 14 Minutes    This  patient was seen by Orson Gear AGNP-C in Collaboration with Dr Lavera Guise as a part of collaborative care agreement Internal medicine

## 2019-06-03 ENCOUNTER — Other Ambulatory Visit: Payer: Self-pay

## 2019-06-03 DIAGNOSIS — I10 Essential (primary) hypertension: Secondary | ICD-10-CM

## 2019-06-03 MED ORDER — BISOPROLOL FUMARATE 5 MG PO TABS: 5.0000 mg | ORAL_TABLET | Freq: Every day | ORAL | 5 refills | Status: DC

## 2019-06-12 ENCOUNTER — Encounter: Payer: Self-pay | Admitting: Adult Health

## 2019-06-12 ENCOUNTER — Other Ambulatory Visit: Payer: Self-pay

## 2019-06-12 ENCOUNTER — Ambulatory Visit: Payer: BLUE CROSS/BLUE SHIELD | Admitting: Adult Health

## 2019-06-12 VITALS — BP 130/88 | HR 90 | Resp 16 | Ht 58.5 in | Wt 196.0 lb

## 2019-06-12 DIAGNOSIS — G479 Sleep disorder, unspecified: Secondary | ICD-10-CM | POA: Diagnosis not present

## 2019-06-12 DIAGNOSIS — F988 Other specified behavioral and emotional disorders with onset usually occurring in childhood and adolescence: Secondary | ICD-10-CM | POA: Diagnosis not present

## 2019-06-12 DIAGNOSIS — Z79899 Other long term (current) drug therapy: Secondary | ICD-10-CM | POA: Diagnosis not present

## 2019-06-12 DIAGNOSIS — Z6839 Body mass index (BMI) 39.0-39.9, adult: Secondary | ICD-10-CM

## 2019-06-12 DIAGNOSIS — I10 Essential (primary) hypertension: Secondary | ICD-10-CM

## 2019-06-12 DIAGNOSIS — E669 Obesity, unspecified: Secondary | ICD-10-CM

## 2019-06-12 LAB — POCT URINE DRUG SCREEN
POC Amphetamine UR: POSITIVE — AB
POC BENZODIAZEPINES UR: POSITIVE — AB
POC Barbiturate UR: NOT DETECTED
POC Cocaine UR: NOT DETECTED
POC Ecstasy UR: NOT DETECTED
POC Marijuana UR: POSITIVE — AB
POC Methadone UR: NOT DETECTED
POC Methamphetamine UR: NOT DETECTED
POC Opiate Ur: NOT DETECTED
POC Oxycodone UR: NOT DETECTED
POC PHENCYCLIDINE UR: NOT DETECTED
POC TRICYCLICS UR: NOT DETECTED

## 2019-06-12 MED ORDER — AMPHETAMINE-DEXTROAMPHETAMINE 10 MG PO TABS: 10.0000 mg | ORAL_TABLET | Freq: Every day | ORAL | 0 refills | Status: DC

## 2019-06-12 MED ORDER — ALPRAZOLAM 1 MG PO TABS: 1.0000 mg | ORAL_TABLET | Freq: Every evening | ORAL | 0 refills | Status: DC | PRN

## 2019-06-12 MED ORDER — AMPHETAMINE-DEXTROAMPHETAMINE 30 MG PO TABS: 30.0000 mg | ORAL_TABLET | Freq: Every day | ORAL | 0 refills | Status: DC

## 2019-06-12 NOTE — Progress Notes (Addendum)
[facility] [place], [place] [address]  Internal MEDICINE  Office Visit Note  Patient Name: Megan [name]  [id number]  [id number]  Date of Service: 06/20/2019  Chief Complaint  Patient presents with  . Medical Management of Chronic Issues  . ADD  . Anxiety  . Hypertension    HPI Patient here for follow-up on hypertension and ADD. Bisoprolol prescribed and has blood pressure well maintained. Denies headache, chest pain or dizziness. Self-monitors blood pressure at home and reports well maintained blood pressures. Needs refill for Adderall and Xanax, well controlling sleeping at night and ADD.  Current Medication: Outpatient Encounter Medications as of 06/12/2019  Medication Sig  . ALPRAZolam (XANAX) 1 MG tablet Take 1 tablet (1 mg total) by mouth at bedtime as needed for anxiety or sleep.  Marland Kitchen amphetamine-dextroamphetamine (ADDERALL) 10 MG tablet Take 1 tablet (10 mg total) by mouth daily.  Marland Kitchen amphetamine-dextroamphetamine (ADDERALL) 30 MG tablet Take 1 tablet by mouth daily.  Marland Kitchen aspirin EC 325 MG tablet Take 325 mg by mouth as needed.  . bisoprolol (ZEBETA) 5 MG tablet Take 1 tablet (5 mg total) by mouth daily.  . ciprofloxacin (CILOXAN) 0.3 % ophthalmic solution Place 2 drops into both eyes every 4 (four) hours while awake.  . valACYclovir (VALTREX) 500 MG tablet Take 1 tablet (500 mg total) by mouth 2 (two) times daily.  . Vitamin D, Ergocalciferol, (DRISDOL) 1.25 MG (50000 UT) CAPS capsule Take 1 capsule (50,000 Units total) by mouth every 7 (seven) days.  . [DISCONTINUED] ALPRAZolam (XANAX) 1 MG tablet Take 1 tablet (1 mg total) by mouth at bedtime as needed for anxiety or sleep.  . [DISCONTINUED] amphetamine-dextroamphetamine (ADDERALL) 10 MG tablet Take 1 tablet (10 mg total) by mouth daily.  . [DISCONTINUED] amphetamine-dextroamphetamine (ADDERALL) 30 MG tablet Take 1 tablet by mouth daily.  Derrill Memo ON 07/12/2019] amphetamine-dextroamphetamine  (ADDERALL) 10 MG tablet Take 1 tablet (10 mg total) by mouth daily.  Derrill Memo ON 07/12/2019] amphetamine-dextroamphetamine (ADDERALL) 30 MG tablet Take 1 tablet by mouth daily.  . [DISCONTINUED] amphetamine-dextroamphetamine (ADDERALL) 10 MG tablet Take 1 tablet (10 mg total) by mouth daily. (Patient not taking: Reported on 06/12/2019)  . [DISCONTINUED] amphetamine-dextroamphetamine (ADDERALL) 30 MG tablet Take 1 tablet by mouth daily. (Patient not taking: Reported on 06/12/2019)   No facility-administered encounter medications on file as of 06/12/2019.     Surgical History: Past Surgical History:  Procedure Laterality Date  . BREAST BIOPSY Left 2002   benign  . BUNIONECTOMY Right   . CESAREAN SECTION      Medical History: Past Medical History:  Diagnosis Date  . ADD (attention deficit disorder)   . Pre-hypertension     Family History: Family History  Problem Relation Age of Onset  . Lung cancer Father   . Diabetes Sister   . Breast cancer Neg Hx     Social History   Socioeconomic History  . Marital status: Married    Spouse name: Not on file  . Number of children: Not on file  . Years of education: Not on file  . Highest education level: Not on file  Occupational History  . Not on file  Social Needs  . Financial resource strain: Not on file  . Food insecurity    Worry: Not on file    Inability: Not on file  . Transportation needs    Medical: Not on file    Non-medical: Not on file  Tobacco Use  . Smoking  status: Never Smoker  . Smokeless tobacco: Never Used  Substance and Sexual Activity  . Alcohol use: Yes    Comment: social  . Drug use: Not on file  . Sexual activity: Not on file  Lifestyle  . Physical activity    Days per week: Not on file    Minutes per session: Not on file  . Stress: Not on file  Relationships  . Social Herbalist on phone: Not on file    Gets together: Not on file    Attends religious service: Not on file    Active  member of club or organization: Not on file    Attends meetings of clubs or organizations: Not on file    Relationship status: Not on file  . Intimate partner violence    Fear of current or ex partner: Not on file    Emotionally abused: Not on file    Physically abused: Not on file    Forced sexual activity: Not on file  Other Topics Concern  . Not on file  Social History Narrative  . Not on file      Review of Systems  Constitutional: Negative for chills, fatigue and unexpected weight change.  HENT: Negative for congestion, rhinorrhea, sneezing and sore throat.   Eyes: Negative for photophobia, pain and redness.  Respiratory: Negative for cough, chest tightness and shortness of breath.   Cardiovascular: Negative for chest pain and palpitations.  Gastrointestinal: Negative for abdominal pain, constipation, diarrhea, nausea and vomiting.  Endocrine: Negative.   Genitourinary: Negative for dysuria and frequency.  Musculoskeletal: Negative for arthralgias, back pain, joint swelling and neck pain.  Skin: Negative for rash.  Allergic/Immunologic: Negative.   Neurological: Negative for tremors and numbness.  Hematological: Negative for adenopathy. Does not bruise/bleed easily.  Psychiatric/Behavioral: Negative for behavioral problems and sleep disturbance. The patient is not nervous/anxious.     Vital Signs: BP 130/88   Pulse 90   Resp 16   Ht 4' 10.5" (1.486 m)   Wt 196 lb (88.9 kg)   SpO2 98%   BMI 40.27 kg/m    Physical Exam Vitals signs and nursing note reviewed.  Constitutional:      General: She is not in acute distress.    Appearance: She is well-developed. She is not diaphoretic.  HENT:     Head: Normocephalic and atraumatic.     Mouth/Throat:     Pharynx: No oropharyngeal exudate.  Eyes:     Pupils: Pupils are equal, round, and reactive to light.  Neck:     Musculoskeletal: Normal range of motion and neck supple.     Thyroid: No thyromegaly.     Vascular:  No JVD.     Trachea: No tracheal deviation.  Cardiovascular:     Rate and Rhythm: Normal rate and regular rhythm.     Heart sounds: Normal heart sounds. No murmur. No friction rub. No gallop.   Pulmonary:     Effort: Pulmonary effort is normal. No respiratory distress.     Breath sounds: Normal breath sounds. No wheezing or rales.  Chest:     Chest wall: No tenderness.  Abdominal:     Palpations: Abdomen is soft.     Tenderness: There is no abdominal tenderness. There is no guarding.  Musculoskeletal: Normal range of motion.  Lymphadenopathy:     Cervical: No cervical adenopathy.  Skin:    General: Skin is warm and dry.  Neurological:     Mental Status:  She is alert and oriented to person, place, and time.     Cranial Nerves: No cranial nerve deficit.  Psychiatric:        Behavior: Behavior normal.        Thought Content: Thought content normal.        Judgment: Judgment normal.        Assessment/Plan: 1. Encounter for long-term (current) use of high-risk medication UDS positive for Amphetamines and Benzodiazapine as prescribed Adderall and Xanax.  - POCT Urine Drug Screen  2. Attention deficit disorder (ADD) without hyperactivity Stable on current therapy, continue to monitor. Discussed possible negative side effects with taking stimulants and benzodiazepine together. Educated not to consume alcohol or any other CNS medications without consulting provider. Educated to not drive while taking benzodiazepine as medication can cause impairment. - amphetamine-dextroamphetamine (ADDERALL) 10 MG tablet; Take 1 tablet (10 mg total) by mouth daily.  Dispense: 30 tablet; Refill: 0 - amphetamine-dextroamphetamine (ADDERALL) 10 MG tablet; Take 1 tablet (10 mg total) by mouth daily.  Dispense: 30 tablet; Refill: 0 - amphetamine-dextroamphetamine (ADDERALL) 30 MG tablet; Take 1 tablet by mouth daily.  Dispense: 30 tablet; Refill: 0 - amphetamine-dextroamphetamine (ADDERALL) 30 MG tablet;  Take 1 tablet by mouth daily.  Dispense: 30 tablet; Refill: 0  3. Sleep disturbance Stable on current therapy, continue current therapy. - ALPRAZolam (XANAX) 1 MG tablet; Take 1 tablet (1 mg total) by mouth at bedtime as needed for anxiety or sleep.  Dispense: 90 tablet; Refill: 0  4. Essential hypertension Stable on bisoprolol, continue to monitor in office and self-monitoring at home.  5. Class 2 obesity without serious comorbidity with body mass index (BMI) of 39.0 to 39.9 in adult, unspecified obesity type Obesity Counseling: Risk Assessment: An assessment of behavioral risk factors was made today and includes lack of exercise sedentary lifestyle, lack of portion control and poor dietary habits.  Risk Modification Advice: She was counseled on portion control guidelines. Restricting daily caloric intake to. . The detrimental long term effects of obesity on her health and ongoing poor compliance was also discussed with the patient.  General Counseling: aida helson understanding of the findings of todays visit and agrees with plan of treatment. I have discussed any further diagnostic evaluation that may be needed or ordered today. We also reviewed her medications today. she has been encouraged to call the office with any questions or concerns that should arise related to todays visit.    Orders Placed This Encounter  Procedures  . POCT Urine Drug Screen    Meds ordered this encounter  Medications  . amphetamine-dextroamphetamine (ADDERALL) 10 MG tablet    Sig: Take 1 tablet (10 mg total) by mouth daily.    Dispense:  30 tablet    Refill:  0  . ALPRAZolam (XANAX) 1 MG tablet    Sig: Take 1 tablet (1 mg total) by mouth at bedtime as needed for anxiety or sleep.    Dispense:  90 tablet    Refill:  0  . amphetamine-dextroamphetamine (ADDERALL) 10 MG tablet    Sig: Take 1 tablet (10 mg total) by mouth daily.    Dispense:  30 tablet    Refill:  0    Do not refill before  07/12/2019  . amphetamine-dextroamphetamine (ADDERALL) 30 MG tablet    Sig: Take 1 tablet by mouth daily.    Dispense:  30 tablet    Refill:  0  . amphetamine-dextroamphetamine (ADDERALL) 30 MG tablet    Sig: Take 1  tablet by mouth daily.    Dispense:  30 tablet    Refill:  0    Do not refill before 07/12/2019    Time spent: 25 Minutes   This patient was seen by Orson Gear AGNP-C in Collaboration with Dr Lavera Guise as a part of collaborative care agreement     Kendell Bane AGNP-C Internal medicine

## 2019-07-08 ENCOUNTER — Other Ambulatory Visit: Payer: BLUE CROSS/BLUE SHIELD | Admitting: Nurse Practitioner

## 2019-08-05 ENCOUNTER — Telehealth: Payer: Self-pay

## 2019-08-05 NOTE — Telephone Encounter (Signed)
Confirmed appointment with patient. klh °

## 2019-08-07 ENCOUNTER — Ambulatory Visit: Payer: BLUE CROSS/BLUE SHIELD | Admitting: Adult Health

## 2019-08-12 ENCOUNTER — Telehealth: Payer: Self-pay

## 2019-08-12 NOTE — Telephone Encounter (Signed)
Billed patient missed appt fee 08/07/19. beth

## 2019-08-13 ENCOUNTER — Other Ambulatory Visit: Payer: Self-pay

## 2019-08-13 ENCOUNTER — Ambulatory Visit: Payer: BLUE CROSS/BLUE SHIELD | Admitting: Nurse Practitioner

## 2019-08-13 ENCOUNTER — Encounter: Payer: Self-pay | Admitting: Nurse Practitioner

## 2019-08-13 VITALS — BP 128/89 | Resp 16 | Ht 59.0 in | Wt 190.0 lb

## 2019-08-13 DIAGNOSIS — F988 Other specified behavioral and emotional disorders with onset usually occurring in childhood and adolescence: Secondary | ICD-10-CM | POA: Diagnosis not present

## 2019-08-13 DIAGNOSIS — I10 Essential (primary) hypertension: Secondary | ICD-10-CM | POA: Diagnosis not present

## 2019-08-13 DIAGNOSIS — Z20822 Contact with and (suspected) exposure to covid-19: Secondary | ICD-10-CM

## 2019-08-13 MED ORDER — AMPHETAMINE-DEXTROAMPHETAMINE 10 MG PO TABS: 10.0000 mg | ORAL_TABLET | Freq: Every day | ORAL | 0 refills | Status: DC

## 2019-08-13 MED ORDER — AMPHETAMINE-DEXTROAMPHETAMINE 30 MG PO TABS: 30.0000 mg | ORAL_TABLET | Freq: Every day | ORAL | 0 refills | Status: DC

## 2019-08-13 NOTE — Progress Notes (Signed)
[facility] [place], [place] [address]  Internal MEDICINE  Telephone Visit  Patient Name: Megan [name]  [id number]  [id number]  Date of Service: 08/13/2019  I connected with the patient at 9:28am by telephone and verified the patients identity using two identifiers.   I discussed the limitations, risks, security and privacy concerns of performing an evaluation and management service by telephone and the availability of in person appointments. I also discussed with the patient that there may be a patient responsible charge related to the service.  The patient expressed understanding and agrees to proceed.    Chief Complaint  Patient presents with  . Telephone Assessment  . Telephone Screen  . ADD  . Medication Refill    ADDERALL FOR UNTIL DEC 14 HER NEXT APPT     The patient has been contacted via telephone for follow up visit due to concerns for spread of novel coronavirus. The patient states that her husband manages a Engineer, civil (consulting) in town. Started to have a dry cough. Due to possible community exposure, she went for testing COVID 19 testing. She does need to have refills for her adderall. She currently takes adderall 30mg  in the morning and adderall 10mg  in the afternoon if needed. She states that this dosing helps to keep her focused and on track without negative side effects. She has appointment for annual wellness visit and pap smear 08/26/2019.      Current Medication: Outpatient Encounter Medications as of 08/13/2019  Medication Sig  . ALPRAZolam (XANAX) 1 MG tablet Take 1 tablet (1 mg total) by mouth at bedtime as needed for anxiety or sleep.  Marland Kitchen amphetamine-dextroamphetamine (ADDERALL) 10 MG tablet Take 1 tablet (10 mg total) by mouth daily.  Marland Kitchen amphetamine-dextroamphetamine (ADDERALL) 10 MG tablet Take 1 tablet (10 mg total) by mouth daily.  Marland Kitchen amphetamine-dextroamphetamine (ADDERALL) 30 MG tablet Take 1 tablet by mouth daily.  Marland Kitchen  amphetamine-dextroamphetamine (ADDERALL) 30 MG tablet Take 1 tablet by mouth daily.  Marland Kitchen aspirin EC 325 MG tablet Take 325 mg by mouth as needed.  . bisoprolol (ZEBETA) 5 MG tablet Take 1 tablet (5 mg total) by mouth daily.  . valACYclovir (VALTREX) 500 MG tablet Take 1 tablet (500 mg total) by mouth 2 (two) times daily.  . Vitamin D, Ergocalciferol, (DRISDOL) 1.25 MG (50000 UT) CAPS capsule Take 1 capsule (50,000 Units total) by mouth every 7 (seven) days.  . [DISCONTINUED] amphetamine-dextroamphetamine (ADDERALL) 10 MG tablet Take 1 tablet (10 mg total) by mouth daily.  . [DISCONTINUED] amphetamine-dextroamphetamine (ADDERALL) 30 MG tablet Take 1 tablet by mouth daily.  . [DISCONTINUED] ciprofloxacin (CILOXAN) 0.3 % ophthalmic solution Place 2 drops into both eyes every 4 (four) hours while awake.   No facility-administered encounter medications on file as of 08/13/2019.     Surgical History: Past Surgical History:  Procedure Laterality Date  . BREAST BIOPSY Left 2002   benign  . BUNIONECTOMY Right   . CESAREAN SECTION      Medical History: Past Medical History:  Diagnosis Date  . ADD (attention deficit disorder)   . Pre-hypertension     Family History: Family History  Problem Relation Age of Onset  . Lung cancer Father   . Diabetes Sister   . Breast cancer Neg Hx     Social History   Socioeconomic History  . Marital status: Married    Spouse name: Not on file  . Number of children: Not on file  . Years of education: Not  on file  . Highest education level: Not on file  Occupational History  . Not on file  Social Needs  . Financial resource strain: Not on file  . Food insecurity    Worry: Not on file    Inability: Not on file  . Transportation needs    Medical: Not on file    Non-medical: Not on file  Tobacco Use  . Smoking status: Never Smoker  . Smokeless tobacco: Never Used  Substance and Sexual Activity  . Alcohol use: Yes    Comment: social  . Drug use:  Not on file  . Sexual activity: Not on file  Lifestyle  . Physical activity    Days per week: Not on file    Minutes per session: Not on file  . Stress: Not on file  Relationships  . Social Herbalist on phone: Not on file    Gets together: Not on file    Attends religious service: Not on file    Active member of club or organization: Not on file    Attends meetings of clubs or organizations: Not on file    Relationship status: Not on file  . Intimate partner violence    Fear of current or ex partner: Not on file    Emotionally abused: Not on file    Physically abused: Not on file    Forced sexual activity: Not on file  Other Topics Concern  . Not on file  Social History Narrative  . Not on file      Review of Systems  Constitutional: Negative for activity change, chills, fatigue and unexpected weight change.  HENT: Negative for congestion, postnasal drip, rhinorrhea, sinus pain, sneezing and sore throat.   Respiratory: Negative for cough, chest tightness and shortness of breath.   Cardiovascular: Negative for chest pain and palpitations.  Gastrointestinal: Negative for abdominal pain, constipation, diarrhea, nausea and vomiting.  Genitourinary: Negative for dysuria and frequency.  Musculoskeletal: Negative for arthralgias, back pain, joint swelling and neck pain.  Skin: Negative for rash.  Allergic/Immunologic: Negative for environmental allergies.  Neurological: Negative for dizziness, tremors, numbness and headaches.  Hematological: Negative for adenopathy. Does not bruise/bleed easily.  Psychiatric/Behavioral: Positive for decreased concentration. Negative for behavioral problems (Depression), sleep disturbance and suicidal ideas. The patient is not nervous/anxious.     Today's Vitals   08/13/19 0857  BP: 128/89  Resp: 16  Weight: 190 lb (86.2 kg)  Height: 4\' 11"  (1.499 m)   Body mass index is 38.38 kg/m.  Observation/Objective:   The patient is  alert and oriented. She is pleasant and answers all questions appropriately. Breathing is non-labored. She is in no acute distress at this time.    Assessment/Plan: 1. Essential hypertension Stable. Continue bp medication as prescribed   2. Attention deficit disorder (ADD) without hyperactivity Single 30 day prescription sent for adderall 30mg  in AM and adderall 10mg  in afternoon. Encouraged patient to keep her scheduled appointment for wellness visit and pap smear.  - amphetamine-dextroamphetamine (ADDERALL) 30 MG tablet; Take 1 tablet by mouth daily.  Dispense: 30 tablet; Refill: 0 - amphetamine-dextroamphetamine (ADDERALL) 10 MG tablet; Take 1 tablet (10 mg total) by mouth daily.  Dispense: 30 tablet; Refill: 0  General Counseling: Megan [name] verbalizes understanding of the findings of today's phone visit and agrees with plan of treatment. I have discussed any further diagnostic evaluation that may be needed or ordered today. We also reviewed her medications today. she has been encouraged to  call the office with any questions or concerns that should arise related to todays visit.  Refilled Controlled medications today. Reviewed risks and possible side effects associated with taking Stimulants. Combination of these drugs with other psychotropic medications could cause dizziness and drowsiness. Pt needs to Monitor symptoms and exercise caution in driving and operating heavy machinery to avoid damages to oneself, to others and to the surroundings. Patient verbalized understanding in this matter. Dependence and abuse for these drugs will be monitored closely. A Controlled substance policy and procedure is on file which allows [facility] to order a urine drug screen test at any visit. Patient understands and agrees with the plan..  This patient was seen by Leretha Pol FNP Collaboration with Dr Lavera Guise as a part of collaborative care agreement  Meds ordered this encounter   Medications  . amphetamine-dextroamphetamine (ADDERALL) 30 MG tablet    Sig: Take 1 tablet by mouth daily.    Dispense:  30 tablet    Refill:  0    Order Specific Question:   Supervising Provider    Answer:   Lavera Guise [id number]  . amphetamine-dextroamphetamine (ADDERALL) 10 MG tablet    Sig: Take 1 tablet (10 mg total) by mouth daily.    Dispense:  30 tablet    Refill:  0    Order Specific Question:   Supervising Provider    Answer:   Lavera Guise [id number]    Time spent: 39 Minutes    Dr Lavera Guise Internal medicine

## 2019-08-15 LAB — NOVEL CORONAVIRUS, NAA: SARS-CoV-2, NAA: NOT DETECTED

## 2019-08-22 ENCOUNTER — Telehealth: Payer: Self-pay

## 2019-08-22 NOTE — Telephone Encounter (Signed)
CONFIRMED AND SCREENED FOR 08-26-19 OV. 

## 2019-08-26 ENCOUNTER — Ambulatory Visit (INDEPENDENT_AMBULATORY_CARE_PROVIDER_SITE_OTHER): Payer: BLUE CROSS/BLUE SHIELD | Admitting: Nurse Practitioner

## 2019-08-26 ENCOUNTER — Encounter: Payer: Self-pay | Admitting: Nurse Practitioner

## 2019-08-26 ENCOUNTER — Other Ambulatory Visit: Payer: Self-pay

## 2019-08-26 VITALS — BP 141/98 | HR 80 | Temp 97.4°F | Resp 16 | Ht 59.0 in | Wt 197.0 lb

## 2019-08-26 DIAGNOSIS — Z0001 Encounter for general adult medical examination with abnormal findings: Secondary | ICD-10-CM

## 2019-08-26 DIAGNOSIS — Z1231 Encounter for screening mammogram for malignant neoplasm of breast: Secondary | ICD-10-CM

## 2019-08-26 DIAGNOSIS — F988 Other specified behavioral and emotional disorders with onset usually occurring in childhood and adolescence: Secondary | ICD-10-CM

## 2019-08-26 DIAGNOSIS — H00029 Hordeolum internum unspecified eye, unspecified eyelid: Secondary | ICD-10-CM | POA: Diagnosis not present

## 2019-08-26 DIAGNOSIS — Z124 Encounter for screening for malignant neoplasm of cervix: Secondary | ICD-10-CM

## 2019-08-26 DIAGNOSIS — R635 Abnormal weight gain: Secondary | ICD-10-CM

## 2019-08-26 DIAGNOSIS — E559 Vitamin D deficiency, unspecified: Secondary | ICD-10-CM

## 2019-08-26 DIAGNOSIS — I10 Essential (primary) hypertension: Secondary | ICD-10-CM

## 2019-08-26 DIAGNOSIS — R300 Dysuria: Secondary | ICD-10-CM

## 2019-08-26 MED ORDER — AMPHETAMINE-DEXTROAMPHETAMINE 10 MG PO TABS: 10.0000 mg | ORAL_TABLET | Freq: Every day | ORAL | 0 refills | Status: DC

## 2019-08-26 MED ORDER — AMPHETAMINE-DEXTROAMPHETAMINE 30 MG PO TABS: 30.0000 mg | ORAL_TABLET | Freq: Every day | ORAL | 0 refills | Status: DC

## 2019-08-26 MED ORDER — CIPROFLOXACIN HCL 0.3 % OP SOLN: 1.0000 [drp] | OPHTHALMIC | 0 refills | Status: DC

## 2019-08-26 MED ORDER — VITAMIN D (ERGOCALCIFEROL) 1.25 MG (50000 UNIT) PO CAPS: 50000.0000 [IU] | ORAL_CAPSULE | ORAL | 0 refills | Status: DC

## 2019-08-26 NOTE — Progress Notes (Signed)
[facility] [place], [place] [address]  Internal MEDICINE  Office Visit Note  Patient Name: Megan [name]  [id number]  [id number]  Date of Service: 08/28/2019   Pt is here for routine health maintenance examination   Chief Complaint  Patient presents with  . Annual Exam  . Gynecologic Exam  . Hypertension    would like to try new bp med  . ADD  . Arthritis    right thumb at joint, started using voltaren for the past couple days      The patient is here for health maintenance exam and pap smear. She states that she has had a very difficult time with weight management, especially since [employer] closed down due to COVID 19. She states that she has really had a difficult time getting in routine exercise without classes. She feels like she needs to outward motivation to help her stay accountable. She is due to have routine fasting labs. She states that she is insulin resistant and had gestational diabetes during her last pregnancy. She is also due to have a screening mammogram.    Current Medication: Outpatient Encounter Medications as of 08/26/2019  Medication Sig  . ALPRAZolam (XANAX) 1 MG tablet Take 1 tablet (1 mg total) by mouth at bedtime as needed for anxiety or sleep.  Marland Kitchen amphetamine-dextroamphetamine (ADDERALL) 10 MG tablet Take 1 tablet (10 mg total) by mouth daily.  Marland Kitchen amphetamine-dextroamphetamine (ADDERALL) 30 MG tablet Take 1 tablet by mouth daily.  Marland Kitchen aspirin EC 325 MG tablet Take 325 mg by mouth as needed.  . bisoprolol (ZEBETA) 5 MG tablet Take 1 tablet (5 mg total) by mouth daily.  . valACYclovir (VALTREX) 500 MG tablet Take 1 tablet (500 mg total) by mouth 2 (two) times daily.  . Vitamin D, Ergocalciferol, (DRISDOL) 1.25 MG (50000 UT) CAPS capsule Take 1 capsule (50,000 Units total) by mouth every 7 (seven) days.  . [DISCONTINUED] amphetamine-dextroamphetamine (ADDERALL) 10 MG tablet Take 1 tablet (10 mg total) by mouth daily.   . [DISCONTINUED] amphetamine-dextroamphetamine (ADDERALL) 10 MG tablet Take 1 tablet (10 mg total) by mouth daily.  . [DISCONTINUED] amphetamine-dextroamphetamine (ADDERALL) 10 MG tablet Take 1 tablet (10 mg total) by mouth daily.  . [DISCONTINUED] amphetamine-dextroamphetamine (ADDERALL) 10 MG tablet Take 1 tablet (10 mg total) by mouth daily.  . [DISCONTINUED] amphetamine-dextroamphetamine (ADDERALL) 30 MG tablet Take 1 tablet by mouth daily.  . [DISCONTINUED] amphetamine-dextroamphetamine (ADDERALL) 30 MG tablet Take 1 tablet by mouth daily.  . [DISCONTINUED] amphetamine-dextroamphetamine (ADDERALL) 30 MG tablet Take 1 tablet by mouth daily.  . [DISCONTINUED] amphetamine-dextroamphetamine (ADDERALL) 30 MG tablet Take 1 tablet by mouth daily.  . [DISCONTINUED] Vitamin D, Ergocalciferol, (DRISDOL) 1.25 MG (50000 UT) CAPS capsule Take 1 capsule (50,000 Units total) by mouth every 7 (seven) days.  . ciprofloxacin (CILOXAN) 0.3 % ophthalmic solution Place 1 drop into both eyes every 2 (two) hours. Administer 1 drop, every 2 hours, while awake, for 2 days. Then 1 drop, every 4 hours, while awake, for the next 5 days.   No facility-administered encounter medications on file as of 08/26/2019.    Surgical History: Past Surgical History:  Procedure Laterality Date  . BREAST BIOPSY Left 2002   benign  . BUNIONECTOMY Right   . CESAREAN SECTION      Medical History: Past Medical History:  Diagnosis Date  . ADD (attention deficit disorder)   . Pre-hypertension     Family History: Family History  Problem Relation Age of  Onset  . Lung cancer Father   . Diabetes Sister   . Breast cancer Neg Hx       Review of Systems  Constitutional: Negative for activity change, chills, fatigue and unexpected weight change.       Has gained about 15 pounds over the past few months  HENT: Negative for congestion, postnasal drip, rhinorrhea, sneezing and sore throat.   Respiratory: Negative for cough,  chest tightness, shortness of breath and wheezing.   Cardiovascular: Negative for chest pain and palpitations.  Gastrointestinal: Negative for abdominal pain, constipation, diarrhea, nausea and vomiting.       Increased intermittent acid reflux.   Endocrine: Negative for cold intolerance, heat intolerance, polydipsia and polyuria.  Genitourinary: Negative for dysuria, flank pain and frequency.       Patient has mirena IUD  Musculoskeletal: Positive for arthralgias. Negative for back pain, joint swelling and neck pain.       Pain in the fingers and thumb of the right hand   Skin: Negative for rash.  Allergic/Immunologic: Negative for environmental allergies.  Neurological: Negative for dizziness, tremors, numbness and headaches.  Hematological: Negative for adenopathy. Does not bruise/bleed easily.  Psychiatric/Behavioral: Positive for decreased concentration. Negative for behavioral problems (Depression), sleep disturbance and suicidal ideas. The patient is not nervous/anxious.      Today's Vitals   08/26/19 0859  BP: (!) 141/98  Pulse: 80  Resp: 16  Temp: (!) 97.4 F (36.3 C)  SpO2: 99%  Weight: 197 lb (89.4 kg)  Height: 4\' 11"  (1.499 m)   Body mass index is 39.79 kg/m.  Physical Exam Vitals and nursing note reviewed.  Constitutional:      General: She is not in acute distress.    Appearance: Normal appearance. She is well-developed. She is obese. She is not diaphoretic.  HENT:     Head: Normocephalic and atraumatic.     Nose: Nose normal.     Mouth/Throat:     Pharynx: No oropharyngeal exudate.  Eyes:     Pupils: Pupils are equal, round, and reactive to light.  Neck:     Thyroid: No thyromegaly.     Vascular: No JVD.     Trachea: No tracheal deviation.  Cardiovascular:     Rate and Rhythm: Normal rate and regular rhythm.     Heart sounds: Normal heart sounds. No murmur. No friction rub. No gallop.   Pulmonary:     Effort: Pulmonary effort is normal. No respiratory  distress.     Breath sounds: No wheezing or rales.  Chest:     Chest wall: No tenderness.     Breasts:        Right: Normal. No swelling, bleeding, inverted nipple, mass, nipple discharge, skin change or tenderness.        Left: Normal. No swelling, bleeding, inverted nipple, mass, nipple discharge, skin change or tenderness.  Abdominal:     General: Bowel sounds are normal.     Palpations: Abdomen is soft.  Genitourinary:    General: Normal vulva.     Exam position: Supine.     Labia:        Right: No rash or tenderness.        Left: No rash or tenderness.      Vagina: Normal. No vaginal discharge, erythema, tenderness or bleeding.     Cervix: No discharge or friability.     Uterus: Normal. Not enlarged and not tender.      Adnexa: Right adnexa normal  and left adnexa normal.     Comments: No tenderness, masses, or organomeglay present during bimanual exam. Mild rectal prolapse present.  Musculoskeletal:        General: Normal range of motion.     Cervical back: Normal range of motion and neck supple.  Lymphadenopathy:     Cervical: No cervical adenopathy.     Upper Body:     Right upper body: No axillary adenopathy.     Left upper body: No axillary adenopathy.  Skin:    General: Skin is warm and dry.  Neurological:     Mental Status: She is alert and oriented to person, place, and time.     Cranial Nerves: No cranial nerve deficit.  Psychiatric:        Behavior: Behavior normal.        Thought Content: Thought content normal.        Judgment: Judgment normal.      LABS: Recent Results (from the past 2160 hour(s))  POCT Urine Drug Screen     Status: Abnormal   Collection Time: 06/12/19 10:52 AM  Result Value Ref Range   POC METHAMPHETAMINE UR None Detected None Detected   POC Opiate Ur None Detected None Detected   POC Barbiturate UR None Detected None Detected   POC Amphetamine UR Positive (A) None Detected   POC Oxycodone UR None Detected None Detected   POC  Cocaine UR None Detected None Detected   POC Ecstasy UR None Detected None Detected   POC TRICYCLICS UR None Detected None Detected   POC PHENCYCLIDINE UR None Detected None Detected   POC MARIJUANA UR Positive (A) None Detected   POC METHADONE UR None Detected None Detected   POC BENZODIAZEPINES UR Positive (A) None Detected   URINE TEMPERATURE     POC DRUG SCREEN OXIDANTS URINE     POC SPECIFIC GRAVITY URINE     POC PH URINE     Methylenedioxyamphetamine    Novel Coronavirus, NAA ([facility])     Status: None   Collection Time: 08/13/19  9:33 AM   Specimen: Nasopharyngeal(NP) swabs in vial transport medium   NASOPHARYNGE  TESTING  Result Value Ref Range   SARS-CoV-2, NAA Not Detected Not Detected    Comment: This nucleic acid amplification test was developed and its performance characteristics determined by [employer]. Nucleic acid amplification tests include PCR and TMA. This test has not been FDA cleared or approved. This test has been authorized by FDA under an Emergency Use Authorization (EUA). This test is only authorized for the duration of time the declaration that circumstances exist justifying the authorization of the emergency use of in vitro diagnostic tests for detection of SARS-CoV-2 virus and/or diagnosis of COVID-19 infection under section 564(b)(1) of the Act, 21 U.S.C. [id number]) (1), unless the authorization is terminated or revoked sooner. When diagnostic testing is negative, the possibility of a false negative result should be considered in the context of a patient's recent exposures and the presence of clinical signs and symptoms consistent with COVID-19. An individual without symptoms of COVID-19 and who is not shedding SARS-CoV-2 virus would  expect to have a negative (not detected) result in this assay.     Assessment/Plan: 1. Encounter for general adult medical examination with abnormal findings Annual wellness visit with pap smear today.    2. Essential hypertension Stable. Continue bp medication as prescribed   3. Hordeolum internum, unspecified laterality Ciprofloxacin eye drops may be used as needed for sty development.  -  ciprofloxacin (CILOXAN) 0.3 % ophthalmic solution; Place 1 drop into both eyes every 2 (two) hours. Administer 1 drop, every 2 hours, while awake, for 2 days. Then 1 drop, every 4 hours, while awake, for the next 5 days.  Dispense: 5 mL; Refill: 0  4. Attention deficit disorder (ADD) without hyperactivity May continue to take adderall 30mg  in AM and 10mg  in pm. Three 30 day prescriptions for both were sent to her pharmacy. Dates are 08/26/2019, 09/24/2019, and 10/23/2019 - amphetamine-dextroamphetamine (ADDERALL) 10 MG tablet; Take 1 tablet (10 mg total) by mouth daily.  Dispense: 30 tablet; Refill: 0 - amphetamine-dextroamphetamine (ADDERALL) 30 MG tablet; Take 1 tablet by mouth daily.  Dispense: 30 tablet; Refill: 0  5. Vitamin D deficiency Drisdol 50000iu weekly.  - Vitamin D, Ergocalciferol, (DRISDOL) 1.25 MG (50000 UT) CAPS capsule; Take 1 capsule (50,000 Units total) by mouth every 7 (seven) days.  Dispense: 10 capsule; Refill: 0  6. Abnormal weight gain Discussed diet and exercise to help improve metabolism and lose weight.   7. Encounter for screening mammogram for malignant neoplasm of breast - scrrening mammo; Future  8. Routine cervical smear - Pap IG and HPV (high risk) DNA detection    General Counseling: Kalin verbalizes understanding of the findings of todays visit and agrees with plan of treatment. I have discussed any further diagnostic evaluation that may be needed or ordered today. We also reviewed her medications today. she has been encouraged to call the office with any questions or concerns that should arise related to todays visit.    Counseling:  This patient was seen by Leretha Pol FNP Collaboration with Dr Lavera Guise as a part of collaborative care  agreement  Orders Placed This Encounter  Procedures  . scrrening mammo    Meds ordered this encounter  Medications  . DISCONTD: amphetamine-dextroamphetamine (ADDERALL) 10 MG tablet    Sig: Take 1 tablet (10 mg total) by mouth daily.    Dispense:  30 tablet    Refill:  0    Fill after 1230/2020    Order Specific Question:   Supervising Provider    Answer:   Lavera Guise [id number]  . DISCONTD: amphetamine-dextroamphetamine (ADDERALL) 30 MG tablet    Sig: Take 1 tablet by mouth daily.    Dispense:  30 tablet    Refill:  0    Fill after 09/11/2019    Order Specific Question:   Supervising Provider    Answer:   Lavera Guise [id number]  . Vitamin D, Ergocalciferol, (DRISDOL) 1.25 MG (50000 UT) CAPS capsule    Sig: Take 1 capsule (50,000 Units total) by mouth every 7 (seven) days.    Dispense:  10 capsule    Refill:  0    Order Specific Question:   Supervising Provider    Answer:   Lavera Guise [id number]  . DISCONTD: amphetamine-dextroamphetamine (ADDERALL) 30 MG tablet    Sig: Take 1 tablet by mouth daily.    Dispense:  30 tablet    Refill:  0    Fill after 10/10/2019    Order Specific Question:   Supervising Provider    Answer:   Lavera Guise [id number]  . DISCONTD: amphetamine-dextroamphetamine (ADDERALL) 10 MG tablet    Sig: Take 1 tablet (10 mg total) by mouth daily.    Dispense:  30 tablet    Refill:  0    Fill after 10/10/2019    Order Specific Question:   Supervising Provider  Answer:   Lavera Guise [place]  . ciprofloxacin (CILOXAN) 0.3 % ophthalmic solution    Sig: Place 1 drop into both eyes every 2 (two) hours. Administer 1 drop, every 2 hours, while awake, for 2 days. Then 1 drop, every 4 hours, while awake, for the next 5 days.    Dispense:  5 mL    Refill:  0    Order Specific Question:   Supervising Provider    Answer:   Lavera Guise [id number]  . amphetamine-dextroamphetamine (ADDERALL) 10 MG tablet    Sig: Take 1 tablet (10 mg total) by mouth daily.    Dispense:  30  tablet    Refill:  0    Fill after 11/08/2019    Order Specific Question:   Supervising Provider    Answer:   Lavera Guise [id number]  . amphetamine-dextroamphetamine (ADDERALL) 30 MG tablet    Sig: Take 1 tablet by mouth daily.    Dispense:  30 tablet    Refill:  0    Fill after 11/08/2019    Order Specific Question:   Supervising Provider    Answer:   Lavera Guise [id number]    Time spent: [place], MD  Internal Medicine

## 2019-08-28 DIAGNOSIS — Z1231 Encounter for screening mammogram for malignant neoplasm of breast: Secondary | ICD-10-CM | POA: Insufficient documentation

## 2019-08-28 DIAGNOSIS — Z0001 Encounter for general adult medical examination with abnormal findings: Secondary | ICD-10-CM | POA: Insufficient documentation

## 2019-08-28 DIAGNOSIS — E559 Vitamin D deficiency, unspecified: Secondary | ICD-10-CM | POA: Insufficient documentation

## 2019-08-28 DIAGNOSIS — R635 Abnormal weight gain: Secondary | ICD-10-CM | POA: Insufficient documentation

## 2019-08-28 DIAGNOSIS — Z124 Encounter for screening for malignant neoplasm of cervix: Secondary | ICD-10-CM | POA: Insufficient documentation

## 2019-08-28 DIAGNOSIS — R300 Dysuria: Secondary | ICD-10-CM | POA: Insufficient documentation

## 2019-08-30 LAB — PAP IG AND HPV HIGH-RISK: HPV, high-risk: NEGATIVE

## 2019-10-08 ENCOUNTER — Other Ambulatory Visit: Payer: Self-pay

## 2019-10-08 DIAGNOSIS — I10 Essential (primary) hypertension: Secondary | ICD-10-CM

## 2019-10-08 MED ORDER — BISOPROLOL FUMARATE 5 MG PO TABS: 5.0000 mg | ORAL_TABLET | Freq: Every day | ORAL | 5 refills | Status: DC

## 2019-10-14 ENCOUNTER — Ambulatory Visit: Payer: Managed Care, Other (non HMO) | Attending: Internal Medicine

## 2019-10-14 DIAGNOSIS — Z20822 Contact with and (suspected) exposure to covid-19: Secondary | ICD-10-CM

## 2019-10-15 LAB — NOVEL CORONAVIRUS, NAA: SARS-CoV-2, NAA: NOT DETECTED

## 2019-10-24 ENCOUNTER — Other Ambulatory Visit: Payer: Self-pay

## 2019-10-24 DIAGNOSIS — E559 Vitamin D deficiency, unspecified: Secondary | ICD-10-CM

## 2019-10-25 ENCOUNTER — Telehealth: Payer: Self-pay

## 2019-10-25 NOTE — Telephone Encounter (Signed)
Faxed phar for vitamin D refills pt can take OTC 2000 IU daily as per [employer]

## 2019-11-21 ENCOUNTER — Telehealth: Payer: Self-pay

## 2019-11-21 NOTE — Telephone Encounter (Signed)
CONFIRMED AND SCREENED FOR 11-25-19 OV. 

## 2019-11-25 ENCOUNTER — Encounter: Payer: Self-pay | Admitting: Nurse Practitioner

## 2019-11-25 ENCOUNTER — Ambulatory Visit: Payer: Managed Care, Other (non HMO) | Admitting: Nurse Practitioner

## 2019-11-25 ENCOUNTER — Other Ambulatory Visit: Payer: Self-pay

## 2019-11-25 VITALS — BP 131/81 | HR 79 | Temp 96.8°F | Ht 59.0 in | Wt 200.0 lb

## 2019-11-25 DIAGNOSIS — G479 Sleep disorder, unspecified: Secondary | ICD-10-CM | POA: Diagnosis not present

## 2019-11-25 DIAGNOSIS — F988 Other specified behavioral and emotional disorders with onset usually occurring in childhood and adolescence: Secondary | ICD-10-CM

## 2019-11-25 DIAGNOSIS — I10 Essential (primary) hypertension: Secondary | ICD-10-CM | POA: Diagnosis not present

## 2019-11-25 MED ORDER — AMPHETAMINE-DEXTROAMPHETAMINE 10 MG PO TABS: 10.0000 mg | ORAL_TABLET | Freq: Every day | ORAL | 0 refills | Status: DC

## 2019-11-25 MED ORDER — AMPHETAMINE-DEXTROAMPHETAMINE 30 MG PO TABS: 30.0000 mg | ORAL_TABLET | Freq: Every day | ORAL | 0 refills | Status: DC

## 2019-11-25 MED ORDER — ALPRAZOLAM 1 MG PO TABS: 1.0000 mg | ORAL_TABLET | Freq: Every evening | ORAL | 2 refills | Status: DC | PRN

## 2019-11-25 NOTE — Progress Notes (Signed)
[facility] [place], [place] [address]  Internal MEDICINE  Office Visit Note  Patient Name: Megan [name]  [id number]  PD:[id number]  Date of Service: 12/01/2019  Chief Complaint  Patient presents with  . Hypertension  . ADD  . Medication Refill    adderall, xanax and vitamin d, bisoprolol, valtrex    The patient is here for routine follow up visit. She states that she has had some increased issues with insomnia recently. Has restarted taking alprazolam at bedtime to help her with sleep. She states that she is taking this approximately three nights out of the week. She is needing to have this refilled today. She is researching other, non-habit forming medication to help with sleep. She is currently taking 30 mg of Adderall in the morning and a 15 mg dose just after lunchtime.  She reports that this works well for her.  She denies any side effects of the medication such as palpitations or jitteriness.      Current Medication: Outpatient Encounter Medications as of 11/25/2019  Medication Sig  . ALPRAZolam (XANAX) 1 MG tablet Take 1 tablet (1 mg total) by mouth at bedtime as needed for anxiety or sleep.  Marland Kitchen amphetamine-dextroamphetamine (ADDERALL) 10 MG tablet Take 1 tablet (10 mg total) by mouth daily.  Marland Kitchen amphetamine-dextroamphetamine (ADDERALL) 30 MG tablet Take 1 tablet by mouth daily.  Marland Kitchen aspirin EC 325 MG tablet Take 325 mg by mouth as needed.  . bisoprolol (ZEBETA) 5 MG tablet Take 1 tablet (5 mg total) by mouth daily.  . ciprofloxacin (CILOXAN) 0.3 % ophthalmic solution Place 1 drop into both eyes every 2 (two) hours. Administer 1 drop, every 2 hours, while awake, for 2 days. Then 1 drop, every 4 hours, while awake, for the next 5 days.  . valACYclovir (VALTREX) 500 MG tablet Take 1 tablet (500 mg total) by mouth 2 (two) times daily.  . Vitamin D, Ergocalciferol, (DRISDOL) 1.25 MG (50000 UT) CAPS capsule Take 1 capsule (50,000 Units total) by mouth every  7 (seven) days.  . [DISCONTINUED] ALPRAZolam (XANAX) 1 MG tablet Take 1 tablet (1 mg total) by mouth at bedtime as needed for anxiety or sleep.  . [DISCONTINUED] amphetamine-dextroamphetamine (ADDERALL) 10 MG tablet Take 1 tablet (10 mg total) by mouth daily.  . [DISCONTINUED] amphetamine-dextroamphetamine (ADDERALL) 10 MG tablet Take 1 tablet (10 mg total) by mouth daily.  . [DISCONTINUED] amphetamine-dextroamphetamine (ADDERALL) 10 MG tablet Take 1 tablet (10 mg total) by mouth daily.  . [DISCONTINUED] amphetamine-dextroamphetamine (ADDERALL) 30 MG tablet Take 1 tablet by mouth daily.  . [DISCONTINUED] amphetamine-dextroamphetamine (ADDERALL) 30 MG tablet Take 1 tablet by mouth daily.  . [DISCONTINUED] amphetamine-dextroamphetamine (ADDERALL) 30 MG tablet Take 1 tablet by mouth daily.   No facility-administered encounter medications on file as of 11/25/2019.    Surgical History: Past Surgical History:  Procedure Laterality Date  . BREAST BIOPSY Left 2002   benign  . BUNIONECTOMY Right   . CESAREAN SECTION      Medical History: Past Medical History:  Diagnosis Date  . ADD (attention deficit disorder)   . Pre-hypertension     Family History: Family History  Problem Relation Age of Onset  . Lung cancer Father   . Diabetes Sister   . Breast cancer Neg Hx     Social History   Socioeconomic History  . Marital status: Married    Spouse name: Not on file  . Number of children: Not on file  . Years of  education: Not on file  . Highest education level: Not on file  Occupational History  . Not on file  Tobacco Use  . Smoking status: Never Smoker  . Smokeless tobacco: Never Used  Substance and Sexual Activity  . Alcohol use: Yes    Comment: social  . Drug use: Yes    Types: Marijuana    Comment: once in a while   . Sexual activity: Not on file  Other Topics Concern  . Not on file  Social History Narrative  . Not on file   Social Determinants of Health   Financial  Resource Strain:   . Difficulty of Paying Living Expenses:   Food Insecurity:   . Worried About Charity fundraiser in the Last Year:   . Arboriculturist in the Last Year:   Transportation Needs:   . Film/video editor (Medical):   Marland Kitchen Lack of Transportation (Non-Medical):   Physical Activity:   . Days of Exercise per Week:   . Minutes of Exercise per Session:   Stress:   . Feeling of Stress :   Social Connections:   . Frequency of Communication with Friends and Family:   . Frequency of Social Gatherings with Friends and Family:   . Attends Religious Services:   . Active Member of Clubs or Organizations:   . Attends Archivist Meetings:   Marland Kitchen Marital Status:   Intimate Partner Violence:   . Fear of Current or Ex-Partner:   . Emotionally Abused:   Marland Kitchen Physically Abused:   . Sexually Abused:       Review of Systems  Constitutional: Negative for activity change, chills, fatigue and unexpected weight change.  HENT: Negative for congestion, postnasal drip, rhinorrhea, sinus pain, sneezing and sore throat.   Respiratory: Negative for cough, chest tightness, shortness of breath and wheezing.   Cardiovascular: Negative for chest pain and palpitations.  Gastrointestinal: Negative for abdominal pain, constipation, diarrhea, nausea and vomiting.  Endocrine: Negative for cold intolerance, heat intolerance, polydipsia and polyuria.  Musculoskeletal: Negative for arthralgias, back pain, joint swelling and neck pain.  Skin: Negative for rash.  Allergic/Immunologic: Negative for environmental allergies.  Neurological: Negative for dizziness, tremors, numbness and headaches.  Hematological: Negative for adenopathy. Does not bruise/bleed easily.  Psychiatric/Behavioral: Positive for decreased concentration and sleep disturbance. Negative for behavioral problems (Depression) and suicidal ideas. The patient is not nervous/anxious.     Today's Vitals   11/25/19 1429  BP: 131/81   Pulse: 79  Temp: (!) 96.8 F (36 C)  SpO2: 94%  Weight: 200 lb (90.7 kg)  Height: 4\' 11"  (1.499 m)   Body mass index is 40.4 kg/m.  Physical Exam Vitals and nursing note reviewed.  Constitutional:      General: She is not in acute distress.    Appearance: Normal appearance. She is well-developed. She is not diaphoretic.  HENT:     Head: Normocephalic and atraumatic.     Nose: Nose normal.     Mouth/Throat:     Pharynx: No oropharyngeal exudate.  Eyes:     Pupils: Pupils are equal, round, and reactive to light.  Neck:     Thyroid: No thyromegaly.     Vascular: No carotid bruit or JVD.     Trachea: No tracheal deviation.  Cardiovascular:     Rate and Rhythm: Normal rate and regular rhythm.     Heart sounds: Normal heart sounds. No murmur. No friction rub. No gallop.   Pulmonary:  Effort: Pulmonary effort is normal. No respiratory distress.     Breath sounds: Normal breath sounds. No wheezing or rales.  Chest:     Chest wall: No tenderness.  Abdominal:     Palpations: Abdomen is soft.  Musculoskeletal:        General: Normal range of motion.     Cervical back: Normal range of motion and neck supple.  Lymphadenopathy:     Cervical: No cervical adenopathy.  Skin:    General: Skin is warm and dry.  Neurological:     Mental Status: She is alert and oriented to person, place, and time.     Cranial Nerves: No cranial nerve deficit.  Psychiatric:        Attention and Perception: Attention and perception normal.        Mood and Affect: Affect normal. Mood is anxious.        Speech: Speech normal.        Behavior: Behavior normal.        Thought Content: Thought content normal.        Cognition and Memory: Cognition and memory normal.        Judgment: Judgment normal.    Assessment/Plan: 1. Essential hypertension Stable. Continue bp medication a sprescribed   2. Sleep disturbance May take alprazolam 1mg  at bedtime as needed for acute anxiety/insomnia. A new  prescription was sent to her pharmacy.  - ALPRAZolam Duanne Moron) 1 MG tablet; Take 1 tablet (1 mg total) by mouth at bedtime as needed for anxiety or sleep.  Dispense: 30 tablet; Refill: 2  3. Attention deficit disorder (ADD) without hyperactivity May continue to take adderall 30mg  tablets in the morning and 10mg  in the afternoons. Three 30 day prescriptions were sent to her pharmacy today. Dates are 11/25/2019, 12/24/2019, and 02/21/2020 - amphetamine-dextroamphetamine (ADDERALL) 10 MG tablet; Take 1 tablet (10 mg total) by mouth daily.  Dispense: 30 tablet; Refill: 0 - amphetamine-dextroamphetamine (ADDERALL) 30 MG tablet; Take 1 tablet by mouth daily.  Dispense: 30 tablet; Refill: 0  General Counseling: Anyelin verbalizes understanding of the findings of todays visit and agrees with plan of treatment. I have discussed any further diagnostic evaluation that may be needed or ordered today. We also reviewed her medications today. she has been encouraged to call the office with any questions or concerns that should arise related to todays visit.  Refilled Controlled medications today. Reviewed risks and possible side effects associated with taking Stimulants. Combination of these drugs with other psychotropic medications could cause dizziness and drowsiness. Pt needs to Monitor symptoms and exercise caution in driving and operating heavy machinery to avoid damages to oneself, to others and to the surroundings. Patient verbalized understanding in this matter. Dependence and abuse for these drugs will be monitored closely. A Controlled substance policy and procedure is on file which allows [facility] to order a urine drug screen test at any visit. Patient understands and agrees with the plan..  This patient was seen by Leretha Pol FNP Collaboration with Dr Lavera Guise as a part of collaborative care agreement  Meds ordered this encounter  Medications  . ALPRAZolam (XANAX) 1 MG tablet    Sig:  Take 1 tablet (1 mg total) by mouth at bedtime as needed for anxiety or sleep.    Dispense:  30 tablet    Refill:  2    Order Specific Question:   Supervising Provider    Answer:   Lavera Guise [id number]  . DISCONTD:  amphetamine-dextroamphetamine (ADDERALL) 10 MG tablet    Sig: Take 1 tablet (10 mg total) by mouth daily.    Dispense:  30 tablet    Refill:  0    Order Specific Question:   Supervising Provider    Answer:   Lavera Guise [id number]  . DISCONTD: amphetamine-dextroamphetamine (ADDERALL) 30 MG tablet    Sig: Take 1 tablet by mouth daily.    Dispense:  30 tablet    Refill:  0    Order Specific Question:   Supervising Provider    Answer:   Lavera Guise [id number]  . DISCONTD: amphetamine-dextroamphetamine (ADDERALL) 30 MG tablet    Sig: Take 1 tablet by mouth daily.    Dispense:  30 tablet    Refill:  0    Fill after 12/24/2019    Order Specific Question:   Supervising Provider    Answer:   Lavera Guise [id number]  . DISCONTD: amphetamine-dextroamphetamine (ADDERALL) 10 MG tablet    Sig: Take 1 tablet (10 mg total) by mouth daily.    Dispense:  30 tablet    Refill:  0    Fill after 12/24/2019    Order Specific Question:   Supervising Provider    Answer:   Lavera Guise [id number]  . amphetamine-dextroamphetamine (ADDERALL) 10 MG tablet    Sig: Take 1 tablet (10 mg total) by mouth daily.    Dispense:  30 tablet    Refill:  0    Fill after 01/21/2020    Order Specific Question:   Supervising Provider    Answer:   Lavera Guise [id number]  . amphetamine-dextroamphetamine (ADDERALL) 30 MG tablet    Sig: Take 1 tablet by mouth daily.    Dispense:  30 tablet    Refill:  0    Fill after 01/21/2020    Order Specific Question:   Supervising Provider    Answer:   Lavera Guise [id number]    Total time spent: 30 Minutes   Time spent includes review of chart, medications, test results, and follow up plan with the patient.      Dr Lavera Guise Internal medicine

## 2019-12-27 ENCOUNTER — Other Ambulatory Visit: Payer: Self-pay

## 2020-02-03 ENCOUNTER — Other Ambulatory Visit: Payer: Self-pay

## 2020-02-21 ENCOUNTER — Telehealth: Payer: Self-pay

## 2020-02-21 NOTE — Telephone Encounter (Signed)
Confirmed appointment on 02/25/2020 and screened for covid. [facility]  

## 2020-02-25 ENCOUNTER — Encounter: Payer: Self-pay | Admitting: Adult Health

## 2020-02-25 ENCOUNTER — Ambulatory Visit (INDEPENDENT_AMBULATORY_CARE_PROVIDER_SITE_OTHER): Payer: BLUE CROSS/BLUE SHIELD | Admitting: Adult Health

## 2020-02-25 ENCOUNTER — Other Ambulatory Visit: Payer: Self-pay

## 2020-02-25 VITALS — BP 125/81 | HR 97 | Temp 97.5°F | Resp 16 | Ht 59.0 in | Wt 198.4 lb

## 2020-02-25 DIAGNOSIS — F988 Other specified behavioral and emotional disorders with onset usually occurring in childhood and adolescence: Secondary | ICD-10-CM

## 2020-02-25 DIAGNOSIS — G479 Sleep disorder, unspecified: Secondary | ICD-10-CM | POA: Diagnosis not present

## 2020-02-25 DIAGNOSIS — Z79899 Other long term (current) drug therapy: Secondary | ICD-10-CM | POA: Diagnosis not present

## 2020-02-25 DIAGNOSIS — E559 Vitamin D deficiency, unspecified: Secondary | ICD-10-CM | POA: Diagnosis not present

## 2020-02-25 LAB — POCT URINE DRUG SCREEN
POC Amphetamine UR: POSITIVE — AB
POC BENZODIAZEPINES UR: POSITIVE — AB
POC Barbiturate UR: NOT DETECTED
POC Cocaine UR: NOT DETECTED
POC Ecstasy UR: NOT DETECTED
POC Marijuana UR: NOT DETECTED
POC Methadone UR: NOT DETECTED
POC Methamphetamine UR: NOT DETECTED
POC Opiate Ur: NOT DETECTED
POC Oxycodone UR: NOT DETECTED
POC PHENCYCLIDINE UR: NOT DETECTED
POC TRICYCLICS UR: NOT DETECTED

## 2020-02-25 MED ORDER — AMPHETAMINE-DEXTROAMPHETAMINE 30 MG PO TABS: 30.0000 mg | ORAL_TABLET | Freq: Every day | ORAL | 0 refills | Status: DC

## 2020-02-25 MED ORDER — VITAMIN D (ERGOCALCIFEROL) 1.25 MG (50000 UNIT) PO CAPS: 50000.0000 [IU] | ORAL_CAPSULE | ORAL | 0 refills | Status: DC

## 2020-02-25 MED ORDER — AMPHETAMINE-DEXTROAMPHETAMINE 10 MG PO TABS: 10.0000 mg | ORAL_TABLET | Freq: Every day | ORAL | 0 refills | Status: DC

## 2020-02-25 MED ORDER — ALPRAZOLAM 1 MG PO TABS: 1.0000 mg | ORAL_TABLET | Freq: Every evening | ORAL | 2 refills | Status: DC | PRN

## 2020-02-25 NOTE — Progress Notes (Signed)
[facility] [place], [place] [address]  Internal MEDICINE  Office Visit Note  Patient Name: Megan [name]  [id number]  [id number]  Date of Service: 02/25/2020  Chief Complaint  Patient presents with  . Follow-up    possible cyst behind right knee  . ADD    HPI  Pt is here for follow up on ADD, HTN and sleep disturbance.  Overall she is doing well.  She does report she started back working and has been working a lot.  She has noticed a small cyst behind her right knee.  She has had bakers cyst in the past. Blood pressure is well controlled at this time.  Denies Chest pain, Shortness of breath, palpitations, headache, or blurred vision.    Current Medication: Outpatient Encounter Medications as of 02/25/2020  Medication Sig  . ALPRAZolam (XANAX) 1 MG tablet Take 1 tablet (1 mg total) by mouth at bedtime as needed for anxiety or sleep.  Marland Kitchen amphetamine-dextroamphetamine (ADDERALL) 10 MG tablet Take 1 tablet (10 mg total) by mouth daily.  Marland Kitchen amphetamine-dextroamphetamine (ADDERALL) 30 MG tablet Take 1 tablet by mouth daily.  Marland Kitchen aspirin EC 325 MG tablet Take 325 mg by mouth as needed.  . bisoprolol (ZEBETA) 5 MG tablet Take 1 tablet (5 mg total) by mouth daily.  . ciprofloxacin (CILOXAN) 0.3 % ophthalmic solution Place 1 drop into both eyes every 2 (two) hours. Administer 1 drop, every 2 hours, while awake, for 2 days. Then 1 drop, every 4 hours, while awake, for the next 5 days.  . valACYclovir (VALTREX) 500 MG tablet Take 1 tablet (500 mg total) by mouth 2 (two) times daily.  . Vitamin D, Ergocalciferol, (DRISDOL) 1.25 MG (50000 UT) CAPS capsule Take 1 capsule (50,000 Units total) by mouth every 7 (seven) days.   No facility-administered encounter medications on file as of 02/25/2020.    Surgical History: Past Surgical History:  Procedure Laterality Date  . BREAST BIOPSY Left 2002   benign  . BUNIONECTOMY Right   . CESAREAN SECTION      Medical  History: Past Medical History:  Diagnosis Date  . ADD (attention deficit disorder)   . Pre-hypertension     Family History: Family History  Problem Relation Age of Onset  . Lung cancer Father   . Diabetes Sister   . Breast cancer Neg Hx     Social History   Socioeconomic History  . Marital status: Married    Spouse name: Not on file  . Number of children: Not on file  . Years of education: Not on file  . Highest education level: Not on file  Occupational History  . Not on file  Tobacco Use  . Smoking status: Never Smoker  . Smokeless tobacco: Never Used  Vaping Use  . Vaping Use: Never used  Substance and Sexual Activity  . Alcohol use: Yes    Comment: social  . Drug use: Yes    Types: Marijuana    Comment: once in a while   . Sexual activity: Not on file  Other Topics Concern  . Not on file  Social History Narrative  . Not on file   Social Determinants of Health   Financial Resource Strain:   . Difficulty of Paying Living Expenses:   Food Insecurity:   . Worried About Charity fundraiser in the Last Year:   . Arboriculturist in the Last Year:   Transportation Needs:   . Lack  of Transportation (Medical):   Marland Kitchen Lack of Transportation (Non-Medical):   Physical Activity:   . Days of Exercise per Week:   . Minutes of Exercise per Session:   Stress:   . Feeling of Stress :   Social Connections:   . Frequency of Communication with Friends and Family:   . Frequency of Social Gatherings with Friends and Family:   . Attends Religious Services:   . Active Member of Clubs or Organizations:   . Attends Archivist Meetings:   Marland Kitchen Marital Status:   Intimate Partner Violence:   . Fear of Current or Ex-Partner:   . Emotionally Abused:   Marland Kitchen Physically Abused:   . Sexually Abused:       Review of Systems  Constitutional: Negative for chills, fatigue and unexpected weight change.  HENT: Negative for congestion, rhinorrhea, sneezing and sore throat.    Eyes: Negative for photophobia, pain and redness.  Respiratory: Negative for cough, chest tightness and shortness of breath.   Cardiovascular: Negative for chest pain and palpitations.  Gastrointestinal: Negative for abdominal pain, constipation, diarrhea, nausea and vomiting.  Endocrine: Negative.   Genitourinary: Negative for dysuria and frequency.  Musculoskeletal: Negative for arthralgias, back pain, joint swelling and neck pain.  Skin: Negative for rash.  Allergic/Immunologic: Negative.   Neurological: Negative for tremors and numbness.  Hematological: Negative for adenopathy. Does not bruise/bleed easily.  Psychiatric/Behavioral: Negative for behavioral problems and sleep disturbance. The patient is not nervous/anxious.     Vital Signs: BP 125/81   Pulse 97   Temp (!) 97.5 F (36.4 C)   Resp 16   Ht 4\' 11"  (1.499 m)   Wt 198 lb 6.4 oz (90 kg)   SpO2 95%   BMI 40.07 kg/m    Physical Exam Vitals and nursing note reviewed.  Constitutional:      General: She is not in acute distress.    Appearance: She is well-developed. She is not diaphoretic.  HENT:     Head: Normocephalic and atraumatic.     Mouth/Throat:     Pharynx: No oropharyngeal exudate.  Eyes:     Pupils: Pupils are equal, round, and reactive to light.  Neck:     Thyroid: No thyromegaly.     Vascular: No JVD.     Trachea: No tracheal deviation.  Cardiovascular:     Rate and Rhythm: Normal rate and regular rhythm.     Heart sounds: Normal heart sounds. No murmur heard.  No friction rub. No gallop.   Pulmonary:     Effort: Pulmonary effort is normal. No respiratory distress.     Breath sounds: Normal breath sounds. No wheezing or rales.  Chest:     Chest wall: No tenderness.  Abdominal:     Palpations: Abdomen is soft.     Tenderness: There is no abdominal tenderness. There is no guarding.  Musculoskeletal:        General: Normal range of motion.     Cervical back: Normal range of motion and neck  supple.  Lymphadenopathy:     Cervical: No cervical adenopathy.  Skin:    General: Skin is warm and dry.  Neurological:     Mental Status: She is alert and oriented to person, place, and time.     Cranial Nerves: No cranial nerve deficit.  Psychiatric:        Behavior: Behavior normal.        Thought Content: Thought content normal.  Judgment: Judgment normal.    Assessment/Plan: 1. Attention deficit disorder (ADD) without hyperactivity Refilled Controlled medications today. Reviewed risks and possible side effects associated with taking Stimulants. Combination of these drugs with other psychotropic medications could cause dizziness and drowsiness. Pt needs to Monitor symptoms and exercise caution in driving and operating heavy machinery to avoid damages to oneself, to others and to the surroundings. Patient verbalized understanding in this matter. Dependence and abuse for these drugs will be monitored closely. A Controlled substance policy and procedure is on file which allows [facility] to order a urine drug screen test at any visit. Patient understands and agrees with the plan.. - amphetamine-dextroamphetamine (ADDERALL) 30 MG tablet; Take 1 tablet by mouth daily.  Dispense: 30 tablet; Refill: 0 - amphetamine-dextroamphetamine (ADDERALL) 10 MG tablet; Take 1 tablet (10 mg total) by mouth daily.  Dispense: 30 tablet; Refill: 0 - amphetamine-dextroamphetamine (ADDERALL) 10 MG tablet; Take 1 tablet (10 mg total) by mouth daily.  Dispense: 30 tablet; Refill: 0  2. Vitamin D deficiency Continue to take Vit D as prescribed  - Vitamin D, Ergocalciferol, (DRISDOL) 1.25 MG (50000 UNIT) CAPS capsule; Take 1 capsule (50,000 Units total) by mouth every 7 (seven) days.  Dispense: 10 capsule; Refill: 0  3. Sleep disturbance Reviewed risks and possible side effects associated with taking opiates, benzodiazepines and other CNS depressants. Combination of these could cause dizziness  and drowsiness. Advised patient not to drive or operate machinery when taking these medications, as patient's and other's life can be at risk and will have consequences. Patient verbalized understanding in this matter. Dependence and abuse for these drugs will be monitored closely. A Controlled substance policy and procedure is on file which allows [facility] to order a urine drug screen test at any visit. Patient understands and agrees with the plan - ALPRAZolam (XANAX) 1 MG tablet; Take 1 tablet (1 mg total) by mouth at bedtime as needed for anxiety or sleep.  Dispense: 30 tablet; Refill: 2  4. Encounter for long-term (current) use of medications - POCT Urine Drug Screen  General Counseling: Helaine verbalizes understanding of the findings of todays visit and agrees with plan of treatment. I have discussed any further diagnostic evaluation that may be needed or ordered today. We also reviewed her medications today. she has been encouraged to call the office with any questions or concerns that should arise related to todays visit.    Orders Placed This Encounter  Procedures  . POCT Urine Drug Screen    No orders of the defined types were placed in this encounter.   Time spent: 30 Minutes   This patient was seen by Orson Gear AGNP-C in Collaboration with Dr Lavera Guise as a part of collaborative care agreement     Kendell Bane AGNP-C Internal medicine

## 2020-02-27 ENCOUNTER — Other Ambulatory Visit: Payer: Self-pay | Admitting: Adult Health

## 2020-02-27 DIAGNOSIS — I10 Essential (primary) hypertension: Secondary | ICD-10-CM

## 2020-05-01 ENCOUNTER — Other Ambulatory Visit: Payer: Self-pay | Admitting: Adult Health

## 2020-05-01 DIAGNOSIS — E559 Vitamin D deficiency, unspecified: Secondary | ICD-10-CM

## 2020-05-01 NOTE — Telephone Encounter (Signed)
She will need labs to be able to continue on this, so I will refuse for now

## 2020-05-20 ENCOUNTER — Encounter: Payer: Self-pay | Admitting: Hospice and Palliative Medicine

## 2020-05-20 ENCOUNTER — Ambulatory Visit (INDEPENDENT_AMBULATORY_CARE_PROVIDER_SITE_OTHER): Payer: BLUE CROSS/BLUE SHIELD | Admitting: Hospice and Palliative Medicine

## 2020-05-20 ENCOUNTER — Other Ambulatory Visit: Payer: Self-pay

## 2020-05-20 DIAGNOSIS — I10 Essential (primary) hypertension: Secondary | ICD-10-CM | POA: Diagnosis not present

## 2020-05-20 DIAGNOSIS — G479 Sleep disorder, unspecified: Secondary | ICD-10-CM

## 2020-05-20 DIAGNOSIS — B009 Herpesviral infection, unspecified: Secondary | ICD-10-CM

## 2020-05-20 DIAGNOSIS — Z79899 Other long term (current) drug therapy: Secondary | ICD-10-CM

## 2020-05-20 DIAGNOSIS — F988 Other specified behavioral and emotional disorders with onset usually occurring in childhood and adolescence: Secondary | ICD-10-CM

## 2020-05-20 LAB — POCT URINE DRUG SCREEN
Methylenedioxyamphetamine: NOT DETECTED
POC Amphetamine UR: POSITIVE — AB
POC BENZODIAZEPINES UR: POSITIVE — AB
POC Barbiturate UR: NOT DETECTED
POC Cocaine UR: NOT DETECTED
POC Ecstasy UR: NOT DETECTED
POC Marijuana UR: NOT DETECTED
POC Methadone UR: NOT DETECTED
POC Methamphetamine UR: NOT DETECTED
POC Opiate Ur: NOT DETECTED
POC Oxycodone UR: NOT DETECTED
POC PHENCYCLIDINE UR: NOT DETECTED
POC TRICYCLICS UR: NOT DETECTED

## 2020-05-20 MED ORDER — VALACYCLOVIR HCL 500 MG PO TABS: 500.0000 mg | ORAL_TABLET | Freq: Two times a day (BID) | ORAL | 2 refills | Status: DC

## 2020-05-20 MED ORDER — TRAZODONE HCL 50 MG PO TABS: 25.0000 mg | ORAL_TABLET | Freq: Every evening | ORAL | 3 refills | Status: DC | PRN

## 2020-05-20 MED ORDER — BISOPROLOL FUMARATE 5 MG PO TABS: 5.0000 mg | ORAL_TABLET | Freq: Every day | ORAL | 1 refills | Status: DC

## 2020-05-20 NOTE — Progress Notes (Signed)
[facility] [place], [place] [address]  Internal MEDICINE  Office Visit Note  Patient Name: Megan [name]  [id number]  [id number]  Date of Service: 05/21/2020  Chief Complaint  Patient presents with  . Follow-up    refill request, struggling to sleep, gets maybe 5 hours of sleep a night, pt can fall asleep fine but will wake up and then not be able to get back to sleep  . Quality Metric Gaps    HepC, HIV screening     HPI Patient is here for routine follow-up Complains today of having difficulty with her sleeping. She says she has no trouble falling asleep but on most nights she wakes up around 3-4am, averaging about 4-5 hours of sleep per night. When she wakes up in the middle of the night she feels as though her thoughts are scattered and cannot seem to calm her mind. She says her prescribed alprazolam does help with her sleep but she does not want to rely on this medication every night to help with her sleep. She does not want to become addicted to this medication. She is requesting refills of her adderall prescription today. She also mentions she would like to slowly begin tapering herself off of this medication. She was initially prescribed adderall over 20 years ago. She takes her Adderall on most days, will not take it at times on the weekend. BP-discussed that her BP was elevated today. She says she has had a stressful morning and that when she checks her BP at home it is normal.   Current Medication: Outpatient Encounter Medications as of 05/20/2020  Medication Sig  . ALPRAZolam (XANAX) 1 MG tablet Take 1 tablet (1 mg total) by mouth at bedtime as needed for anxiety or sleep.  Marland Kitchen amphetamine-dextroamphetamine (ADDERALL) 10 MG tablet Take 1 tablet (10 mg total) by mouth daily.  Marland Kitchen amphetamine-dextroamphetamine (ADDERALL) 10 MG tablet Take 1 tablet (10 mg total) by mouth daily.  Marland Kitchen amphetamine-dextroamphetamine (ADDERALL) 10 MG tablet Take 1 tablet (10  mg total) by mouth daily.  Marland Kitchen amphetamine-dextroamphetamine (ADDERALL) 30 MG tablet Take 1 tablet by mouth daily.  Marland Kitchen amphetamine-dextroamphetamine (ADDERALL) 30 MG tablet Take 1 tablet by mouth daily.  Marland Kitchen amphetamine-dextroamphetamine (ADDERALL) 30 MG tablet Take 1 tablet by mouth daily.  Marland Kitchen aspirin EC 325 MG tablet Take 325 mg by mouth as needed.  . bisoprolol (ZEBETA) 5 MG tablet Take 1 tablet (5 mg total) by mouth daily.  . valACYclovir (VALTREX) 500 MG tablet Take 1 tablet (500 mg total) by mouth 2 (two) times daily.  . Vitamin D, Ergocalciferol, (DRISDOL) 1.25 MG (50000 UNIT) CAPS capsule Take 1 capsule (50,000 Units total) by mouth every 7 (seven) days.  . [DISCONTINUED] bisoprolol (ZEBETA) 5 MG tablet TAKE 1 TABLET BY MOUTH EVERY DAY  . [DISCONTINUED] valACYclovir (VALTREX) 500 MG tablet Take 1 tablet (500 mg total) by mouth 2 (two) times daily.  . ciprofloxacin (CILOXAN) 0.3 % ophthalmic solution Place 1 drop into both eyes every 2 (two) hours. Administer 1 drop, every 2 hours, while awake, for 2 days. Then 1 drop, every 4 hours, while awake, for the next 5 days. (Patient not taking: Reported on 05/20/2020)  . traZODone (DESYREL) 50 MG tablet Take 0.5-1 tablets (25-50 mg total) by mouth at bedtime as needed for sleep.   No facility-administered encounter medications on file as of 05/20/2020.    Surgical History: Past Surgical History:  Procedure Laterality Date  . BREAST BIOPSY Left 2002  benign  . BUNIONECTOMY Right   . CESAREAN SECTION      Medical History: Past Medical History:  Diagnosis Date  . ADD (attention deficit disorder)   . Pre-hypertension     Family History: Family History  Problem Relation Age of Onset  . Lung cancer Father   . Diabetes Sister   . Breast cancer Neg Hx     Social History   Socioeconomic History  . Marital status: Married    Spouse name: Not on file  . Number of children: Not on file  . Years of education: Not on file  . Highest education  level: Not on file  Occupational History  . Not on file  Tobacco Use  . Smoking status: Never Smoker  . Smokeless tobacco: Never Used  Vaping Use  . Vaping Use: Never used  Substance and Sexual Activity  . Alcohol use: Yes    Comment: social  . Drug use: Yes    Types: Marijuana    Comment: once in a while   . Sexual activity: Not on file  Other Topics Concern  . Not on file  Social History Narrative  . Not on file   Social Determinants of Health   Financial Resource Strain:   . Difficulty of Paying Living Expenses: Not on file  Food Insecurity:   . Worried About Charity fundraiser in the Last Year: Not on file  . Ran Out of Food in the Last Year: Not on file  Transportation Needs:   . Lack of Transportation (Medical): Not on file  . Lack of Transportation (Non-Medical): Not on file  Physical Activity:   . Days of Exercise per Week: Not on file  . Minutes of Exercise per Session: Not on file  Stress:   . Feeling of Stress : Not on file  Social Connections:   . Frequency of Communication with Friends and Family: Not on file  . Frequency of Social Gatherings with Friends and Family: Not on file  . Attends Religious Services: Not on file  . Active Member of Clubs or Organizations: Not on file  . Attends Archivist Meetings: Not on file  . Marital Status: Not on file  Intimate Partner Violence:   . Fear of Current or Ex-Partner: Not on file  . Emotionally Abused: Not on file  . Physically Abused: Not on file  . Sexually Abused: Not on file    Review of Systems  Constitutional: Negative for chills, diaphoresis and fatigue.  HENT: Negative for ear pain, postnasal drip and sinus pressure.   Eyes: Negative for photophobia, discharge, redness, itching and visual disturbance.  Respiratory: Negative for cough, shortness of breath and wheezing.   Cardiovascular: Negative for chest pain, palpitations and leg swelling.  Gastrointestinal: Negative for abdominal pain,  constipation, diarrhea, nausea and vomiting.  Genitourinary: Negative for dysuria and flank pain.  Musculoskeletal: Negative for arthralgias, back pain, gait problem and neck pain.  Skin: Negative for color change.  Allergic/Immunologic: Negative for environmental allergies and food allergies.  Neurological: Negative for dizziness, tremors, weakness and headaches.  Hematological: Does not bruise/bleed easily.  Psychiatric/Behavioral: Positive for sleep disturbance. Negative for agitation, behavioral problems (depression) and hallucinations.    Vital Signs: BP (!) 152/89   Pulse 80   Temp 97.6 F (36.4 C)   Resp 16   Ht 4\' 11"  (1.499 m)   Wt 198 lb (89.8 kg)   SpO2 95%   BMI 39.99 kg/m    Physical  Exam Vitals reviewed.  Constitutional:      Appearance: Normal appearance. She is obese.  HENT:     Mouth/Throat:     Mouth: Mucous membranes are moist.     Pharynx: Oropharynx is clear.  Cardiovascular:     Rate and Rhythm: Normal rate and regular rhythm.     Pulses: Normal pulses.     Heart sounds: Normal heart sounds.  Pulmonary:     Effort: Pulmonary effort is normal.     Breath sounds: Normal breath sounds.  Abdominal:     General: Abdomen is flat.     Palpations: Abdomen is soft.  Musculoskeletal:        General: Normal range of motion.     Cervical back: Normal range of motion.  Skin:    General: Skin is warm.  Neurological:     General: No focal deficit present.     Mental Status: She is alert and oriented to person, place, and time. Mental status is at baseline.  Psychiatric:        Mood and Affect: Mood normal.        Behavior: Behavior normal.        Thought Content: Thought content normal.    Assessment/Plan: 1. Essential hypertension BP elevated today, not improved on manual recheck. She reports adherence to medication and feels as though her BP is elevated due to the stressful morning she has had. She mentions that her BP is well controlled when she  monitors it home. She did not bring a log of BP readings today. Encouraged her to continue with monitoring BP at home and to bring log to next follow-up visit. May need to consider echo to assess LV function. - bisoprolol (ZEBETA) 5 MG tablet; Take 1 tablet (5 mg total) by mouth daily.  Dispense: 90 tablet; Refill: 1  2. Sleep disturbance In depth discussion that adderall being a stimulant could be contributing to her sleep disturbances. Will review PSG to rule out OSA being the cause of sleep disturbance and daytime sleepiness. In the interim, trazodone to help with her sleep. Advised to use alprazolam as needed and not on a nightly basis. - PSG Sleep Study; Future - traZODone (DESYREL) 50 MG tablet; Take 0.5-1 tablets (25-50 mg total) by mouth at bedtime as needed for sleep.  Dispense: 30 tablet; Refill: 3  3. Attention deficit disorder (ADD) without hyperactivity Unable to refill medication at this time due to elevated BP. Will need to have documented controlled BP. Discussed that it would be best for [place] to try and wean her down or off of Adderall slowly.  4. HSV (herpes simplex virus) infection Stable at this time, requesting refills. - valACYclovir (VALTREX) 500 MG tablet; Take 1 tablet (500 mg total) by mouth 2 (two) times daily.  Dispense: 30 tablet; Refill: 2  5. Encounter for long-term (current) use of medications - POCT Urine Drug Screen  General Counseling: Shakea verbalizes understanding of the findings of todays visit and agrees with plan of treatment. I have discussed any further diagnostic evaluation that may be needed or ordered today. We also reviewed her medications today. she has been encouraged to call the office with any questions or concerns that should arise related to todays visit.    Orders Placed This Encounter  Procedures  . POCT Urine Drug Screen  . PSG Sleep Study    Meds ordered this encounter  Medications  . traZODone (DESYREL) 50 MG tablet    Sig: Take  0.5-1  tablets (25-50 mg total) by mouth at bedtime as needed for sleep.    Dispense:  30 tablet    Refill:  3  . valACYclovir (VALTREX) 500 MG tablet    Sig: Take 1 tablet (500 mg total) by mouth 2 (two) times daily.    Dispense:  30 tablet    Refill:  2  . bisoprolol (ZEBETA) 5 MG tablet    Sig: Take 1 tablet (5 mg total) by mouth daily.    Dispense:  90 tablet    Refill:  1    Time spent: 30 Minutes Time spent includes review of chart, medications, test results and follow-up plan with the patient.  This patient was seen by Theodoro Grist AGNP-C in Collaboration with Dr Lavera Guise as a part of collaborative care agreement     Tanna Furry. Patra Gherardi AGNP-C Internal medicine

## 2020-05-21 ENCOUNTER — Encounter: Payer: Self-pay | Admitting: Hospice and Palliative Medicine

## 2020-05-22 ENCOUNTER — Telehealth: Payer: Self-pay

## 2020-05-25 ENCOUNTER — Other Ambulatory Visit: Payer: Self-pay | Admitting: Hospice and Palliative Medicine

## 2020-05-25 MED ORDER — AMPHETAMINE-DEXTROAMPHETAMINE 10 MG PO TABS: 10.0000 mg | ORAL_TABLET | Freq: Every day | ORAL | 0 refills | Status: DC

## 2020-05-25 MED ORDER — AMPHETAMINE-DEXTROAMPHETAMINE 30 MG PO TABS: 30.0000 mg | ORAL_TABLET | Freq: Every day | ORAL | 0 refills | Status: DC

## 2020-05-25 NOTE — Progress Notes (Signed)
Patient called with improved BP reading from home. Will send 30 day supply of Adderall to pharmacy and have her follow-up at that time to assess BP.

## 2020-05-26 ENCOUNTER — Ambulatory Visit: Payer: BLUE CROSS/BLUE SHIELD | Admitting: Hospice and Palliative Medicine

## 2020-05-29 ENCOUNTER — Telehealth: Payer: Self-pay

## 2020-05-29 NOTE — Telephone Encounter (Signed)
LMOM for OV on 9/21 

## 2020-05-31 ENCOUNTER — Other Ambulatory Visit: Payer: Self-pay | Admitting: Hospice and Palliative Medicine

## 2020-05-31 DIAGNOSIS — B009 Herpesviral infection, unspecified: Secondary | ICD-10-CM

## 2020-06-02 ENCOUNTER — Ambulatory Visit: Payer: BLUE CROSS/BLUE SHIELD | Admitting: Hospice and Palliative Medicine

## 2020-06-09 ENCOUNTER — Telehealth: Payer: Self-pay

## 2020-06-15 ENCOUNTER — Other Ambulatory Visit: Payer: Self-pay | Admitting: Hospice and Palliative Medicine

## 2020-06-15 DIAGNOSIS — G479 Sleep disorder, unspecified: Secondary | ICD-10-CM

## 2020-06-15 DIAGNOSIS — B009 Herpesviral infection, unspecified: Secondary | ICD-10-CM

## 2020-07-14 ENCOUNTER — Encounter: Payer: Self-pay | Admitting: Internal Medicine

## 2020-07-14 ENCOUNTER — Telehealth: Payer: Self-pay

## 2020-07-14 ENCOUNTER — Other Ambulatory Visit: Payer: Self-pay

## 2020-07-14 ENCOUNTER — Ambulatory Visit: Payer: BLUE CROSS/BLUE SHIELD | Admitting: Internal Medicine

## 2020-07-14 VITALS — BP 140/86 | HR 82 | Temp 98.2°F | Resp 16 | Ht <= 58 in | Wt 190.6 lb

## 2020-07-14 DIAGNOSIS — I10 Essential (primary) hypertension: Secondary | ICD-10-CM

## 2020-07-14 DIAGNOSIS — Z1231 Encounter for screening mammogram for malignant neoplasm of breast: Secondary | ICD-10-CM | POA: Diagnosis not present

## 2020-07-14 DIAGNOSIS — R079 Chest pain, unspecified: Secondary | ICD-10-CM

## 2020-07-14 DIAGNOSIS — G479 Sleep disorder, unspecified: Secondary | ICD-10-CM

## 2020-07-14 DIAGNOSIS — F411 Generalized anxiety disorder: Secondary | ICD-10-CM

## 2020-07-14 DIAGNOSIS — F988 Other specified behavioral and emotional disorders with onset usually occurring in childhood and adolescence: Secondary | ICD-10-CM

## 2020-07-14 DIAGNOSIS — R9431 Abnormal electrocardiogram [ECG] [EKG]: Secondary | ICD-10-CM

## 2020-07-14 DIAGNOSIS — Z23 Encounter for immunization: Secondary | ICD-10-CM

## 2020-07-14 MED ORDER — BUPROPION HCL ER (XL) 150 MG PO TB24: 150.0000 mg | ORAL_TABLET | Freq: Every day | ORAL | 3 refills | Status: DC

## 2020-07-14 MED ORDER — ALPRAZOLAM 0.5 MG PO TABS: 0.5000 mg | ORAL_TABLET | Freq: Every evening | ORAL | 1 refills | Status: DC | PRN

## 2020-07-14 MED ORDER — AMPHETAMINE-DEXTROAMPHETAMINE 20 MG PO TABS: ORAL_TABLET | ORAL | 0 refills | Status: DC

## 2020-07-14 NOTE — Progress Notes (Signed)
[facility] [place], [place] [address]  Internal MEDICINE  Office Visit Note  Patient Name: Megan [name]  [id number]  [id number]  Date of Service: 07/14/2020  Chief Complaint  Patient presents with  . Follow-up    adderall not correctly sent in, new sleeping meds not working, pt wants to go back to the previous med, wants ears checked  . ADD  . policy update form    reviewed    HPI Pt is here for routine follow up. She continues to request refills of Adderall and xanax. Pt has problem sleeping at night. Has been instructed to taper down, her blood pressure is slightly better but not under optimum control. She does not want to be on trazodone. Pt has been tachycardiac in the past as well  Pt does have anxiety and feels overwhelmed. Occasional chest pain/pressure. Pt also snores at night  Current Medication: Outpatient Encounter Medications as of 07/14/2020  Medication Sig  . ALPRAZolam (XANAX) 0.5 MG tablet Take 1 tablet (0.5 mg total) by mouth at bedtime as needed for anxiety.  Marland Kitchen amphetamine-dextroamphetamine (ADDERALL) 20 MG tablet Take one and half tab po qd  . aspirin EC 325 MG tablet Take 325 mg by mouth as needed.  . bisoprolol (ZEBETA) 5 MG tablet Take 1 tablet (5 mg total) by mouth daily.  Marland Kitchen buPROPion (WELLBUTRIN XL) 150 MG 24 hr tablet Take 1 tablet (150 mg total) by mouth daily.  . valACYclovir (VALTREX) 500 MG tablet TAKE 1 TABLET BY MOUTH TWICE A DAY  . Vitamin D, Ergocalciferol, (DRISDOL) 1.25 MG (50000 UNIT) CAPS capsule Take 1 capsule (50,000 Units total) by mouth every 7 (seven) days.  . [DISCONTINUED] ALPRAZolam (XANAX) 1 MG tablet Take 1 tablet (1 mg total) by mouth at bedtime as needed for anxiety or sleep.  . [DISCONTINUED] amphetamine-dextroamphetamine (ADDERALL) 10 MG tablet Take 1 tablet (10 mg total) by mouth daily.  . [DISCONTINUED] amphetamine-dextroamphetamine (ADDERALL) 10 MG tablet Take 1 tablet (10 mg total) by mouth daily  with breakfast.  . [DISCONTINUED] amphetamine-dextroamphetamine (ADDERALL) 30 MG tablet Take 1 tablet by mouth daily.  . [DISCONTINUED] ciprofloxacin (CILOXAN) 0.3 % ophthalmic solution Place 1 drop into both eyes every 2 (two) hours. Administer 1 drop, every 2 hours, while awake, for 2 days. Then 1 drop, every 4 hours, while awake, for the next 5 days. (Patient not taking: Reported on 05/20/2020)  . [DISCONTINUED] traZODone (DESYREL) 50 MG tablet TAKE 0.5-1 TABLETS (25-50 MG TOTAL) BY MOUTH AT BEDTIME AS NEEDED FOR SLEEP.   No facility-administered encounter medications on file as of 07/14/2020.    Surgical History: Past Surgical History:  Procedure Laterality Date  . BREAST BIOPSY Left 2002   benign  . BUNIONECTOMY Right   . CESAREAN SECTION      Medical History: Past Medical History:  Diagnosis Date  . ADD (attention deficit disorder)   . Pre-hypertension     Family History: Family History  Problem Relation Age of Onset  . Lung cancer Father   . Diabetes Sister   . Breast cancer Neg Hx     Social History   Socioeconomic History  . Marital status: Married    Spouse name: Not on file  . Number of children: Not on file  . Years of education: Not on file  . Highest education level: Not on file  Occupational History  . Not on file  Tobacco Use  . Smoking status: Never Smoker  . Smokeless tobacco: Never  Used  Vaping Use  . Vaping Use: Never used  Substance and Sexual Activity  . Alcohol use: Yes    Comment: social  . Drug use: Not Currently    Types: Marijuana    Comment: once in a while   . Sexual activity: Not on file  Other Topics Concern  . Not on file  Social History Narrative  . Not on file   Social Determinants of Health   Financial Resource Strain:   . Difficulty of Paying Living Expenses: Not on file  Food Insecurity:   . Worried About Charity fundraiser in the Last Year: Not on file  . Ran Out of Food in the Last Year: Not on file  Transportation  Needs:   . Lack of Transportation (Medical): Not on file  . Lack of Transportation (Non-Medical): Not on file  Physical Activity:   . Days of Exercise per Week: Not on file  . Minutes of Exercise per Session: Not on file  Stress:   . Feeling of Stress : Not on file  Social Connections:   . Frequency of Communication with Friends and Family: Not on file  . Frequency of Social Gatherings with Friends and Family: Not on file  . Attends Religious Services: Not on file  . Active Member of Clubs or Organizations: Not on file  . Attends Archivist Meetings: Not on file  . Marital Status: Not on file  Intimate Partner Violence:   . Fear of Current or Ex-Partner: Not on file  . Emotionally Abused: Not on file  . Physically Abused: Not on file  . Sexually Abused: Not on file      Review of Systems  Constitutional: Negative for chills, diaphoresis and fatigue.  HENT: Negative for ear pain, postnasal drip and sinus pressure.   Eyes: Negative for photophobia, discharge, redness, itching and visual disturbance.  Respiratory: Negative for cough, shortness of breath and wheezing.   Cardiovascular: Negative for chest pain, palpitations and leg swelling.  Gastrointestinal: Negative for abdominal pain, constipation, diarrhea, nausea and vomiting.  Genitourinary: Negative for dysuria and flank pain.  Musculoskeletal: Negative for arthralgias, back pain, gait problem and neck pain.  Skin: Negative for color change.  Allergic/Immunologic: Negative for environmental allergies and food allergies.  Neurological: Negative for dizziness and headaches.  Hematological: Does not bruise/bleed easily.  Psychiatric/Behavioral: Negative for agitation, behavioral problems (depression) and hallucinations.    Vital Signs: BP 140/86   Pulse 82   Temp 98.2 F (36.8 C)   Resp 16   Ht 4\' 10"  (1.473 m)   Wt 190 lb 9.6 oz (86.5 kg)   SpO2 98%   BMI 39.84 kg/m    Physical Exam Constitutional:       General: She is not in acute distress.    Appearance: She is well-developed. She is not diaphoretic.  HENT:     Head: Normocephalic and atraumatic.     Mouth/Throat:     Pharynx: No oropharyngeal exudate.  Eyes:     Pupils: Pupils are equal, round, and reactive to light.  Neck:     Thyroid: No thyromegaly.     Vascular: No JVD.     Trachea: No tracheal deviation.  Cardiovascular:     Rate and Rhythm: Normal rate.     Heart sounds: Normal heart sounds. No murmur heard.  No friction rub. No gallop.      Comments: pvcs Pulmonary:     Effort: Pulmonary effort is normal. No respiratory  distress.     Breath sounds: No wheezing or rales.  Chest:     Chest wall: No tenderness.  Abdominal:     General: Bowel sounds are normal.     Palpations: Abdomen is soft.  Musculoskeletal:        General: Normal range of motion.     Cervical back: Normal range of motion and neck supple.  Lymphadenopathy:     Cervical: No cervical adenopathy.  Skin:    General: Skin is warm and dry.  Neurological:     Mental Status: She is alert and oriented to person, place, and time.     Cranial Nerves: No cranial nerve deficit.  Psychiatric:        Behavior: Behavior normal.        Thought Content: Thought content normal.        Judgment: Judgment normal.    Assessment/Plan: 1. Encounter for mammogram to establish baseline mammogram Schedule mammogram today for CPE - MM DIGITAL SCREENING BILATERAL; Future  2. Sleep disturbance Discussed dependence on amphetamines.Patient has sign and symptoms of OSA ( disturbed sleep, excessive fatigue during the day, uncontrolled bp and abnormal BMI). Baseline sleep study is ordered to further look into this. Long term complications of OSA was addressed with the patient. - ALPRAZolam (XANAX) 0.5 MG tablet; Take 1 tablet (0.5 mg total) by mouth at bedtime as needed for anxiety.  Dispense: 30 tablet; Refill: 1. Short term rx is given  - Home sleep test  3. Flu vaccine  need - Flu Vaccine MDCK QUAD PF  4. Chest pain, unspecified type Pt has vague symptoms of chest pressure, pvcs on exam, EKG shows atrial enlargement  - ECHOCARDIOGRAM COMPLETE; Future - EKG 12-Lead  5. Abnormal ECG - ECHOCARDIOGRAM COMPLETE; Future  6. Attention deficit disorder (ADD) without hyperactivity Pt is willing to taper adderall for now - amphetamine-dextroamphetamine (ADDERALL) 20 MG tablet; Take one and half tab po qd  Dispense: 45 tablet; Refill: 0  7. GAD (generalized anxiety disorder) Start Wellbutrin 150 mg po qd, will titrate as needed   8. Benign hypertension Continue bisoprolol, will need to increase if bp is elevated on next visit   General Counseling: Shellye verbalizes understanding of the findings of todays visit and agrees with plan of treatment. I have discussed any further diagnostic evaluation that may be needed or ordered today. We also reviewed her medications today. she has been encouraged to call the office with any questions or concerns that should arise related to todays visit.  Orders Placed This Encounter  Procedures  . MM DIGITAL SCREENING BILATERAL  . Flu Vaccine MDCK QUAD PF  . EKG 12-Lead  . ECHOCARDIOGRAM COMPLETE  . Home sleep test    Meds ordered this encounter  Medications  . ALPRAZolam (XANAX) 0.5 MG tablet    Sig: Take 1 tablet (0.5 mg total) by mouth at bedtime as needed for anxiety.    Dispense:  30 tablet    Refill:  1  . amphetamine-dextroamphetamine (ADDERALL) 20 MG tablet    Sig: Take one and half tab po qd    Dispense:  45 tablet    Refill:  0  . buPROPion (WELLBUTRIN XL) 150 MG 24 hr tablet    Sig: Take 1 tablet (150 mg total) by mouth daily.    Dispense:  30 tablet    Refill:  3    Total time spent:35 Minutes Time spent includes review of chart, medications, test results, and follow up plan with  the patient.   Dr Lavera Guise Internal medicine

## 2020-07-14 NOTE — Telephone Encounter (Signed)
Patient scheduled with [facility] sleep study 08/03/2020. Megan [name]

## 2020-07-15 ENCOUNTER — Encounter: Payer: Self-pay | Admitting: Internal Medicine

## 2020-07-15 ENCOUNTER — Other Ambulatory Visit: Payer: Self-pay | Admitting: Internal Medicine

## 2020-07-15 DIAGNOSIS — Z1231 Encounter for screening mammogram for malignant neoplasm of breast: Secondary | ICD-10-CM

## 2020-08-03 ENCOUNTER — Encounter (INDEPENDENT_AMBULATORY_CARE_PROVIDER_SITE_OTHER): Payer: BLUE CROSS/BLUE SHIELD | Admitting: Internal Medicine

## 2020-08-03 DIAGNOSIS — G4719 Other hypersomnia: Secondary | ICD-10-CM | POA: Diagnosis not present

## 2020-08-05 ENCOUNTER — Ambulatory Visit: Payer: BLUE CROSS/BLUE SHIELD

## 2020-08-05 DIAGNOSIS — R079 Chest pain, unspecified: Secondary | ICD-10-CM

## 2020-08-05 DIAGNOSIS — R9431 Abnormal electrocardiogram [ECG] [EKG]: Secondary | ICD-10-CM

## 2020-08-08 NOTE — Procedures (Signed)
[place] Report Part I                                                               Phone: [phone] Fax: [phone]  Patient Name: Megan [name], Megan [name] Acquisition Number: [id number]  Date of Birth: [birth year] Acquisition Date: 08/03/2020  Referring Physician: Devona Konig, MD     History: The patient is a 59 year old female who was referred for evaluation of possible sleep apnea. Medical History: ADD, pre-hypertension.  Medications: Xanax, Adderall, aspirin, Zebeta, Valtrex, Vitamin D, Desyrel, azelastine.  Procedure: This routine overnight polysomnogram was performed on the [facility] 5 using the standard diagnostic protocol. This included 6 channels of EEG, 2 channels of EOG, chin EMG, bilateral anterior tibialis EMG, nasal/oral thermistor, PTAF (nasal pressure transducer), chest and abdominal wall movements, EKG, and pulse oximetry.  Description: The total recording time was 462.9 minutes. The total sleep time was 234.0 minutes. There were a total of 213.1 minutes of wakefulness after sleep onset for a poor??sleep efficiency of 50.6%. The latency to sleep onset was within normal limits?at 15.8 minutes. The R sleep onset latency was N/A. ????? Sleep parameters, as a percentage of the total sleep time, demonstrated 0.4% of sleep was in N1 sleep, 72.0% N2, 27.6% N3 and 0.0% R sleep. There were a total of 28 arousals for an arousal index of 7.2 arousals per hour of sleep that was normal.???  Respiratory monitoring demonstrated frequent mild degree of snoring in all positions. Only 3 respiratory events were noted the entire study. The baseline oxygen saturation during wakefulness was 93%, during NREM sleep averaged 91%, and during REM sleep averaged N/A. The total duration of oxygen < 90% was 70.7 minutes and <80% was 0.3 minutes.  Cardiac monitoring- did not demonstrate transient cardiac decelerations associated with the apneas. There were no significant cardiac  rhythm irregularities.   Periodic limb movement monitoring- demonstrated that there were 263 periodic limb movements for a periodic limb movement index of 67.4 periodic limb movements per hour of sleep. Quasi-periodic limb movements were observed during periods of wakefulness.  Impression: ?This routine overnight polysomnogram did not demonstrate significant obstructive sleep apnea with only 3 respiratory events observed.    There was a highly elevated periodic limb movement index of 67.4 periodic limb movements per hour of sleep. These limb movements did not appear to provoke arousals, however quasi-periodic limb movements were observed during periods of wakefulness and may have contributed to the long period of wakefulness during the night.   There was a poor sleep efficiency due to a prolonged period of wakefulness during the night.  Increased awakenings?and no REM sleep were observed. Some of the poor sleep may be due to the limb movements particularly in wakefulness. In addition, sleep hygiene recommendations should be reviewed. The patient may benefit from cognitive behavioral therapy for insomnia. This can be done with a mental health professional or via web-based programs.  Recommendations:     1. This sleep study did not show presence of significant sleep disordered breathing with an overall AHI of 0.8 per hour. 2. Transient oxygen desaturations were noted with a minimal desaturation of 85% 3. Would recommend weight loss in a patient with a BMI of 40.8. 4. Evaluation for possible  restless leg syndrome is suggested.     Allyne Gee, MD, [facility] Diplomate ABMS Pulmonary, Critical Care Medicine Sleep Medicine  Electronically reviewed and digitally signed     [place] Report Part II  Phone: [phone] Fax: [phone]  Patient last name [name] Neck Size 14.0 in. Acquisition [id number]  Patient first name Megan Weight 195.0 lbs. Started  08/03/2020 at 11:19:11 PM  Birth date [birth year] Height 58.0 in. Stopped 08/04/2020 at 70:17:47 AM  Age 34 BMI 40.8 lb/in2 Duration 462.9  Study Type Adult      Report generated by: Herschel Senegal. Wadie Lessen, RPSGT Sleep Data: Lights Out: 11:33:53 PM Sleep Onset: 11:49:41 PM  Lights On: 7:16:47 AM Sleep Efficiency: 50.6 %  Total Recording Time: 462.9 min Sleep Latency (from Lights Off) 15.8 min  Total Sleep Time (TST): 234.0 min R Latency (from Sleep Onset): N/A  Sleep Period Time: 442.5 min Total number of awakenings: 26  Wake during sleep: 208.5 min Wake After Sleep Onset (WASO): 213.1 min   Sleep Data:         Arousal Summary: Stage  Latency from lights out (min) Latency from sleep onset (min) Duration (min) % Total Sleep Time  Normal values  N 1 205.3 189.5 1.0 0.4 (5%)  N 2 15.8 0.0 168.5 72.0 (50%)  N 3 47.3 31.5 64.5 27.6 (20%)  R N/A N/A 0.0 0.0 (25%)    Number Index  Spontaneous 29 7.4  Apneas & Hypopneas 0 0.0  RERAs 0 0.0       (Apneas & Hypopneas & RERAs)  (0) (0.0)  Limb Movement 10 2.6  Snore 0 0.0  TOTAL 39 10.0      Respiratory Data:  CA OA MA Apnea Hypopnea* A+ H RERA Total  Number 0 0 0 0 3 3 0 3  Mean Dur (sec) 0.0 0.0 0.0 0.0 28.3 28.3 0.0 28.3  Max Dur (sec) 0.0 0.0 0.0 0.0 41.5 41.5 0.0 41.5  Total Dur (min) 0.0 0.0 0.0 0.0 1.4 1.4 0.0 1.4  % of TST 0.0 0.0 0.0 0.0 0.6 0.6 0.0 0.6  Index (#/h TST) 0.0 0.0 0.0 0.0 0.8 0.8 0.0 0.8  *Hypopneas scored based on 4% or greater desaturation.  Sleep Stage:        REM NREM TST  AHI N/A 0.8 0.8  RDI N/A 0.8 0.8            Body Position Data:  Sleep (min) TST (%) REM (min) NREM (min) CA (#) OA (#) MA (#) HYP (#) AHI (#/h) RERA (#) RDI (#/h) Desat (#)  Supine 69.3 29.62 0.0 69.3 0 0 0 3 2.6 0 2.6 9  Non-Supine 164.70 70.38 0.00 164.70 0.00 0.00 0.00 0.00 0.00 0 0.00 11.00  Left: 76.2 32.56 0.0 76.2 0 0 0 0 0.0 0 0.00 6  Prone: 88.5 37.82 0.0 88.5 0 0 0 0 0.0 0 0.00 5  Right: 0.0 0.00 0.0  0.0 0 0 0 0 0.0 0 0.00 0  UP: 0.0 0.00 0.0 0.0 0 0 0 0 0.0 0 0.00 0     Snoring: Total number of snoring episodes  0  Total time with snoring    min (   % of sleep)   Oximetry Distribution:             WK REM NREM TOTAL  Average (%)   93    91 92  < 90% 10.5 0.0 60.2 70.7  < 80%  0.3 0.0 0.0 0.3  < 70% 0.3 0.0 0.0 0.3  # of Desaturations* 7 0 13 20  Desat Index (#/hour) 2.0    3.3 5.2  Desat Max (%) 6 0 9 9  Desat Max Dur (sec) 28.0 0.0 99.0 99.0  Approx Min O2 during sleep 85  Approx min O2 during a respiratory event 86  Was Oxygen added (Y/N) and final rate No:   0 LPM  *Desaturations based on 3% or greater drop from baseline.   Cheyne Stokes Breathing: None Present   Heart Rate Summary:  Average Heart Rate During Sleep 79.9 bpm      Highest Heart Rate During Sleep (95th %) 83.0 bpm      Highest Heart Rate During Sleep 143 bpm      Highest Heart Rate During Recording (TIB) 255 bpm (artifact)   Heart Rate Observations: Event Type # Events   Bradycardia 0 Lowest HR Scored: N/A  Sinus Tachycardia During Sleep 0 Highest HR Scored: N/A  Narrow Complex Tachycardia 0 Highest HR Scored: N/A  Wide Complex Tachycardia 0 Highest HR Scored: N/A  Asystole 0 Longest Pause: N/A  Atrial Fibrillation 0 Duration Longest Event: N/A  Other Arrythmias  No Type:    Periodic Limb Movement Data: (Primary legs unless otherwise noted) Total # Limb Movement 263 Limb Movement Index 67.4  Total # PLMS 263 PLMS Index 67.4  Total # PLMS Arousals 10 PLMS Arousal Index 2.6  Percentage Sleep Time with PLMS 121.14min (51.8 % sleep)  Mean Duration limb movements (secs) 1455.8

## 2020-08-11 ENCOUNTER — Other Ambulatory Visit: Payer: Self-pay | Admitting: Internal Medicine

## 2020-08-20 ENCOUNTER — Ambulatory Visit: Payer: BLUE CROSS/BLUE SHIELD | Admitting: Hospice and Palliative Medicine

## 2020-08-20 ENCOUNTER — Other Ambulatory Visit: Payer: Self-pay

## 2020-08-20 ENCOUNTER — Encounter: Payer: Self-pay | Admitting: Hospice and Palliative Medicine

## 2020-08-20 VITALS — BP 124/80 | HR 86 | Temp 98.2°F | Resp 16 | Ht 58.5 in | Wt 191.0 lb

## 2020-08-20 DIAGNOSIS — I10 Essential (primary) hypertension: Secondary | ICD-10-CM

## 2020-08-20 DIAGNOSIS — E782 Mixed hyperlipidemia: Secondary | ICD-10-CM | POA: Diagnosis not present

## 2020-08-20 DIAGNOSIS — G479 Sleep disorder, unspecified: Secondary | ICD-10-CM | POA: Diagnosis not present

## 2020-08-20 DIAGNOSIS — F988 Other specified behavioral and emotional disorders with onset usually occurring in childhood and adolescence: Secondary | ICD-10-CM

## 2020-08-20 DIAGNOSIS — Z0001 Encounter for general adult medical examination with abnormal findings: Secondary | ICD-10-CM

## 2020-08-20 MED ORDER — SERTRALINE HCL 50 MG PO TABS: 50.0000 mg | ORAL_TABLET | Freq: Every day | ORAL | 1 refills | Status: DC

## 2020-08-20 MED ORDER — AMPHETAMINE-DEXTROAMPHETAMINE 30 MG PO TABS: 30.0000 mg | ORAL_TABLET | Freq: Every day | ORAL | 0 refills | Status: DC
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
## 2020-08-24 ENCOUNTER — Encounter: Payer: BLUE CROSS/BLUE SHIELD | Admitting: Internal Medicine

## 2020-08-24 ENCOUNTER — Encounter: Payer: Self-pay | Admitting: Hospice and Palliative Medicine

## 2020-08-25 ENCOUNTER — Ambulatory Visit
Admission: RE | Admit: 2020-08-25 | Discharge: 2020-08-25 | Disposition: A | Discharge status: Home / Self Care | Payer: BLUE CROSS/BLUE SHIELD | Source: Ambulatory Visit | Attending: Internal Medicine | Admitting: Internal Medicine

## 2020-08-25 ENCOUNTER — Other Ambulatory Visit: Payer: Self-pay

## 2020-08-25 DIAGNOSIS — Z1231 Encounter for screening mammogram for malignant neoplasm of breast: Secondary | ICD-10-CM | POA: Insufficient documentation

## 2020-08-26 ENCOUNTER — Other Ambulatory Visit: Payer: Self-pay | Admitting: Hospice and Palliative Medicine

## 2020-08-26 DIAGNOSIS — E875 Hyperkalemia: Secondary | ICD-10-CM

## 2020-08-26 LAB — COMPREHENSIVE METABOLIC PANEL
ALT: 20 IU/L (ref 0–32)
AST: 19 IU/L (ref 0–40)
Albumin/Globulin Ratio: 1.4 (ref 1.2–2.2)
Albumin: 4.3 g/dL (ref 3.8–4.8)
Alkaline Phosphatase: 79 IU/L (ref 44–121)
BUN/Creatinine Ratio: 25 (ref 12–28)
BUN: 18 mg/dL (ref 8–27)
Bilirubin Total: 0.6 mg/dL (ref 0.0–1.2)
CO2: 23 mmol/L (ref 20–29)
Calcium: 10 mg/dL (ref 8.7–10.3)
Chloride: 101 mmol/L (ref 96–106)
Creatinine, Ser: 0.71 mg/dL (ref 0.57–1.00)
GFR calc Af Amer: 106 mL/min/{1.73_m2} (ref >59)
GFR calc non Af Amer: 92 mL/min/{1.73_m2} (ref >59)
Globulin, Total: 3.1 g/dL (ref 1.5–4.5)
Glucose: 103 mg/dL — ABNORMAL HIGH (ref 65–99)
Potassium: 5.5 mmol/L — ABNORMAL HIGH (ref 3.5–5.2)
Sodium: 139 mmol/L (ref 134–144)
Total Protein: 7.4 g/dL (ref 6.0–8.5)

## 2020-08-26 LAB — CBC WITH DIFFERENTIAL/PLATELET
Basophils Absolute: 0.1 10*3/uL (ref 0.0–0.2)
Basos: 1 %
EOS (ABSOLUTE): 0.2 10*3/uL (ref 0.0–0.4)
Eos: 3 %
Hematocrit: 48.1 % — ABNORMAL HIGH (ref 34.0–46.6)
Hemoglobin: 16 g/dL — ABNORMAL HIGH (ref 11.1–15.9)
Immature Grans (Abs): 0 10*3/uL (ref 0.0–0.1)
Immature Granulocytes: 1 %
Lymphocytes Absolute: 1.9 10*3/uL (ref 0.7–3.1)
Lymphs: 29 %
MCH: 27.3 pg (ref 26.6–33.0)
MCHC: 33.3 g/dL (ref 31.5–35.7)
MCV: 82 fL (ref 79–97)
Monocytes Absolute: 0.6 10*3/uL (ref 0.1–0.9)
Monocytes: 9 %
Neutrophils Absolute: 3.7 10*3/uL (ref 1.4–7.0)
Neutrophils: 57 %
Platelets: 274 10*3/uL (ref 150–450)
RBC: 5.86 x10E6/uL — ABNORMAL HIGH (ref 3.77–5.28)
RDW: 12.1 % (ref 11.7–15.4)
WBC: 6.4 10*3/uL (ref 3.4–10.8)

## 2020-08-26 LAB — VITAMIN B12: Vitamin B-12: 609 pg/mL (ref 232–1245)

## 2020-08-26 LAB — TSH+FREE T4
Free T4: 1.28 ng/dL (ref 0.82–1.77)
TSH: 2.06 u[IU]/mL (ref 0.450–4.500)

## 2020-08-26 LAB — LIPID PANEL WITH LDL/HDL RATIO
Cholesterol, Total: 212 mg/dL — ABNORMAL HIGH (ref 100–199)
HDL: 63 mg/dL (ref >39)
LDL Chol Calc (NIH): 135 mg/dL — ABNORMAL HIGH (ref 0–99)
LDL/HDL Ratio: 2.1 ratio (ref 0.0–3.2)
Triglycerides: 76 mg/dL (ref 0–149)
VLDL Cholesterol Cal: 14 mg/dL (ref 5–40)

## 2020-08-26 LAB — VITAMIN D 25 HYDROXY (VIT D DEFICIENCY, FRACTURES): Vit D, 25-Hydroxy: 38.4 ng/mL (ref 30.0–100.0)

## 2020-08-27 ENCOUNTER — Encounter: Payer: BLUE CROSS/BLUE SHIELD | Admitting: Hospice and Palliative Medicine

## 2020-08-27 ENCOUNTER — Telehealth: Payer: Self-pay

## 2020-08-27 NOTE — Telephone Encounter (Signed)
Spoke with pt about labs that lab showed potassium is elevated stopped any oral if she is taking and also cholesterol is elevated limit in take for fatty fried food and increase on exercise and we can recheck again and also we put order to recheck potassium level and also advised we can check A1c at next visit

## 2020-08-29 LAB — COMPREHENSIVE METABOLIC PANEL
ALT: 21 IU/L (ref 0–32)
AST: 22 IU/L (ref 0–40)
Albumin/Globulin Ratio: 1.8 (ref 1.2–2.2)
Albumin: 4.4 g/dL (ref 3.8–4.8)
Alkaline Phosphatase: 76 IU/L (ref 44–121)
BUN/Creatinine Ratio: 29 — ABNORMAL HIGH (ref 12–28)
BUN: 19 mg/dL (ref 8–27)
Bilirubin Total: 0.4 mg/dL (ref 0.0–1.2)
CO2: 21 mmol/L (ref 20–29)
Calcium: 9.4 mg/dL (ref 8.7–10.3)
Chloride: 103 mmol/L (ref 96–106)
Creatinine, Ser: 0.66 mg/dL (ref 0.57–1.00)
GFR calc Af Amer: 110 mL/min/{1.73_m2} (ref >59)
GFR calc non Af Amer: 96 mL/min/{1.73_m2} (ref >59)
Globulin, Total: 2.4 g/dL (ref 1.5–4.5)
Glucose: 104 mg/dL — ABNORMAL HIGH (ref 65–99)
Potassium: 4.9 mmol/L (ref 3.5–5.2)
Sodium: 139 mmol/L (ref 134–144)
Total Protein: 6.8 g/dL (ref 6.0–8.5)

## 2020-09-02 ENCOUNTER — Encounter: Payer: Self-pay | Admitting: Hospice and Palliative Medicine

## 2020-09-02 ENCOUNTER — Ambulatory Visit: Payer: BLUE CROSS/BLUE SHIELD | Admitting: Hospice and Palliative Medicine

## 2020-09-02 ENCOUNTER — Other Ambulatory Visit: Payer: Self-pay

## 2020-09-02 VITALS — BP 114/76 | HR 79 | Temp 97.5°F | Resp 16 | Ht 58.5 in | Wt 192.6 lb

## 2020-09-02 DIAGNOSIS — R7301 Impaired fasting glucose: Secondary | ICD-10-CM

## 2020-09-02 DIAGNOSIS — I10 Essential (primary) hypertension: Secondary | ICD-10-CM | POA: Diagnosis not present

## 2020-09-02 DIAGNOSIS — F988 Other specified behavioral and emotional disorders with onset usually occurring in childhood and adolescence: Secondary | ICD-10-CM

## 2020-09-02 LAB — POCT GLYCOSYLATED HEMOGLOBIN (HGB A1C): Hemoglobin A1C: 5.4 % (ref 4.0–5.6)

## 2020-09-02 MED ORDER — AMPHETAMINE-DEXTROAMPHETAMINE 10 MG PO TABS: 10.0000 mg | ORAL_TABLET | Freq: Every day | ORAL | 0 refills | Status: DC | PRN

## 2020-09-02 MED ORDER — AMPHETAMINE-DEXTROAMPHETAMINE 30 MG PO TABS: 30.0000 mg | ORAL_TABLET | Freq: Every day | ORAL | 0 refills | Status: DC

## 2020-09-02 NOTE — Progress Notes (Signed)
[facility] [place], [place] [address]  Internal MEDICINE  Office Visit Note  Patient Name: Megan [name]  [id number]  [id number]  Date of Service: 09/11/2020  Chief Complaint  Patient presents with  . Hypertension  . controlled substance form    Reviewed with PT  . Follow-up    BP medicine not working    HPI Patient is here for routine follow-up Was recently started on bisoprolol for elevated BP--has been experiencing fatigue and dizziness since starting medication, she has tried taking a few days off from medication and restarting but symptoms return each time she starts medication again  She has been weaning herself off of Adderall--most days only taking 10 mg, on days that she has to work late or has a lot going on will take 30 mg dose, has also been several days without any Adderall Feels she could be more successful at continuing to wean herself off Adderall without negative side effects from bisoprolol   She is also concerned about recent labs and having elevated glucose levels, has a family history of diabetes and wants to be checked  Current Medication: Outpatient Encounter Medications as of 09/02/2020  Medication Sig  . ALPRAZolam (XANAX) 0.5 MG tablet Take 1 tablet (0.5 mg total) by mouth at bedtime as needed for anxiety.  Marland Kitchen aspirin EC 325 MG tablet Take 325 mg by mouth as needed.  . sertraline (ZOLOFT) 50 MG tablet Take 1 tablet (50 mg total) by mouth daily.  . valACYclovir (VALTREX) 500 MG tablet TAKE 1 TABLET BY MOUTH TWICE A DAY  . [DISCONTINUED] amphetamine-dextroamphetamine (ADDERALL) 30 MG tablet Take 1 tablet by mouth daily.  . [DISCONTINUED] bisoprolol (ZEBETA) 5 MG tablet Take 1 tablet (5 mg total) by mouth daily.  Marland Kitchen amphetamine-dextroamphetamine (ADDERALL) 10 MG tablet Take 1 tablet (10 mg total) by mouth daily as needed.  Marland Kitchen amphetamine-dextroamphetamine (ADDERALL) 30 MG tablet Take 1 tablet by mouth daily.   No  facility-administered encounter medications on file as of 09/02/2020.    Surgical History: Past Surgical History:  Procedure Laterality Date  . BREAST BIOPSY Left 2002   benign  . BUNIONECTOMY Right   . CESAREAN SECTION      Medical History: Past Medical History:  Diagnosis Date  . ADD (attention deficit disorder)   . Pre-hypertension     Family History: Family History  Problem Relation Age of Onset  . Lung cancer Father   . Diabetes Sister   . Breast cancer Neg Hx     Social History   Socioeconomic History  . Marital status: Married    Spouse name: Not on file  . Number of children: Not on file  . Years of education: Not on file  . Highest education level: Not on file  Occupational History  . Not on file  Tobacco Use  . Smoking status: Never Smoker  . Smokeless tobacco: Never Used  Vaping Use  . Vaping Use: Never used  Substance and Sexual Activity  . Alcohol use: Yes    Comment: social  . Drug use: Not Currently    Types: Marijuana    Comment: once in a while   . Sexual activity: Not on file  Other Topics Concern  . Not on file  Social History Narrative  . Not on file   Social Determinants of Health   Financial Resource Strain: Not on file  Food Insecurity: Not on file  Transportation Needs: Not on file  Physical Activity: Not  on file  Stress: Not on file  Social Connections: Not on file  Intimate Partner Violence: Not on file      Review of Systems  Constitutional: Positive for fatigue. Negative for chills and diaphoresis.  HENT: Negative for ear pain, postnasal drip and sinus pressure.   Eyes: Negative for photophobia, discharge, redness, itching and visual disturbance.  Respiratory: Negative for cough, shortness of breath and wheezing.   Cardiovascular: Negative for chest pain, palpitations and leg swelling.  Gastrointestinal: Negative for abdominal pain, constipation, diarrhea, nausea and vomiting.  Genitourinary: Negative for dysuria  and flank pain.  Musculoskeletal: Negative for arthralgias, back pain, gait problem and neck pain.  Skin: Negative for color change.  Allergic/Immunologic: Negative for environmental allergies and food allergies.  Neurological: Positive for dizziness. Negative for headaches.  Hematological: Does not bruise/bleed easily.  Psychiatric/Behavioral: Negative for agitation, behavioral problems (depression) and hallucinations.    Vital Signs: BP 114/76   Pulse 79   Temp (!) 97.5 F (36.4 C)   Resp 16   Ht 4' 10.5" (1.486 m)   Wt 192 lb 9.6 oz (87.4 kg)   SpO2 98%   BMI 39.57 kg/m    Physical Exam Constitutional:      Appearance: Normal appearance. She is obese.  Cardiovascular:     Rate and Rhythm: Normal rate and regular rhythm.     Pulses: Normal pulses.     Heart sounds: Normal heart sounds.  Pulmonary:     Effort: Pulmonary effort is normal.     Breath sounds: Normal breath sounds.  Musculoskeletal:        General: Normal range of motion.  Skin:    General: Skin is warm.  Neurological:     General: No focal deficit present.     Mental Status: She is alert and oriented to person, place, and time. Mental status is at baseline.  Psychiatric:        Mood and Affect: Mood normal.        Behavior: Behavior normal.        Thought Content: Thought content normal.        Judgment: Judgment normal.    Assessment/Plan: 1. Impaired fasting glucose A1C 5.4--normal, discussed importance of weight loss and healthy eating as she has a strong family history of diabetes, will continue to monitor labs - POCT HgB A1C  2. Essential hypertension BP and HR well controlled today--has recently started taking bisoprolol again, maybe 2-3 days, continues to have negative side effects BP seems to have improved with weaning off of adderall, due to negative side effects will stop bisoprolol and closely monitor BP  3. Attention deficit disorder (ADD) without hyperactivity Continue with weaning  of Adderall - amphetamine-dextroamphetamine (ADDERALL) 30 MG tablet; Take 1 tablet by mouth daily.  Dispense: 30 tablet; Refill: 0 - amphetamine-dextroamphetamine (ADDERALL) 10 MG tablet; Take 1 tablet (10 mg total) by mouth daily as needed.  Dispense: 30 tablet; Refill: 0  General Counseling: Avishka verbalizes understanding of the findings of todays visit and agrees with plan of treatment. I have discussed any further diagnostic evaluation that may be needed or ordered today. We also reviewed her medications today. she has been encouraged to call the office with any questions or concerns that should arise related to todays visit.    Orders Placed This Encounter  Procedures  . POCT HgB A1C    Meds ordered this encounter  Medications  . amphetamine-dextroamphetamine (ADDERALL) 30 MG tablet    Sig: Take 1  tablet by mouth daily.    Dispense:  30 tablet    Refill:  0  . amphetamine-dextroamphetamine (ADDERALL) 10 MG tablet    Sig: Take 1 tablet (10 mg total) by mouth daily as needed.    Dispense:  30 tablet    Refill:  0    Time spent: 30 Minutes Time spent includes review of chart, medications, test results and follow-up plan with the patient.  This patient was seen by Theodoro Grist AGNP-C in Collaboration with Dr Lavera Guise as a part of collaborative care agreement     Tanna Furry. Ciarah Peace AGNP-C Internal medicine

## 2020-09-11 ENCOUNTER — Encounter: Payer: Self-pay | Admitting: Hospice and Palliative Medicine

## 2020-09-16 ENCOUNTER — Ambulatory Visit: Payer: BLUE CROSS/BLUE SHIELD | Admitting: Hospice and Palliative Medicine

## 2020-09-30 ENCOUNTER — Encounter: Payer: Self-pay | Admitting: Hospice and Palliative Medicine

## 2020-09-30 ENCOUNTER — Other Ambulatory Visit: Payer: Self-pay

## 2020-09-30 ENCOUNTER — Ambulatory Visit (INDEPENDENT_AMBULATORY_CARE_PROVIDER_SITE_OTHER): Payer: BLUE CROSS/BLUE SHIELD | Admitting: Hospice and Palliative Medicine

## 2020-09-30 VITALS — BP 147/106 | HR 116 | Temp 97.5°F | Resp 16 | Ht 58.5 in | Wt 190.4 lb

## 2020-09-30 DIAGNOSIS — G479 Sleep disorder, unspecified: Secondary | ICD-10-CM

## 2020-09-30 DIAGNOSIS — B009 Herpesviral infection, unspecified: Secondary | ICD-10-CM | POA: Diagnosis not present

## 2020-09-30 DIAGNOSIS — Z6839 Body mass index (BMI) 39.0-39.9, adult: Secondary | ICD-10-CM

## 2020-09-30 DIAGNOSIS — F988 Other specified behavioral and emotional disorders with onset usually occurring in childhood and adolescence: Secondary | ICD-10-CM

## 2020-09-30 DIAGNOSIS — R7301 Impaired fasting glucose: Secondary | ICD-10-CM

## 2020-09-30 DIAGNOSIS — I10 Essential (primary) hypertension: Secondary | ICD-10-CM

## 2020-09-30 DIAGNOSIS — E6609 Other obesity due to excess calories: Secondary | ICD-10-CM

## 2020-09-30 LAB — POCT GLYCOSYLATED HEMOGLOBIN (HGB A1C): Hemoglobin A1C: 5.4 % (ref 4.0–5.6)

## 2020-09-30 MED ORDER — AMPHETAMINE-DEXTROAMPHETAMINE 30 MG PO TABS: 30.0000 mg | ORAL_TABLET | Freq: Every day | ORAL | 0 refills | Status: DC

## 2020-09-30 MED ORDER — ALPRAZOLAM 0.5 MG PO TABS: 0.5000 mg | ORAL_TABLET | Freq: Every evening | ORAL | 1 refills | Status: DC | PRN

## 2020-09-30 MED ORDER — AMPHETAMINE-DEXTROAMPHETAMINE 10 MG PO TABS
10.0000 mg | ORAL_TABLET | Freq: Every day | ORAL | 0 refills | Status: DC | PRN
Start: 2020-09-30 — End: 2020-12-21

## 2020-09-30 MED ORDER — HYDROCHLOROTHIAZIDE 12.5 MG PO TABS: 12.5000 mg | ORAL_TABLET | Freq: Every day | ORAL | 0 refills | Status: DC

## 2020-09-30 MED ORDER — VALACYCLOVIR HCL 500 MG PO TABS: 500.0000 mg | ORAL_TABLET | Freq: Two times a day (BID) | ORAL | 0 refills | Status: DC

## 2020-09-30 NOTE — Progress Notes (Signed)
[facility] [place], [place] [address]  Internal MEDICINE  Office Visit Note  Patient Name: Megan [name]  [id number]  [id number]  Date of Service: 10/03/2020  Chief Complaint  Patient presents with  . Follow-up    Giving meds that raise BP  . Medication Refill    HPI Patient is here for routine follow-up BP-she monitors her BP daily at home, has been averaging 140/80's, she was given Meloxicam by ortho due to right knee injury which she feels has been causing further elevation in her BP Continues to use Adderall as needed, has continued to wean herself down, there are days that she does not take medication--when she is not working and doesn't have anything planned for the day, will take on days that she is working or has several appointments to help with concentration and focus  Also continues to have issues with sleeping, issue is intermittent, some nights she sleeps well and others she only sleeps 2-3 hours per night-not related to taking Adderall, noticed several nights she still has issues when she has not had Adderall that day  Has been tried on HCTZ in the past but she was unable to tolerate as it caused he to be fatigued--was taking medicine in AM  Current Medication: Outpatient Encounter Medications as of 09/30/2020  Medication Sig  . hydrochlorothiazide (HYDRODIURIL) 12.5 MG tablet Take 1 tablet (12.5 mg total) by mouth daily.  Marland Kitchen ALPRAZolam (XANAX) 0.5 MG tablet Take 1 tablet (0.5 mg total) by mouth at bedtime as needed for anxiety.  Marland Kitchen amphetamine-dextroamphetamine (ADDERALL) 10 MG tablet Take 1 tablet (10 mg total) by mouth daily as needed.  Marland Kitchen amphetamine-dextroamphetamine (ADDERALL) 30 MG tablet Take 1 tablet by mouth daily.  Marland Kitchen aspirin EC 325 MG tablet Take 325 mg by mouth as needed.  . valACYclovir (VALTREX) 500 MG tablet Take 1 tablet (500 mg total) by mouth 2 (two) times daily.  . [DISCONTINUED] ALPRAZolam (XANAX) 0.5 MG tablet Take 1  tablet (0.5 mg total) by mouth at bedtime as needed for anxiety.  . [DISCONTINUED] amphetamine-dextroamphetamine (ADDERALL) 10 MG tablet Take 1 tablet (10 mg total) by mouth daily as needed.  . [DISCONTINUED] amphetamine-dextroamphetamine (ADDERALL) 30 MG tablet Take 1 tablet by mouth daily.  . [DISCONTINUED] sertraline (ZOLOFT) 50 MG tablet Take 1 tablet (50 mg total) by mouth daily.  . [DISCONTINUED] valACYclovir (VALTREX) 500 MG tablet TAKE 1 TABLET BY MOUTH TWICE A DAY   No facility-administered encounter medications on file as of 09/30/2020.    Surgical History: Past Surgical History:  Procedure Laterality Date  . BREAST BIOPSY Left 2002   benign  . BUNIONECTOMY Right   . CESAREAN SECTION      Medical History: Past Medical History:  Diagnosis Date  . ADD (attention deficit disorder)   . Pre-hypertension     Family History: Family History  Problem Relation Age of Onset  . Lung cancer Father   . Diabetes Sister   . Breast cancer Neg Hx     Social History   Socioeconomic History  . Marital status: Married    Spouse name: Not on file  . Number of children: Not on file  . Years of education: Not on file  . Highest education level: Not on file  Occupational History  . Not on file  Tobacco Use  . Smoking status: Never Smoker  . Smokeless tobacco: Never Used  Vaping Use  . Vaping Use: Never used  Substance and Sexual Activity  .  Alcohol use: Yes    Comment: social  . Drug use: Not Currently    Types: Marijuana    Comment: once in a while   . Sexual activity: Not on file  Other Topics Concern  . Not on file  Social History Narrative  . Not on file   Social Determinants of Health   Financial Resource Strain: Not on file  Food Insecurity: Not on file  Transportation Needs: Not on file  Physical Activity: Not on file  Stress: Not on file  Social Connections: Not on file  Intimate Partner Violence: Not on file      Review of Systems  Constitutional:  Negative for chills, diaphoresis and fatigue.  HENT: Negative for ear pain, postnasal drip and sinus pressure.   Eyes: Negative for photophobia, discharge, redness, itching and visual disturbance.  Respiratory: Negative for cough, shortness of breath and wheezing.   Cardiovascular: Negative for chest pain, palpitations and leg swelling.  Gastrointestinal: Negative for abdominal pain, constipation, diarrhea, nausea and vomiting.  Genitourinary: Negative for dysuria and flank pain.  Musculoskeletal: Negative for arthralgias, back pain, gait problem and neck pain.       Right knee pain from recent injury from fall at work--followed by ortho, knee brace on  Skin: Negative for color change.  Allergic/Immunologic: Negative for environmental allergies and food allergies.  Neurological: Negative for dizziness and headaches.  Hematological: Does not bruise/bleed easily.  Psychiatric/Behavioral: Positive for sleep disturbance. Negative for agitation, behavioral problems (depression) and hallucinations.    Vital Signs: BP (!) 147/106   Pulse (!) 116   Temp (!) 97.5 F (36.4 C)   Resp 16   Ht 4' 10.5" (1.486 m)   Wt 190 lb 6.4 oz (86.4 kg)   SpO2 99%   BMI 39.12 kg/m    Physical Exam Vitals reviewed.  Constitutional:      Appearance: She is obese.  Cardiovascular:     Rate and Rhythm: Normal rate and regular rhythm.     Pulses: Normal pulses.     Heart sounds: Normal heart sounds.  Pulmonary:     Effort: Pulmonary effort is normal.     Breath sounds: Normal breath sounds.  Abdominal:     General: Abdomen is flat.     Palpations: Abdomen is soft.  Musculoskeletal:        General: Normal range of motion.     Cervical back: Normal range of motion.  Skin:    General: Skin is warm.  Neurological:     General: No focal deficit present.     Mental Status: She is alert and oriented to person, place, and time. Mental status is at baseline.  Psychiatric:        Mood and Affect: Mood  normal.        Behavior: Behavior normal.        Thought Content: Thought content normal.        Judgment: Judgment normal.    Assessment/Plan: 1. Impaired fasting glucose A1C remains at 5.4--stable May consider adding Rybelsus at next visit to help maintain A1C and assist with weight loss - POCT HgB A1C  2. HSV (herpes simplex virus) infection Stable, requesting refills - valACYclovir (VALTREX) 500 MG tablet; Take 1 tablet (500 mg total) by mouth 2 (two) times daily.  Dispense: 30 tablet; Refill: 0  3. Sleep disturbance Continue with current low dose alprazolam as needed for insomnia - ALPRAZolam (XANAX) 0.5 MG tablet; Take 1 tablet (0.5 mg total) by mouth  at bedtime as needed for anxiety.  Dispense: 30 tablet; Refill: 1  4. Attention deficit disorder (ADD) without hyperactivity Continue with current Adderall regimen and weaning process-take as needed only - amphetamine-dextroamphetamine (ADDERALL) 10 MG tablet; Take 1 tablet (10 mg total) by mouth daily as needed.  Dispense: 30 tablet; Refill: 0 - amphetamine-dextroamphetamine (ADDERALL) 30 MG tablet; Take 1 tablet by mouth daily.  Dispense: 30 tablet; Refill: 0  5. Essential hypertension Start 12.5 mg HCTZ--advised to take at night since it has caused fatigue in the past, may need to titrate dose based on response in 1 month Closely monitor  6. Class 2 obesity due to excess calories without serious comorbidity with body mass index (BMI) of 39.0 to 39.9 in adult Discussed the importance of weight management through healthy eating and daily exercise as tolerated. Discussed the negative effects obesity has on pulmonary health, cardiac health as well as overall general health and well being. Obesity Counseling: Risk Assessment: An assessment of behavioral risk factors was made today and includes lack of exercise sedentary lifestyle, lack of portion control and poor dietary habits.  Risk Modification Advice: She was counseled on  portion control guidelines. Restricting daily caloric intake to 1800. The detrimental long term effects of obesity on her health and ongoing poor compliance was also discussed with the patient.   General Counseling: alura santone understanding of the findings of todays visit and agrees with plan of treatment. I have discussed any further diagnostic evaluation that may be needed or ordered today. We also reviewed her medications today. she has been encouraged to call the office with any questions or concerns that should arise related to todays visit.    Orders Placed This Encounter  Procedures  . POCT HgB A1C    Meds ordered this encounter  Medications  . hydrochlorothiazide (HYDRODIURIL) 12.5 MG tablet    Sig: Take 1 tablet (12.5 mg total) by mouth daily.    Dispense:  90 tablet    Refill:  0  . valACYclovir (VALTREX) 500 MG tablet    Sig: Take 1 tablet (500 mg total) by mouth 2 (two) times daily.    Dispense:  30 tablet    Refill:  0  . ALPRAZolam (XANAX) 0.5 MG tablet    Sig: Take 1 tablet (0.5 mg total) by mouth at bedtime as needed for anxiety.    Dispense:  30 tablet    Refill:  1  . amphetamine-dextroamphetamine (ADDERALL) 10 MG tablet    Sig: Take 1 tablet (10 mg total) by mouth daily as needed.    Dispense:  30 tablet    Refill:  0  . amphetamine-dextroamphetamine (ADDERALL) 30 MG tablet    Sig: Take 1 tablet by mouth daily.    Dispense:  30 tablet    Refill:  0    Time spent: 30 Minutes Time spent includes review of chart, medications, test results and follow-up plan with the patient.  This patient was seen by Theodoro Grist AGNP-C in Collaboration with Dr Lavera Guise as a part of collaborative care agreement     Tanna Furry. Chanise Habeck AGNP-C Internal medicine

## 2020-10-03 ENCOUNTER — Encounter: Payer: Self-pay | Admitting: Hospice and Palliative Medicine

## 2020-10-28 ENCOUNTER — Ambulatory Visit: Payer: BLUE CROSS/BLUE SHIELD | Admitting: Hospice and Palliative Medicine

## 2020-10-28 ENCOUNTER — Other Ambulatory Visit: Payer: Self-pay

## 2020-10-28 ENCOUNTER — Encounter: Payer: Self-pay | Admitting: Hospice and Palliative Medicine

## 2020-10-28 VITALS — BP 138/90 | HR 92 | Temp 97.1°F | Resp 16 | Ht <= 58 in | Wt 187.0 lb

## 2020-10-28 DIAGNOSIS — R000 Tachycardia, unspecified: Secondary | ICD-10-CM

## 2020-10-28 DIAGNOSIS — F988 Other specified behavioral and emotional disorders with onset usually occurring in childhood and adolescence: Secondary | ICD-10-CM

## 2020-10-28 DIAGNOSIS — F5101 Primary insomnia: Secondary | ICD-10-CM

## 2020-10-28 DIAGNOSIS — G479 Sleep disorder, unspecified: Secondary | ICD-10-CM

## 2020-10-28 MED ORDER — AMPHETAMINE-DEXTROAMPHETAMINE 30 MG PO TABS
30.0000 mg | ORAL_TABLET | Freq: Every day | ORAL | 0 refills | Status: DC
Start: 2020-10-28 — End: 2020-12-21

## 2020-10-28 MED ORDER — ALPRAZOLAM 0.5 MG PO TABS: 0.5000 mg | ORAL_TABLET | Freq: Every evening | ORAL | 1 refills | Status: DC | PRN

## 2020-10-28 NOTE — Progress Notes (Signed)
[facility] [place], [place] [address]  Internal MEDICINE  Office Visit Note  Patient Name: Megan [name]  [id number]  [id number]  Date of Service: 10/31/2020  Chief Complaint  Patient presents with  . Follow-up    Discuss last ekg, heart rate has been going up even when sitting for a while    HPI Patient is here for routine follow-up Has started appropriate PT exercises for right knee injury, meniscal injury--followed by ortho, continues to take Meloxicam for pain management Continues to be successful at weaning herself from Adderall, going more days without medication and more days with only taking lowered dose Since our last visit she has not needed both doses in a single day Also weaning down use of alprazolam, monitoring her sleeping habits, will only take alprazolam when she has been without sleep for a few days in a row Has also been monitoring her heart rate, at rest she has noticed several occasions when her heart rate will elevated to 120-130--unaware, denies chest pain or palpitations Echocardiogram in November evidence of diastolic dysfunction, negative for OSA on sleep study  Current Medication: Outpatient Encounter Medications as of 10/28/2020  Medication Sig  . amphetamine-dextroamphetamine (ADDERALL) 10 MG tablet Take 1 tablet (10 mg total) by mouth daily as needed.  . hydrochlorothiazide (HYDRODIURIL) 12.5 MG tablet Take 1 tablet (12.5 mg total) by mouth daily.  . meloxicam (MOBIC) 7.5 MG tablet Take 7.5 mg by mouth 2 (two) times daily.  . valACYclovir (VALTREX) 500 MG tablet Take 1 tablet (500 mg total) by mouth 2 (two) times daily.  . [DISCONTINUED] ALPRAZolam (XANAX) 0.5 MG tablet Take 1 tablet (0.5 mg total) by mouth at bedtime as needed for anxiety.  . [DISCONTINUED] amphetamine-dextroamphetamine (ADDERALL) 30 MG tablet Take 1 tablet by mouth daily.  Marland Kitchen ALPRAZolam (XANAX) 0.5 MG tablet Take 1 tablet (0.5 mg total) by mouth at  bedtime as needed for anxiety.  Marland Kitchen amphetamine-dextroamphetamine (ADDERALL) 30 MG tablet Take 1 tablet by mouth daily.  . [DISCONTINUED] aspirin EC 325 MG tablet Take 325 mg by mouth as needed. (Patient not taking: Reported on 10/28/2020)   No facility-administered encounter medications on file as of 10/28/2020.    Surgical History: Past Surgical History:  Procedure Laterality Date  . BREAST BIOPSY Left 2002   benign  . BUNIONECTOMY Right   . CESAREAN SECTION      Medical History: Past Medical History:  Diagnosis Date  . ADD (attention deficit disorder)   . Pre-hypertension     Family History: Family History  Problem Relation Age of Onset  . Lung cancer Father   . Diabetes Sister   . Breast cancer Neg Hx     Social History   Socioeconomic History  . Marital status: Married    Spouse name: Not on file  . Number of children: Not on file  . Years of education: Not on file  . Highest education level: Not on file  Occupational History  . Not on file  Tobacco Use  . Smoking status: Never Smoker  . Smokeless tobacco: Never Used  Vaping Use  . Vaping Use: Never used  Substance and Sexual Activity  . Alcohol use: Yes    Comment: social  . Drug use: Not Currently    Types: Marijuana    Comment: once in a while   . Sexual activity: Not on file  Other Topics Concern  . Not on file  Social History Narrative  . Not on file  Social Determinants of Health   Financial Resource Strain: Not on file  Food Insecurity: Not on file  Transportation Needs: Not on file  Physical Activity: Not on file  Stress: Not on file  Social Connections: Not on file  Intimate Partner Violence: Not on file      Review of Systems  Constitutional: Negative for chills, diaphoresis and fatigue.  HENT: Negative for ear pain, postnasal drip and sinus pressure.   Eyes: Negative for photophobia, discharge, redness, itching and visual disturbance.  Respiratory: Negative for cough, shortness  of breath and wheezing.   Cardiovascular: Negative for chest pain, palpitations and leg swelling.  Gastrointestinal: Negative for abdominal pain, constipation, diarrhea, nausea and vomiting.  Genitourinary: Negative for dysuria and flank pain.  Musculoskeletal: Negative for arthralgias, back pain, gait problem and neck pain.       Right knee pain from recent injury  Skin: Negative for color change.  Allergic/Immunologic: Negative for environmental allergies and food allergies.  Neurological: Negative for dizziness and headaches.  Hematological: Does not bruise/bleed easily.  Psychiatric/Behavioral: Negative for agitation, behavioral problems (depression) and hallucinations.    Vital Signs: BP 138/90   Pulse 92   Temp (!) 97.1 F (36.2 C)   Resp 16   Ht 4\' 10"  (1.473 m)   Wt 187 lb (84.8 kg)   SpO2 99%   BMI 39.08 kg/m    Physical Exam Vitals reviewed.  Constitutional:      Appearance: Normal appearance. She is obese.  Cardiovascular:     Rate and Rhythm: Normal rate and regular rhythm.     Pulses: Normal pulses.     Heart sounds: Normal heart sounds.  Pulmonary:     Effort: Pulmonary effort is normal.     Breath sounds: Normal breath sounds.  Abdominal:     General: Abdomen is flat.     Palpations: Abdomen is soft.  Musculoskeletal:        General: Normal range of motion.     Cervical back: Normal range of motion.  Skin:    General: Skin is warm.  Neurological:     General: No focal deficit present.     Mental Status: She is alert and oriented to person, place, and time. Mental status is at baseline.  Psychiatric:        Mood and Affect: Mood normal.        Behavior: Behavior normal.        Thought Content: Thought content normal.        Judgment: Judgment normal.    Assessment/Plan: 1. Tachycardia Home readings from body monitor (ring) of elevated HR at rest Will attempt to capture and review underlying rhythm with event monitoring - Holter monitor - 24  hour; Future  2. Primary insomnia Continue with alprazolam as needed for insomnia, encouraged to continue with weaning - ALPRAZolam (XANAX) 0.5 MG tablet; Take 1 tablet (0.5 mg total) by mouth at bedtime as needed for anxiety.  Dispense: 30 tablet; Refill: 1  3. Attention deficit disorder (ADD) without hyperactivity Encouraged to continue with weaning down dose and frequency of adderall [place] Controlled Substance Database was reviewed by me for overdose risk score (ORS) - amphetamine-dextroamphetamine (ADDERALL) 30 MG tablet; Take 1 tablet by mouth daily.  Dispense: 30 tablet; Refill: 0  General Counseling: Ketra verbalizes understanding of the findings of todays visit and agrees with plan of treatment. I have discussed any further diagnostic evaluation that may be needed or ordered today. We also reviewed her medications  today. she has been encouraged to call the office with any questions or concerns that should arise related to todays visit.    Orders Placed This Encounter  Procedures  . Holter monitor - 24 hour    Meds ordered this encounter  Medications  . ALPRAZolam (XANAX) 0.5 MG tablet    Sig: Take 1 tablet (0.5 mg total) by mouth at bedtime as needed for anxiety.    Dispense:  30 tablet    Refill:  1  . amphetamine-dextroamphetamine (ADDERALL) 30 MG tablet    Sig: Take 1 tablet by mouth daily.    Dispense:  30 tablet    Refill:  0    Time spent: 30 Minutes Time spent includes review of chart, medications, test results and follow-up plan with the patient.  This patient was seen by Theodoro Grist AGNP-C in Collaboration with Dr Lavera Guise as a part of collaborative care agreement     Tanna Furry. Angeni Chaudhuri AGNP-C Internal medicine

## 2020-10-31 ENCOUNTER — Encounter: Payer: Self-pay | Admitting: Hospice and Palliative Medicine

## 2020-11-03 ENCOUNTER — Other Ambulatory Visit: Payer: Self-pay | Admitting: Internal Medicine

## 2020-11-03 ENCOUNTER — Encounter: Payer: Self-pay | Admitting: Hospice and Palliative Medicine

## 2020-11-03 ENCOUNTER — Other Ambulatory Visit: Payer: Self-pay | Admitting: Hospice and Palliative Medicine

## 2020-11-03 DIAGNOSIS — R000 Tachycardia, unspecified: Secondary | ICD-10-CM

## 2020-11-03 DIAGNOSIS — B009 Herpesviral infection, unspecified: Secondary | ICD-10-CM

## 2020-11-03 NOTE — Telephone Encounter (Signed)
I put a referral for cardiology

## 2020-11-14 ENCOUNTER — Other Ambulatory Visit: Payer: Self-pay | Admitting: Hospice and Palliative Medicine

## 2020-11-14 DIAGNOSIS — I10 Essential (primary) hypertension: Secondary | ICD-10-CM

## 2020-11-19 ENCOUNTER — Other Ambulatory Visit: Payer: Self-pay | Admitting: Internal Medicine

## 2020-11-19 DIAGNOSIS — B009 Herpesviral infection, unspecified: Secondary | ICD-10-CM

## 2020-12-11 ENCOUNTER — Other Ambulatory Visit: Payer: Self-pay | Admitting: Adult Health

## 2020-12-11 DIAGNOSIS — E559 Vitamin D deficiency, unspecified: Secondary | ICD-10-CM

## 2020-12-21 ENCOUNTER — Encounter: Payer: Self-pay | Admitting: Hospice and Palliative Medicine

## 2020-12-21 ENCOUNTER — Ambulatory Visit: Payer: BLUE CROSS/BLUE SHIELD | Admitting: Hospice and Palliative Medicine

## 2020-12-21 ENCOUNTER — Other Ambulatory Visit: Payer: Self-pay

## 2020-12-21 VITALS — BP 138/84 | HR 100 | Temp 97.6°F | Resp 16 | Ht 58.5 in | Wt 186.6 lb

## 2020-12-21 DIAGNOSIS — D229 Melanocytic nevi, unspecified: Secondary | ICD-10-CM | POA: Diagnosis not present

## 2020-12-21 DIAGNOSIS — Z79899 Other long term (current) drug therapy: Secondary | ICD-10-CM

## 2020-12-21 DIAGNOSIS — F5101 Primary insomnia: Secondary | ICD-10-CM | POA: Diagnosis not present

## 2020-12-21 DIAGNOSIS — F988 Other specified behavioral and emotional disorders with onset usually occurring in childhood and adolescence: Secondary | ICD-10-CM

## 2020-12-21 DIAGNOSIS — I10 Essential (primary) hypertension: Secondary | ICD-10-CM | POA: Diagnosis not present

## 2020-12-21 DIAGNOSIS — G4761 Periodic limb movement disorder: Secondary | ICD-10-CM

## 2020-12-21 LAB — POCT URINE DRUG SCREEN
Methylenedioxyamphetamine: NOT DETECTED
POC Amphetamine UR: POSITIVE — AB
POC BENZODIAZEPINES UR: NOT DETECTED
POC Barbiturate UR: NOT DETECTED
POC Cocaine UR: NOT DETECTED
POC Ecstasy UR: NOT DETECTED
POC Marijuana UR: NOT DETECTED
POC Methadone UR: NOT DETECTED
POC Methamphetamine UR: NOT DETECTED
POC Opiate Ur: NOT DETECTED
POC Oxycodone UR: NOT DETECTED
POC PHENCYCLIDINE UR: NOT DETECTED
POC TRICYCLICS UR: NOT DETECTED

## 2020-12-21 MED ORDER — ROPINIROLE HCL 0.25 MG PO TABS: 0.2500 mg | ORAL_TABLET | Freq: Every day | ORAL | 1 refills | Status: DC

## 2020-12-21 MED ORDER — ALPRAZOLAM 0.5 MG PO TABS: 0.5000 mg | ORAL_TABLET | Freq: Every evening | ORAL | 1 refills | Status: DC | PRN

## 2020-12-21 MED ORDER — AMPHETAMINE-DEXTROAMPHETAMINE 10 MG PO TABS: 10.0000 mg | ORAL_TABLET | Freq: Every day | ORAL | 0 refills | Status: DC | PRN

## 2020-12-21 MED ORDER — AMPHETAMINE-DEXTROAMPHETAMINE 30 MG PO TABS: 30.0000 mg | ORAL_TABLET | Freq: Every day | ORAL | 0 refills | Status: DC

## 2020-12-21 NOTE — Progress Notes (Signed)
[facility] [place], [place] [address]  Internal MEDICINE  Office Visit Note  Patient Name: Megan [name]  [id number]  [id number]  Date of Service: 12/22/2020  Chief Complaint  Patient presents with  . Follow-up    Insomnia gotten worse recently, refill request     HPI Patient is here for routine follow-up Scheduled for cardiology office visit in the morning in [facility] for cardiac work-up from experiencing palpitations, attempted to set up with [employer] monitoring but insurance denies and prefers [facility] providers Has been struggling with her sleep, has weaned down her alprazolam, has not taken in over a month but her sleep has been very poor Reviewed her sleep study, significant for periodic limb movements, has been tracking her sleep patterns at home and does not get "restful" sleep Denies restless sensation in her legs in the evenings, does not recall awakening to restless feelings in her legs throughout the night PT for her knee has been helpful, takes meloxicam as needed for pain especially when she is working a double shift Continues to take Adderall as needed, primarily only takes 10 mg of Adderall, will take 30 mg and at times an additional 10 mg when she is working a double shift, [place] Has stopped her BP medications for now, wanting to wait until she is seen by cardiologist  Current Medication: Outpatient Encounter Medications as of 12/21/2020  Medication Sig  . rOPINIRole (REQUIP) 0.25 MG tablet Take 1 tablet (0.25 mg total) by mouth at bedtime.  . ALPRAZolam (XANAX) 0.5 MG tablet Take 1 tablet (0.5 mg total) by mouth at bedtime as needed for anxiety.  Marland Kitchen amphetamine-dextroamphetamine (ADDERALL) 10 MG tablet Take 1 tablet (10 mg total) by mouth daily as needed.  Marland Kitchen amphetamine-dextroamphetamine (ADDERALL) 30 MG tablet Take 1 tablet by mouth daily.  . bisoprolol (ZEBETA) 5 MG tablet TAKE 1 TABLET BY MOUTH EVERY DAY  . hydrochlorothiazide  (HYDRODIURIL) 12.5 MG tablet Take 1 tablet (12.5 mg total) by mouth daily.  . meloxicam (MOBIC) 7.5 MG tablet Take 7.5 mg by mouth 2 (two) times daily.  . valACYclovir (VALTREX) 500 MG tablet TAKE 1 TABLET BY MOUTH TWICE A DAY  . [DISCONTINUED] ALPRAZolam (XANAX) 0.5 MG tablet Take 1 tablet (0.5 mg total) by mouth at bedtime as needed for anxiety.  . [DISCONTINUED] amphetamine-dextroamphetamine (ADDERALL) 10 MG tablet Take 1 tablet (10 mg total) by mouth daily as needed.  . [DISCONTINUED] amphetamine-dextroamphetamine (ADDERALL) 30 MG tablet Take 1 tablet by mouth daily.   No facility-administered encounter medications on file as of 12/21/2020.    Surgical History: Past Surgical History:  Procedure Laterality Date  . BREAST BIOPSY Left 2002   benign  . BUNIONECTOMY Right   . CESAREAN SECTION      Medical History: Past Medical History:  Diagnosis Date  . ADD (attention deficit disorder)   . Pre-hypertension     Family History: Family History  Problem Relation Age of Onset  . Lung cancer Father   . Diabetes Sister   . Breast cancer Neg Hx     Social History   Socioeconomic History  . Marital status: Married    Spouse name: Not on file  . Number of children: Not on file  . Years of education: Not on file  . Highest education level: Not on file  Occupational History  . Not on file  Tobacco Use  . Smoking status: Never Smoker  . Smokeless tobacco: Never Used  Vaping Use  . Vaping  Use: Never used  Substance and Sexual Activity  . Alcohol use: Yes    Comment: social  . Drug use: Not Currently    Types: Marijuana    Comment: once in a while   . Sexual activity: Not on file  Other Topics Concern  . Not on file  Social History Narrative  . Not on file   Social Determinants of Health   Financial Resource Strain: Not on file  Food Insecurity: Not on file  Transportation Needs: Not on file  Physical Activity: Not on file  Stress: Not on file  Social Connections:  Not on file  Intimate Partner Violence: Not on file      Review of Systems  Constitutional: Positive for fatigue. Negative for chills and diaphoresis.  HENT: Negative for ear pain, postnasal drip and sinus pressure.   Eyes: Negative for photophobia, discharge, redness, itching and visual disturbance.  Respiratory: Negative for cough, shortness of breath and wheezing.   Cardiovascular: Negative for chest pain, palpitations and leg swelling.  Gastrointestinal: Negative for abdominal pain, constipation, diarrhea, nausea and vomiting.  Genitourinary: Negative for dysuria and flank pain.  Musculoskeletal: Negative for arthralgias, back pain, gait problem and neck pain.  Skin: Negative for color change.  Allergic/Immunologic: Negative for environmental allergies and food allergies.  Neurological: Negative for dizziness and headaches.  Hematological: Does not bruise/bleed easily.  Psychiatric/Behavioral: Positive for decreased concentration and sleep disturbance. Negative for agitation, behavioral problems (depression) and hallucinations.    Vital Signs: BP 138/84   Pulse 100   Temp 97.6 F (36.4 C)   Resp 16   Ht 4' 10.5" (1.486 m)   Wt 186 lb 9.6 oz (84.6 kg)   SpO2 98%   BMI 38.34 kg/m    Physical Exam Vitals reviewed.  Constitutional:      Appearance: Normal appearance. She is obese.  Cardiovascular:     Rate and Rhythm: Normal rate and regular rhythm.     Pulses: Normal pulses.     Heart sounds: Normal heart sounds.  Pulmonary:     Effort: Pulmonary effort is normal.     Breath sounds: Normal breath sounds.  Abdominal:     General: Abdomen is flat.     Palpations: Abdomen is soft.  Musculoskeletal:        General: Normal range of motion.     Cervical back: Normal range of motion.  Skin:    Comments: Recurring non-healing blister to left side of forehead  Neurological:     General: No focal deficit present.     Mental Status: She is alert and oriented to person,  place, and time. Mental status is at baseline.  Psychiatric:        Mood and Affect: Mood normal.        Behavior: Behavior normal.        Thought Content: Thought content normal.        Judgment: Judgment normal.    Assessment/Plan: 1. Essential hypertension BP and HR controlled, scheduled for cardiology evaluation at [facility] tomorrow  2. Atypical mole - Ambulatory referral to Dermatology  3. Periodic limb movement disorder (PLMD) Frequent PLMS noted on PSG, continues to have issues with insomnia, will try low dose ropinirole at bedtime, remains asymptomatic - rOPINIRole (REQUIP) 0.25 MG tablet; Take 1 tablet (0.25 mg total) by mouth at bedtime.  Dispense: 90 tablet; Refill: 1  4. Primary insomnia Continue with as needed use of alprazolam for insomnia, addition of Ropinirole for PLMD to also  help with insomnia [place] Controlled Substance Database was reviewed by me for overdose risk score (ORS) Reviewed risks and possible side effects associated with taking opiates, benzodiazepines and other CNS depressants. Combination of these could cause dizziness and drowsiness. Advised patient not to drive or operate machinery when taking these medications, as patient's and other's life can be at risk and will have consequences. Patient verbalized understanding in this matter. Dependence and abuse for these drugs will be monitored closely. A Controlled substance policy and procedure is on file which allows [facility] to order a urine drug screen test at any visit. Patient understands and agrees with the plan - ALPRAZolam (XANAX) 0.5 MG tablet; Take 1 tablet (0.5 mg total) by mouth at bedtime as needed for anxiety.  Dispense: 30 tablet; Refill: 1  5. Attention deficit disorder (ADD) without hyperactivity Continue working on weaning down use and strength of Adderall Will continue working on improving sleep which will improve concentration and focus [place] Controlled Substance Database was reviewed by  me for overdose risk score (ORS) - amphetamine-dextroamphetamine (ADDERALL) 10 MG tablet; Take 1 tablet (10 mg total) by mouth daily as needed.  Dispense: 30 tablet; Refill: 0 - amphetamine-dextroamphetamine (ADDERALL) 30 MG tablet; Take 1 tablet by mouth daily.  Dispense: 30 tablet; Refill: 0  6. Encounter for long-term (current) use of medications - POCT Urine Drug Screen  General Counseling: Courtlyn verbalizes understanding of the findings of todays visit and agrees with plan of treatment. I have discussed any further diagnostic evaluation that may be needed or ordered today. We also reviewed her medications today. she has been encouraged to call the office with any questions or concerns that should arise related to todays visit.    Orders Placed This Encounter  Procedures  . Ambulatory referral to Dermatology  . POCT Urine Drug Screen    Meds ordered this encounter  Medications  . rOPINIRole (REQUIP) 0.25 MG tablet    Sig: Take 1 tablet (0.25 mg total) by mouth at bedtime.    Dispense:  90 tablet    Refill:  1  . ALPRAZolam (XANAX) 0.5 MG tablet    Sig: Take 1 tablet (0.5 mg total) by mouth at bedtime as needed for anxiety.    Dispense:  30 tablet    Refill:  1  . amphetamine-dextroamphetamine (ADDERALL) 10 MG tablet    Sig: Take 1 tablet (10 mg total) by mouth daily as needed.    Dispense:  30 tablet    Refill:  0    To be taken as needed with 30 mg.  . amphetamine-dextroamphetamine (ADDERALL) 30 MG tablet    Sig: Take 1 tablet by mouth daily.    Dispense:  30 tablet    Refill:  0    Time spent: 30 Minutes Time spent includes review of chart, medications, test results and follow-up plan with the patient.  This patient was seen by Theodoro Grist AGNP-C in Collaboration with Dr Lavera Guise as a part of collaborative care agreement     Tanna Furry. Amparo Donalson AGNP-C Internal medicine

## 2020-12-22 ENCOUNTER — Encounter: Payer: Self-pay | Admitting: Hospice and Palliative Medicine

## 2020-12-23 ENCOUNTER — Telehealth: Payer: Self-pay

## 2020-12-23 NOTE — Telephone Encounter (Signed)
Faxed referral to [facility] and [place] at [facility] at [phone]. Loma Sousa

## 2020-12-27 ENCOUNTER — Other Ambulatory Visit: Payer: Self-pay | Admitting: Hospice and Palliative Medicine

## 2020-12-27 DIAGNOSIS — I10 Essential (primary) hypertension: Secondary | ICD-10-CM

## 2020-12-29 ENCOUNTER — Ambulatory Visit: Payer: BLUE CROSS/BLUE SHIELD | Admitting: Hospice and Palliative Medicine

## 2021-02-01 ENCOUNTER — Emergency Department
Admission: EM | Admit: 2021-02-01 | Discharge: 2021-02-01 | Disposition: A | Discharge status: Home / Self Care | Payer: BLUE CROSS/BLUE SHIELD | Attending: Emergency Medicine | Admitting: Emergency Medicine

## 2021-02-01 ENCOUNTER — Emergency Department: Payer: BLUE CROSS/BLUE SHIELD

## 2021-02-01 ENCOUNTER — Other Ambulatory Visit: Payer: Self-pay

## 2021-02-01 ENCOUNTER — Encounter: Payer: Self-pay | Admitting: Emergency Medicine

## 2021-02-01 DIAGNOSIS — Z5321 Procedure and treatment not carried out due to patient leaving prior to being seen by health care provider: Secondary | ICD-10-CM | POA: Insufficient documentation

## 2021-02-01 DIAGNOSIS — R072 Precordial pain: Secondary | ICD-10-CM | POA: Diagnosis present

## 2021-02-01 LAB — TROPONIN I (HIGH SENSITIVITY): Troponin I (High Sensitivity): 3 ng/L (ref <18)

## 2021-02-01 LAB — CBC
HCT: 43.4 % (ref 36.0–46.0)
Hemoglobin: 14.4 g/dL (ref 12.0–15.0)
MCH: 27.4 pg (ref 26.0–34.0)
MCHC: 33.2 g/dL (ref 30.0–36.0)
MCV: 82.5 fL (ref 80.0–100.0)
Platelets: 224 10*3/uL (ref 150–400)
RBC: 5.26 MIL/uL — ABNORMAL HIGH (ref 3.87–5.11)
RDW: 13.1 % (ref 11.5–15.5)
WBC: 6 10*3/uL (ref 4.0–10.5)
nRBC: 0 % (ref 0.0–0.2)

## 2021-02-01 LAB — BASIC METABOLIC PANEL
Anion gap: 9 (ref 5–15)
BUN: 22 mg/dL (ref 8–23)
CO2: 24 mmol/L (ref 22–32)
Calcium: 8.8 mg/dL — ABNORMAL LOW (ref 8.9–10.3)
Chloride: 105 mmol/L (ref 98–111)
Creatinine, Ser: 0.52 mg/dL (ref 0.44–1.00)
GFR, Estimated: >60 mL/min (ref >60)
Glucose, Bld: 113 mg/dL — ABNORMAL HIGH (ref 70–99)
Potassium: 4 mmol/L (ref 3.5–5.1)
Sodium: 138 mmol/L (ref 135–145)

## 2021-02-01 NOTE — ED Triage Notes (Signed)
Pt to ED via POV with c/o sudden onset "squeezing sensation around her heart". Pt c/o substernal chest tightness. Pt noted to be hyperventilating and tearful on arrival to triage room. Pt encouraged to slow breathing down.

## 2021-02-23 ENCOUNTER — Ambulatory Visit: Payer: BLUE CROSS/BLUE SHIELD | Admitting: Hospice and Palliative Medicine

## 2021-02-24 ENCOUNTER — Ambulatory Visit (INDEPENDENT_AMBULATORY_CARE_PROVIDER_SITE_OTHER): Payer: BLUE CROSS/BLUE SHIELD | Admitting: Nurse Practitioner

## 2021-02-24 ENCOUNTER — Encounter: Payer: Self-pay | Admitting: Nurse Practitioner

## 2021-02-24 ENCOUNTER — Other Ambulatory Visit: Payer: Self-pay

## 2021-02-24 VITALS — BP 140/90 | HR 110 | Temp 97.9°F | Resp 16 | Ht 58.5 in | Wt 188.2 lb

## 2021-02-24 DIAGNOSIS — F988 Other specified behavioral and emotional disorders with onset usually occurring in childhood and adolescence: Secondary | ICD-10-CM | POA: Diagnosis not present

## 2021-02-24 DIAGNOSIS — R000 Tachycardia, unspecified: Secondary | ICD-10-CM | POA: Diagnosis not present

## 2021-02-24 DIAGNOSIS — I10 Essential (primary) hypertension: Secondary | ICD-10-CM | POA: Diagnosis not present

## 2021-02-24 DIAGNOSIS — F5101 Primary insomnia: Secondary | ICD-10-CM

## 2021-02-24 DIAGNOSIS — R9389 Abnormal findings on diagnostic imaging of other specified body structures: Secondary | ICD-10-CM

## 2021-02-24 MED ORDER — ALPRAZOLAM 0.5 MG PO TABS: 0.5000 mg | ORAL_TABLET | Freq: Every evening | ORAL | 1 refills | Status: DC | PRN

## 2021-02-24 MED ORDER — AMPHETAMINE-DEXTROAMPHETAMINE 10 MG PO TABS: 10.0000 mg | ORAL_TABLET | Freq: Every day | ORAL | 0 refills | Status: DC | PRN

## 2021-02-24 MED ORDER — QUVIVIQ 25 MG PO TABS: 25.0000 mg | ORAL_TABLET | Freq: Every evening | ORAL | 3 refills | Status: DC | PRN

## 2021-02-24 MED ORDER — QUVIVIQ 25 MG PO TABS: 25.0000 mg | ORAL_TABLET | Freq: Every evening | ORAL | 2 refills | Status: DC | PRN

## 2021-02-24 MED ORDER — AMPHETAMINE-DEXTROAMPHETAMINE 30 MG PO TABS: 30.0000 mg | ORAL_TABLET | Freq: Every day | ORAL | 0 refills | Status: DC

## 2021-02-24 NOTE — Progress Notes (Signed)
[facility] [place], [place] [address]  Internal MEDICINE  Office Visit Note  Patient Name: Megan [name]  [id number]  [id number]  Date of Service: 03/06/2021  Chief Complaint  Patient presents with   Follow-up    Discuss chest xray, difficulty staying asleep   Quality Metric Gaps    Pneumovax    HPI Megan [name] presents for a follow up visit ADHD medication refill, trouble sleeping and to discuss chest xray results.  She has a history of ADD and elevated blood pressure with no diagnosis of hypertension. When she was in the ED recently a chest xray was done and it showed a nodular opacity that was said to correspond with the cardiac monitor sticker that was on her chest at the time. It was recommended to repeat the chest xray once the cardiac monitor sticker was removed to rule out a possible lung nodule.  She reports having tried many medications to help her fall and stay asleep. She has heard of Quviviq and is interested in trying it. She also wears an Aura ring to help track her sleep.    Current Medication: Outpatient Encounter Medications as of 02/24/2021  Medication Sig   amLODipine (NORVASC) 10 MG tablet Take 1 tablet by mouth daily.   Daridorexant HCl (QUVIVIQ) 50 MG TABS Take 50 mg by mouth at bedtime.   valACYclovir (VALTREX) 500 MG tablet TAKE 1 TABLET BY MOUTH TWICE A DAY   [DISCONTINUED] ALPRAZolam (XANAX) 0.5 MG tablet Take 1 tablet (0.5 mg total) by mouth at bedtime as needed for anxiety.   [DISCONTINUED] amphetamine-dextroamphetamine (ADDERALL) 10 MG tablet Take 1 tablet (10 mg total) by mouth daily as needed.   [DISCONTINUED] amphetamine-dextroamphetamine (ADDERALL) 30 MG tablet Take 1 tablet by mouth daily.   [DISCONTINUED] Daridorexant HCl (QUVIVIQ) 25 MG TABS Take 25 mg by mouth at bedtime as needed (insomnia).   [DISCONTINUED] meloxicam (MOBIC) 7.5 MG tablet Take 7.5 mg by mouth 2 (two) times daily.   ALPRAZolam (XANAX) 0.5 MG tablet  Take 1 tablet (0.5 mg total) by mouth at bedtime as needed for anxiety.   amphetamine-dextroamphetamine (ADDERALL) 10 MG tablet Take 1 tablet (10 mg total) by mouth daily as needed.   amphetamine-dextroamphetamine (ADDERALL) 30 MG tablet Take 1 tablet by mouth daily.   [DISCONTINUED] bisoprolol (ZEBETA) 5 MG tablet TAKE 1 TABLET BY MOUTH EVERY DAY (Patient not taking: Reported on 02/24/2021)   [DISCONTINUED] Daridorexant HCl (QUVIVIQ) 25 MG TABS Take 25 mg by mouth at bedtime as needed (insomnia).   [DISCONTINUED] hydrochlorothiazide (HYDRODIURIL) 12.5 MG tablet TAKE 1 TABLET BY MOUTH EVERY DAY (Patient not taking: Reported on 02/24/2021)   [DISCONTINUED] rOPINIRole (REQUIP) 0.25 MG tablet Take 1 tablet (0.25 mg total) by mouth at bedtime. (Patient not taking: Reported on 02/24/2021)   No facility-administered encounter medications on file as of 02/24/2021.    Surgical History: Past Surgical History:  Procedure Laterality Date   BREAST BIOPSY Left 2002   benign   BUNIONECTOMY Right    CESAREAN SECTION      Medical History: Past Medical History:  Diagnosis Date   ADD (attention deficit disorder)    Pre-hypertension     Family History: Family History  Problem Relation Age of Onset   Lung cancer Father    Diabetes Sister    Breast cancer Neg Hx     Social History   Socioeconomic History   Marital status: Married    Spouse name: Not on file   Number of  children: Not on file   Years of education: Not on file   Highest education level: Not on file  Occupational History   Not on file  Tobacco Use   Smoking status: Never   Smokeless tobacco: Never  Vaping Use   Vaping Use: Never used  Substance and Sexual Activity   Alcohol use: Yes    Comment: social   Drug use: Not Currently    Types: Marijuana    Comment: once in a while    Sexual activity: Not on file  Other Topics Concern   Not on file  Social History Narrative   Not on file   Social Determinants of Health    Financial Resource Strain: Not on file  Food Insecurity: Not on file  Transportation Needs: Not on file  Physical Activity: Not on file  Stress: Not on file  Social Connections: Not on file  Intimate Partner Violence: Not on file      Review of Systems  Constitutional:  Negative for chills, fatigue and unexpected weight change.  HENT:  Negative for congestion, rhinorrhea, sneezing and sore throat.   Eyes:  Negative for redness.  Respiratory:  Negative for cough, chest tightness and shortness of breath.   Cardiovascular:  Negative for chest pain and palpitations.  Gastrointestinal:  Negative for abdominal pain, constipation, diarrhea, nausea and vomiting.  Genitourinary:  Negative for dysuria and frequency.  Musculoskeletal:  Negative for arthralgias, back pain, joint swelling and neck pain.  Skin:  Negative for rash.  Neurological: Negative.  Negative for tremors and numbness.  Hematological:  Negative for adenopathy. Does not bruise/bleed easily.  Psychiatric/Behavioral:  Negative for behavioral problems (Depression), sleep disturbance and suicidal ideas. The patient is not nervous/anxious.    Vital Signs: BP 140/90   Pulse (!) 110   Temp 97.9 F (36.6 C)   Resp 16   Ht 4' 10.5" (1.486 m)   Wt 188 lb 3.2 oz (85.4 kg)   SpO2 98%   BMI 38.66 kg/m    Physical Exam Vitals reviewed.  Constitutional:      General: She is not in acute distress.    Appearance: Normal appearance. She is obese. She is not ill-appearing.  HENT:     Head: Normocephalic and atraumatic.  Cardiovascular:     Rate and Rhythm: Normal rate and regular rhythm.     Pulses: Normal pulses.     Heart sounds: Normal heart sounds.  Pulmonary:     Effort: Pulmonary effort is normal.     Breath sounds: Normal breath sounds.  Skin:    General: Skin is warm and dry.     Capillary Refill: Capillary refill takes less than 2 seconds.  Neurological:     Mental Status: She is alert and oriented to person,  place, and time.  Psychiatric:        Mood and Affect: Mood normal.        Behavior: Behavior normal.    Assessment/Plan: 1. Abnormal chest x-ray She had an abnormal chest xray in the ED recently where a possible lung nodule was found but could likely have been the xray picking up the cardiac monitor sticker on the patient's chest. A repeat chest xray is recommended. Xray ordered.  - DG Chest 2 View; Future  2. Tachycardia Heart rate is 110. Tachycardia is a side effect of stimulant medications and she is taking 30-40 mg daily. The patient expressed interested in decreasing her adderall dose once she gets her insomnia under control.  Patient verbalizes awareness of risks associated with stimulant medications and elevated heart rate.   3. Attention deficit disorder (ADD) without hyperactivity Adderall 30 mg and adderall 10 mg refilled for 1 month. Will most likely drop the extra 10 mg in 1 month. Will reevaluate and discuss with patient at next follow up visit in 4 weeks.  - amphetamine-dextroamphetamine (ADDERALL) 10 MG tablet; Take 1 tablet (10 mg total) by mouth daily as needed.  Dispense: 30 tablet; Refill: 0 - amphetamine-dextroamphetamine (ADDERALL) 30 MG tablet; Take 1 tablet by mouth daily.  Dispense: 30 tablet; Refill: 0  4. Essential hypertension Patient has a history of hypertension, amlodipine refilled.  - amLODipine (NORVASC) 10 MG tablet; Take 1 tablet by mouth daily.  5. Primary insomnia Patient has been taking alprazolam to help her sleep. She is interested in trying Quviviq for her insomnia. This medication was ordered and sent to the [facility] prescription services. Follow up in 4 weeks.  - ALPRAZolam (XANAX) 0.5 MG tablet; Take 1 tablet (0.5 mg total) by mouth at bedtime as needed for anxiety.  Dispense: 30 tablet; Refill: 1 - Daridorexant HCl (QUVIVIQ) 50 MG TABS; Take 50 mg by mouth at bedtime.  Dispense: 30 tablet; Refill: 3   General Counseling: Srah verbalizes  understanding of the findings of todays visit and agrees with plan of treatment. I have discussed any further diagnostic evaluation that may be needed or ordered today. We also reviewed her medications today. she has been encouraged to call the office with any questions or concerns that should arise related to todays visit.    Orders Placed This Encounter  Procedures   DG Chest 2 View     Meds ordered this encounter  Medications   DISCONTD: Daridorexant HCl (QUVIVIQ) 25 MG TABS    Sig: Take 25 mg by mouth at bedtime as needed (insomnia).    Dispense:  30 tablet    Refill:  2   ALPRAZolam (XANAX) 0.5 MG tablet    Sig: Take 1 tablet (0.5 mg total) by mouth at bedtime as needed for anxiety.    Dispense:  30 tablet    Refill:  1   amphetamine-dextroamphetamine (ADDERALL) 10 MG tablet    Sig: Take 1 tablet (10 mg total) by mouth daily as needed.    Dispense:  30 tablet    Refill:  0    To be taken as needed with 30 mg.   amphetamine-dextroamphetamine (ADDERALL) 30 MG tablet    Sig: Take 1 tablet by mouth daily.    Dispense:  30 tablet    Refill:  0   DISCONTD: Daridorexant HCl (QUVIVIQ) 25 MG TABS    Sig: Take 25 mg by mouth at bedtime as needed (insomnia).    Dispense:  30 tablet    Refill:  3   Daridorexant HCl (QUVIVIQ) 50 MG TABS    Sig: Take 50 mg by mouth at bedtime.    Dispense:  30 tablet    Refill:  3    Return in about 4 weeks (around 03/24/2021) for F/U insomnia , Weltha Cathy PCP.   Total time spent:30 Minutes Time spent includes review of chart, medications, test results, and follow up plan with the patient.   [place] Controlled Substance Database was reviewed by me.  This patient was seen by Jonetta Osgood, FNP-C in collaboration with Dr. Clayborn Bigness as a part of collaborative care agreement.   Takila Kronberg R. Valetta Fuller, MSN, FNP-C Internal medicine

## 2021-02-25 MED ORDER — QUVIVIQ 50 MG PO TABS: 50.0000 mg | ORAL_TABLET | Freq: Every day | ORAL | 3 refills | Status: DC

## 2021-03-10 ENCOUNTER — Telehealth: Payer: Self-pay

## 2021-03-10 NOTE — Telephone Encounter (Signed)
-----   Message from Jonetta Osgood, NP sent at 03/06/2021  8:51 AM EDT ----- Regarding: chest xray Hi Sheena!  This patient had an abnormal chest xray in the ED at the end of may. It showed a nodule but said it was likely the cardiac monitor sticker and to repeat the chest xray to rule out a nodule. I have ordered the xray to be done at [facility] on [address]. Near [facility]. Please call the patient and let her know I need her to go there for the xray. No appointment is needed, xrays are walk-ins.   Thank you,  Alyssa

## 2021-03-10 NOTE — Telephone Encounter (Signed)
PA sent for [facility] waiting for response

## 2021-03-18 ENCOUNTER — Telehealth: Payer: Self-pay

## 2021-03-18 NOTE — Telephone Encounter (Signed)
pt's PA for Quviviq 50 mg was denied due to the pt  needs to try other alturnatives such as Eszopiclone, Zaleplon, etc per pt's insurance

## 2021-03-22 ENCOUNTER — Encounter: Payer: Self-pay | Admitting: Nurse Practitioner

## 2021-04-02 ENCOUNTER — Other Ambulatory Visit: Payer: Self-pay

## 2021-04-02 ENCOUNTER — Encounter: Payer: Self-pay | Admitting: Physician Assistant

## 2021-04-02 ENCOUNTER — Ambulatory Visit: Payer: BLUE CROSS/BLUE SHIELD | Admitting: Physician Assistant

## 2021-04-02 DIAGNOSIS — R9389 Abnormal findings on diagnostic imaging of other specified body structures: Secondary | ICD-10-CM

## 2021-04-02 DIAGNOSIS — F988 Other specified behavioral and emotional disorders with onset usually occurring in childhood and adolescence: Secondary | ICD-10-CM

## 2021-04-02 DIAGNOSIS — F5101 Primary insomnia: Secondary | ICD-10-CM | POA: Diagnosis not present

## 2021-04-02 DIAGNOSIS — K224 Dyskinesia of esophagus: Secondary | ICD-10-CM | POA: Diagnosis not present

## 2021-04-02 DIAGNOSIS — I10 Essential (primary) hypertension: Secondary | ICD-10-CM

## 2021-04-02 MED ORDER — AMPHETAMINE-DEXTROAMPHETAMINE 10 MG PO TABS: 10.0000 mg | ORAL_TABLET | Freq: Every day | ORAL | 0 refills | Status: DC | PRN

## 2021-04-02 MED ORDER — AMPHETAMINE-DEXTROAMPHETAMINE 30 MG PO TABS: 30.0000 mg | ORAL_TABLET | Freq: Every day | ORAL | 0 refills | Status: DC

## 2021-04-02 NOTE — Progress Notes (Signed)
[facility] [place], [place] [address]  Internal MEDICINE  Office Visit Note  Patient Name: Megan [name]  [id number]  [id number]  Date of Service: 04/11/2021  Chief Complaint  Patient presents with   Follow-up    Insomnia, bp is high due to patient has not taken bp med in 5 day and just started back    HPI Pt is here for routine follow up -Has been having GI problems with esopgeal spasms with food getting stuck and then causes pain across epigastric region and then it resolves. Not really having acid reflux unless she is drinking wine and has been limiting intake. Will take pepcid as needed. Will order upper GI to evaluate -Also mentions she was supposed to repeat chest xray due to abnormal finding on last CXR--believed to be a cardiac sticker as cause of finding, but recheck warranted--needs order sent to [facility] -Since starting Quviviq she falls asleep quicker, but doesn't necessarily sleep better. Has an anoro ring to track sleep. Working on sleep hygiene. Working to cut out late night eating now. Wants to continue on medication a little longer to see if ability to stay asleep improves over time with use and lifestyle changes. -BP improved to 140/90 on recheck, did not sleep well last night and just started back on bp med after she had some swelling on vacation--thought there was a chance swelling was due to medication but now thinks it was just the heat and food she was eating. -Requests refill of her adderall today and understands her BP must be controlled to continue  Current Medication: Outpatient Encounter Medications as of 04/02/2021  Medication Sig   ALPRAZolam (XANAX) 0.5 MG tablet Take 1 tablet (0.5 mg total) by mouth at bedtime as needed for anxiety.   amLODipine (NORVASC) 10 MG tablet Take 1 tablet by mouth daily.   Daridorexant HCl (QUVIVIQ) 50 MG TABS Take 50 mg by mouth at bedtime.   valACYclovir (VALTREX) 500 MG tablet TAKE 1 TABLET BY MOUTH  TWICE A DAY   [DISCONTINUED] amphetamine-dextroamphetamine (ADDERALL) 10 MG tablet Take 1 tablet (10 mg total) by mouth daily as needed.   [DISCONTINUED] amphetamine-dextroamphetamine (ADDERALL) 30 MG tablet Take 1 tablet by mouth daily.   amphetamine-dextroamphetamine (ADDERALL) 10 MG tablet Take 1 tablet (10 mg total) by mouth daily as needed.   amphetamine-dextroamphetamine (ADDERALL) 30 MG tablet Take 1 tablet by mouth daily.   No facility-administered encounter medications on file as of 04/02/2021.    Surgical History: Past Surgical History:  Procedure Laterality Date   BREAST BIOPSY Left 2002   benign   BUNIONECTOMY Right    CESAREAN SECTION      Medical History: Past Medical History:  Diagnosis Date   ADD (attention deficit disorder)    Pre-hypertension     Family History: Family History  Problem Relation Age of Onset   Lung cancer Father    Diabetes Sister    Breast cancer Neg Hx     Social History   Socioeconomic History   Marital status: Married    Spouse name: Not on file   Number of children: Not on file   Years of education: Not on file   Highest education level: Not on file  Occupational History   Not on file  Tobacco Use   Smoking status: Never   Smokeless tobacco: Never  Vaping Use   Vaping Use: Never used  Substance and Sexual Activity   Alcohol use: Yes    Comment:  social   Drug use: Not Currently    Types: Marijuana    Comment: once in a while    Sexual activity: Not on file  Other Topics Concern   Not on file  Social History Narrative   Not on file   Social Determinants of Health   Financial Resource Strain: Not on file  Food Insecurity: Not on file  Transportation Needs: Not on file  Physical Activity: Not on file  Stress: Not on file  Social Connections: Not on file  Intimate Partner Violence: Not on file      Review of Systems  Constitutional:  Negative for chills, fatigue and unexpected weight change.  HENT:  Negative  for congestion, postnasal drip, rhinorrhea, sneezing and sore throat.   Eyes:  Negative for redness.  Respiratory:  Negative for cough, chest tightness and shortness of breath.   Cardiovascular:  Negative for chest pain and palpitations.  Gastrointestinal:  Negative for abdominal pain, constipation, diarrhea, nausea and vomiting.       Reflux  Genitourinary:  Negative for dysuria and frequency.  Musculoskeletal:  Negative for arthralgias, back pain, joint swelling and neck pain.  Skin:  Negative for rash.  Neurological: Negative.  Negative for tremors and numbness.  Hematological:  Negative for adenopathy. Does not bruise/bleed easily.  Psychiatric/Behavioral:  Positive for decreased concentration and sleep disturbance. Negative for behavioral problems (Depression) and suicidal ideas. The patient is not nervous/anxious.    Vital Signs: BP (!) 142/95   Pulse 98   Temp 98.6 F (37 C)   Resp 16   Ht '4\' 10"'$  (1.473 m)   Wt 186 lb 9.6 oz (84.6 kg)   SpO2 95%   BMI 39.00 kg/m    Physical Exam Vitals and nursing note reviewed.  Constitutional:      General: She is not in acute distress.    Appearance: She is well-developed. She is not diaphoretic.  HENT:     Head: Normocephalic and atraumatic.     Mouth/Throat:     Pharynx: No oropharyngeal exudate.  Eyes:     Pupils: Pupils are equal, round, and reactive to light.  Neck:     Thyroid: No thyromegaly.     Vascular: No JVD.     Trachea: No tracheal deviation.  Cardiovascular:     Rate and Rhythm: Normal rate and regular rhythm.     Heart sounds: Normal heart sounds. No murmur heard.   No friction rub. No gallop.  Pulmonary:     Effort: Pulmonary effort is normal. No respiratory distress.     Breath sounds: No wheezing or rales.  Chest:     Chest wall: No tenderness.  Abdominal:     General: Bowel sounds are normal.     Palpations: Abdomen is soft.  Musculoskeletal:        General: Normal range of motion.     Cervical  back: Normal range of motion and neck supple.  Lymphadenopathy:     Cervical: No cervical adenopathy.  Skin:    General: Skin is warm and dry.  Neurological:     Mental Status: She is alert and oriented to person, place, and time.     Cranial Nerves: No cranial nerve deficit.  Psychiatric:        Behavior: Behavior normal.        Thought Content: Thought content normal.        Judgment: Judgment normal.       Assessment/Plan: 1. Essential hypertension Mildly elevated  in office--patient has been without medication a few days and understands the importance of taking it daily and monitoring closely  2. Attention deficit disorder (ADD) without hyperactivity Refill sent. Understands BP must be controlled. - amphetamine-dextroamphetamine (ADDERALL) 30 MG tablet; Take 1 tablet by mouth daily.  Dispense: 30 tablet; Refill: 0 - amphetamine-dextroamphetamine (ADDERALL) 10 MG tablet; Take 1 tablet (10 mg total) by mouth daily as needed.  Dispense: 30 tablet; Refill: 0 [place] Controlled Substance Database was reviewed by me for overdose risk score (ORS) Refilled Controlled medications today. Reviewed risks and possible side effects associated with taking Stimulants. Combination of these drugs with other psychotropic medications could cause dizziness and drowsiness. Pt needs to Monitor symptoms and exercise caution in driving and operating heavy machinery to avoid damages to oneself, to others and to the surroundings. Patient verbalized understanding in this matter. Dependence and abuse for these drugs will be monitored closely. A Controlled substance policy and procedure is on file which allows [facility] to order a urine drug screen test at any visit. Patient understands and agrees with the plan..  3. Primary insomnia Continue Quviviq and sleep hygiene   4. Esophageal spasm Will order upper GI for further evaluation - DG UGI W SINGLE CM (SOL OR THIN BA); Future  5. Abnormal chest  x-ray - DG Chest 2 View; Future   General Counseling: Megan [name] understanding of the findings of todays visit and agrees with plan of treatment. I have discussed any further diagnostic evaluation that may be needed or ordered today. We also reviewed her medications today. she has been encouraged to call the office with any questions or concerns that should arise related to todays visit.    Orders Placed This Encounter  Procedures   DG Chest 2 View   DG UGI W SINGLE CM (SOL OR THIN BA)    Meds ordered this encounter  Medications   amphetamine-dextroamphetamine (ADDERALL) 30 MG tablet    Sig: Take 1 tablet by mouth daily.    Dispense:  30 tablet    Refill:  0   amphetamine-dextroamphetamine (ADDERALL) 10 MG tablet    Sig: Take 1 tablet (10 mg total) by mouth daily as needed.    Dispense:  30 tablet    Refill:  0    To be taken as needed with 30 mg.    This patient was seen by Drema Dallas, PA-C in collaboration with Dr. Clayborn Bigness as a part of collaborative care agreement.   Total time spent:35 Minutes Time spent includes review of chart, medications, test results, and follow up plan with the patient.      Dr Lavera Guise Internal medicine

## 2021-04-07 ENCOUNTER — Telehealth: Payer: Self-pay

## 2021-04-07 NOTE — Telephone Encounter (Signed)
Cxr and UGI orders faxed to [facility] at [phone] and at [phone].

## 2021-04-07 NOTE — Telephone Encounter (Signed)
Cxr and UGI faxed to [facility] at [phone] and at [phone].

## 2021-04-13 ENCOUNTER — Other Ambulatory Visit: Payer: Self-pay

## 2021-04-13 ENCOUNTER — Ambulatory Visit: Payer: BLUE CROSS/BLUE SHIELD | Admitting: Nurse Practitioner

## 2021-04-13 ENCOUNTER — Other Ambulatory Visit: Payer: Self-pay | Admitting: Nurse Practitioner

## 2021-04-13 ENCOUNTER — Encounter: Payer: Self-pay | Admitting: Nurse Practitioner

## 2021-04-13 VITALS — BP 128/90 | HR 114 | Temp 97.4°F | Resp 16 | Ht 58.5 in | Wt 184.4 lb

## 2021-04-13 DIAGNOSIS — R000 Tachycardia, unspecified: Secondary | ICD-10-CM

## 2021-04-13 DIAGNOSIS — Z23 Encounter for immunization: Secondary | ICD-10-CM | POA: Diagnosis not present

## 2021-04-13 DIAGNOSIS — Z6839 Body mass index (BMI) 39.0-39.9, adult: Secondary | ICD-10-CM

## 2021-04-13 DIAGNOSIS — E6609 Other obesity due to excess calories: Secondary | ICD-10-CM

## 2021-04-13 DIAGNOSIS — R7301 Impaired fasting glucose: Secondary | ICD-10-CM | POA: Diagnosis not present

## 2021-04-13 DIAGNOSIS — F988 Other specified behavioral and emotional disorders with onset usually occurring in childhood and adolescence: Secondary | ICD-10-CM

## 2021-04-13 LAB — POCT GLYCOSYLATED HEMOGLOBIN (HGB A1C): Hemoglobin A1C: 5.3 % (ref 4.0–5.6)

## 2021-04-13 MED ORDER — RYBELSUS 3 MG PO TABS: 3.0000 mg | ORAL_TABLET | Freq: Every day | ORAL | 1 refills | Status: DC

## 2021-04-13 MED ORDER — PNEUMOCOCCAL 20-VAL CONJ VACC 0.5 ML IM SUSY: 0.5000 mL | PREFILLED_SYRINGE | INTRAMUSCULAR | 0 refills | Status: AC

## 2021-04-13 NOTE — Progress Notes (Addendum)
[facility] [place], [place] [address]  Internal MEDICINE  Office Visit Note  Patient Name: Megan [name]  [id number]  [id number]  Date of Service: 04/13/2021  Chief Complaint  Patient presents with   Follow-up    Weight loss, discuss pre diabetes    HPI [name] presents for a follow up visit for weight loss and insomnia. She has lost 2 lbs since her last office visit. She has been taking Quviviq for 4 weeks now for insomnia and has been sleep better. She reports that her sleep is still not where she wants it to be but she is able to sleep for more than 4 hours without waking up. She reports that the medication works better when she takes her alprazolam at bedtime too.    Current Medication: Outpatient Encounter Medications as of 04/13/2021  Medication Sig   ALPRAZolam (XANAX) 0.5 MG tablet Take 1 tablet (0.5 mg total) by mouth at bedtime as needed for anxiety.   amLODipine (NORVASC) 10 MG tablet Take 1 tablet by mouth daily.   amphetamine-dextroamphetamine (ADDERALL) 10 MG tablet Take 1 tablet (10 mg total) by mouth daily as needed.   amphetamine-dextroamphetamine (ADDERALL) 30 MG tablet Take 1 tablet by mouth daily.   Daridorexant HCl (QUVIVIQ) 50 MG TABS Take 50 mg by mouth at bedtime.   Semaglutide (RYBELSUS) 3 MG TABS Take 3 mg by mouth daily before breakfast. Please take on an empty stomach with no more than 4 oz of water and wait 30 minutes before eating, drinking or taking any other medications.   valACYclovir (VALTREX) 500 MG tablet TAKE 1 TABLET BY MOUTH TWICE A DAY   [DISCONTINUED] pneumococcal 20-Val Conj Vacc (PREVNAR 20) 0.5 ML injection Inject 0.5 mLs into the muscle tomorrow at 10 am.   [EXPIRED] pneumococcal 20-Val Conj Vacc (PREVNAR 20) 0.5 ML injection Inject 0.5 mLs into the muscle tomorrow at 10 am for 1 dose.   No facility-administered encounter medications on file as of 04/13/2021.    Surgical History: Past Surgical History:   Procedure Laterality Date   BREAST BIOPSY Left 2002   benign   BUNIONECTOMY Right    CESAREAN SECTION      Medical History: Past Medical History:  Diagnosis Date   ADD (attention deficit disorder)    Pre-hypertension     Family History: Family History  Problem Relation Age of Onset   Lung cancer Father    Diabetes Sister    Breast cancer Neg Hx     Social History   Socioeconomic History   Marital status: Married    Spouse name: Not on file   Number of children: Not on file   Years of education: Not on file   Highest education level: Not on file  Occupational History   Not on file  Tobacco Use   Smoking status: Never   Smokeless tobacco: Never  Vaping Use   Vaping Use: Never used  Substance and Sexual Activity   Alcohol use: Yes    Comment: social   Drug use: Not Currently    Types: Marijuana    Comment: once in a while    Sexual activity: Not on file  Other Topics Concern   Not on file  Social History Narrative   Not on file   Social Determinants of Health   Financial Resource Strain: Not on file  Food Insecurity: Not on file  Transportation Needs: Not on file  Physical Activity: Not on file  Stress: Not  on file  Social Connections: Not on file  Intimate Partner Violence: Not on file      Review of Systems  Constitutional:  Negative for chills, fatigue and unexpected weight change.  HENT:  Negative for congestion, rhinorrhea, sneezing and sore throat.   Eyes:  Negative for redness.  Respiratory:  Negative for cough, chest tightness and shortness of breath.   Cardiovascular:  Negative for chest pain and palpitations.  Gastrointestinal:  Negative for abdominal pain, constipation, diarrhea, nausea and vomiting.  Genitourinary:  Negative for dysuria and frequency.  Musculoskeletal:  Negative for arthralgias, back pain, joint swelling and neck pain.  Skin:  Negative for rash.  Neurological: Negative.  Negative for tremors and numbness.   Hematological:  Negative for adenopathy. Does not bruise/bleed easily.  Psychiatric/Behavioral:  Negative for behavioral problems (Depression), sleep disturbance and suicidal ideas. The patient is not nervous/anxious.    Vital Signs: BP 128/90   Pulse (!) 114   Temp (!) 97.4 F (36.3 C)   Resp 16   Ht 4' 10.5" (1.486 m)   Wt 184 lb 6.4 oz (83.6 kg)   SpO2 97%   BMI 37.88 kg/m    Physical Exam Vitals reviewed.  Constitutional:      General: She is not in acute distress.    Appearance: Normal appearance. She is not ill-appearing.  HENT:     Head: Normocephalic and atraumatic.  Cardiovascular:     Rate and Rhythm: Normal rate and regular rhythm.  Pulmonary:     Effort: Pulmonary effort is normal. No respiratory distress.  Skin:    General: Skin is warm and dry.     Capillary Refill: Capillary refill takes less than 2 seconds.  Neurological:     Mental Status: She is alert and oriented to person, place, and time.  Psychiatric:        Mood and Affect: Mood normal.        Behavior: Behavior normal.    Assessment/Plan: 1. Class 2 obesity due to excess calories without serious comorbidity with body mass index (BMI) of 39.0 to 39.9 in adult Taking rybelsus, refill ordered - Semaglutide (RYBELSUS) 3 MG TABS; Take 3 mg by mouth daily before breakfast. Please take on an empty stomach with no more than 4 oz of water and wait 30 minutes before eating, drinking or taking any other medications.  Dispense: 30 tablet; Refill: 1  2. Attention deficit disorder (ADD) without hyperactivity Stable with current medication and dose.   3. Impaired fasting glucose A1C is 5.3. Rybelsus prescribed for weight loss and impaired fasting glcucose. - POCT glycosylated hemoglobin (Hb A1C) - Semaglutide (RYBELSUS) 3 MG TABS; Take 3 mg by mouth daily before breakfast. Please take on an empty stomach with no more than 4 oz of water and wait 30 minutes before eating, drinking or taking any other  medications.  Dispense: 30 tablet; Refill: 1  4. Need for pneumococcal vaccination Order sent to pharmacy.  - pneumococcal 20-Val Conj Vacc (PREVNAR 20) 0.5 ML injection; Inject 0.5 mLs into the muscle tomorrow at 10 am for 1 dose.  Dispense: 0.5 mL; Refill: 0  am for 1 dose.  Dispense: 0.5 mL; Refill: 0  5. Tachycardia Pt is intially tachycardiac, repeat heart rate is around 99-105   General Counseling: Lin Givens understanding of the findings of todays visit and agrees with plan of treatment. I have discussed any further diagnostic evaluation that may be needed or ordered today. We also reviewed her medications today. she  has been encouraged to call the office with any questions or concerns that should arise related to todays visit.    Orders Placed This Encounter  Procedures   POCT glycosylated hemoglobin (Hb A1C)    Meds ordered this encounter  Medications   pneumococcal 20-Val Conj Vacc (PREVNAR 20) 0.5 ML injection    Sig: Inject 0.5 mLs into the muscle tomorrow at 10 am for 1 dose.    Dispense:  0.5 mL    Refill:  0   Semaglutide (RYBELSUS) 3 MG TABS    Sig: Take 3 mg by mouth daily before breakfast. Please take on an empty stomach with no more than 4 oz of water and wait 30 minutes before eating, drinking or taking any other medications.    Dispense:  30 tablet    Refill:  1    Return in 22 days (on 05/05/2021) for previously scheduled, Weight loss, ADHD med check.   Total time spent:30 Minutes Time spent includes review of chart, medications, test results, and follow up plan with the patient.   [place] Controlled Substance Database was reviewed by me.  This patient was seen by Jonetta Osgood, FNP-C in collaboration with Dr. Clayborn Bigness as a part of collaborative care agreement.   Fahim Kats R. Valetta Fuller, MSN, FNP-C Internal medicine

## 2021-04-26 NOTE — Addendum Note (Signed)
Addended by: Lavera Guise on: 04/26/2021 09:48 AM   Modules accepted: Level of Service

## 2021-05-05 ENCOUNTER — Ambulatory Visit: Payer: BLUE CROSS/BLUE SHIELD | Admitting: Nurse Practitioner

## 2021-05-05 ENCOUNTER — Other Ambulatory Visit: Payer: Self-pay

## 2021-05-05 ENCOUNTER — Encounter: Payer: Self-pay | Admitting: Nurse Practitioner

## 2021-05-05 VITALS — BP 130/90 | HR 85 | Temp 97.7°F | Resp 16 | Ht 58.5 in | Wt 186.0 lb

## 2021-05-05 DIAGNOSIS — Z79899 Other long term (current) drug therapy: Secondary | ICD-10-CM | POA: Diagnosis not present

## 2021-05-05 DIAGNOSIS — F5101 Primary insomnia: Secondary | ICD-10-CM | POA: Diagnosis not present

## 2021-05-05 DIAGNOSIS — F988 Other specified behavioral and emotional disorders with onset usually occurring in childhood and adolescence: Secondary | ICD-10-CM

## 2021-05-05 DIAGNOSIS — F411 Generalized anxiety disorder: Secondary | ICD-10-CM | POA: Diagnosis not present

## 2021-05-05 DIAGNOSIS — Z23 Encounter for immunization: Secondary | ICD-10-CM

## 2021-05-05 DIAGNOSIS — E6609 Other obesity due to excess calories: Secondary | ICD-10-CM

## 2021-05-05 DIAGNOSIS — Z6839 Body mass index (BMI) 39.0-39.9, adult: Secondary | ICD-10-CM

## 2021-05-05 LAB — POCT URINE DRUG SCREEN
Methylenedioxyamphetamine: NOT DETECTED
POC Amphetamine UR: POSITIVE — AB
POC BENZODIAZEPINES UR: POSITIVE — AB
POC Barbiturate UR: NOT DETECTED
POC Cocaine UR: NOT DETECTED
POC Ecstasy UR: NOT DETECTED
POC Marijuana UR: NOT DETECTED
POC Methadone UR: NOT DETECTED
POC Methamphetamine UR: NOT DETECTED
POC Opiate Ur: NOT DETECTED
POC Oxycodone UR: NOT DETECTED
POC PHENCYCLIDINE UR: NOT DETECTED
POC TRICYCLICS UR: NOT DETECTED

## 2021-05-05 MED ORDER — PNEUMOCOCCAL 20-VAL CONJ VACC 0.5 ML IM SUSY: 0.5000 mL | PREFILLED_SYRINGE | INTRAMUSCULAR | 0 refills | Status: AC

## 2021-05-05 MED ORDER — AMPHETAMINE-DEXTROAMPHETAMINE 30 MG PO TABS: 30.0000 mg | ORAL_TABLET | Freq: Every day | ORAL | 0 refills | Status: DC

## 2021-05-05 MED ORDER — AMPHETAMINE-DEXTROAMPHETAMINE 30 MG PO TABS
30.0000 mg | ORAL_TABLET | Freq: Every day | ORAL | 0 refills | Status: DC
Start: 2021-06-02 — End: 2021-09-22

## 2021-05-05 MED ORDER — ALPRAZOLAM 0.5 MG PO TABS: 0.5000 mg | ORAL_TABLET | Freq: Every evening | ORAL | 1 refills | Status: DC | PRN

## 2021-05-05 MED ORDER — AMPHETAMINE-DEXTROAMPHETAMINE 10 MG PO TABS: 10.0000 mg | ORAL_TABLET | Freq: Every day | ORAL | 0 refills | Status: DC | PRN

## 2021-05-05 NOTE — Progress Notes (Signed)
[facility], [place] [address]  Internal MEDICINE  Office Visit Note  Patient Name: Megan [name]  [id number]  [id number]  Date of Service: 05/05/2021  Chief Complaint  Patient presents with   Follow-up    Refills     HPI Megan [name] presents for a follow up visit for medication refills. She take adderall for ADHD and she take alprazolam as needed for anxiety.  She is upset as she recently found out that her current PCP and clinic are no longer in-network with her current insurance. She was tearful and discussed possibly changing insurance providers during the upcoming open enrollment period.  She had no other questions or concerns today.    Current Medication: Outpatient Encounter Medications as of 05/05/2021  Medication Sig   amLODipine (NORVASC) 10 MG tablet Take 1 tablet by mouth daily.   Daridorexant HCl (QUVIVIQ) 50 MG TABS Take 50 mg by mouth at bedtime.   Semaglutide (RYBELSUS) 3 MG TABS Take 3 mg by mouth daily before breakfast. Please take on an empty stomach with no more than 4 oz of water and wait 30 minutes before eating, drinking or taking any other medications.   valACYclovir (VALTREX) 500 MG tablet TAKE 1 TABLET BY MOUTH TWICE A DAY   [DISCONTINUED] ALPRAZolam (XANAX) 0.5 MG tablet Take 1 tablet (0.5 mg total) by mouth at bedtime as needed for anxiety.   [DISCONTINUED] amphetamine-dextroamphetamine (ADDERALL) 10 MG tablet Take 1 tablet (10 mg total) by mouth daily as needed.   [DISCONTINUED] amphetamine-dextroamphetamine (ADDERALL) 30 MG tablet Take 1 tablet by mouth daily.   [DISCONTINUED] pneumococcal 20-Val Conj Vacc (PREVNAR 20) 0.5 ML injection Inject 0.5 mLs into the muscle tomorrow at 10 am.   ALPRAZolam (XANAX) 0.5 MG tablet Take 1 tablet (0.5 mg total) by mouth at bedtime as needed for anxiety.   amphetamine-dextroamphetamine (ADDERALL) 10 MG tablet Take 1 tablet (10 mg total) by mouth daily as needed.    amphetamine-dextroamphetamine (ADDERALL) 30 MG tablet Take 1 tablet by mouth daily.   [START ON 06/02/2021] amphetamine-dextroamphetamine (ADDERALL) 30 MG tablet Take 1 tablet by mouth daily.   [START ON 06/30/2021] amphetamine-dextroamphetamine (ADDERALL) 30 MG tablet Take 1 tablet by mouth daily.   [EXPIRED] pneumococcal 20-Val Conj Vacc (PREVNAR 20) 0.5 ML injection Inject 0.5 mLs into the muscle tomorrow at 10 am for 1 dose.   No facility-administered encounter medications on file as of 05/05/2021.    Surgical History: Past Surgical History:  Procedure Laterality Date   BREAST BIOPSY Left 2002   benign   BUNIONECTOMY Right    CESAREAN SECTION      Medical History: Past Medical History:  Diagnosis Date   ADD (attention deficit disorder)    Pre-hypertension     Family History: Family History  Problem Relation Age of Onset   Lung cancer Father    Diabetes Sister    Breast cancer Neg Hx     Social History   Socioeconomic History   Marital status: Married    Spouse name: Not on file   Number of children: Not on file   Years of education: Not on file   Highest education level: Not on file  Occupational History   Not on file  Tobacco Use   Smoking status: Never   Smokeless tobacco: Never  Vaping Use   Vaping Use: Never used  Substance and Sexual Activity   Alcohol use: Yes    Comment: social   Drug use: Not Currently  Types: Marijuana    Comment: once in a while    Sexual activity: Not on file  Other Topics Concern   Not on file  Social History Narrative   Not on file   Social Determinants of Health   Financial Resource Strain: Not on file  Food Insecurity: Not on file  Transportation Needs: Not on file  Physical Activity: Not on file  Stress: Not on file  Social Connections: Not on file  Intimate Partner Violence: Not on file      Review of Systems  Constitutional:  Negative for chills, fatigue and unexpected weight change.  HENT:  Negative for  congestion, rhinorrhea, sneezing and sore throat.   Eyes:  Negative for redness.  Respiratory:  Negative for cough, chest tightness and shortness of breath.   Cardiovascular:  Negative for chest pain and palpitations.  Gastrointestinal:  Negative for abdominal pain, constipation, diarrhea, nausea and vomiting.  Genitourinary:  Negative for dysuria and frequency.  Musculoskeletal:  Negative for arthralgias, back pain, joint swelling and neck pain.  Skin:  Negative for rash.  Neurological: Negative.  Negative for tremors and numbness.  Hematological:  Negative for adenopathy. Does not bruise/bleed easily.  Psychiatric/Behavioral:  Positive for decreased concentration and sleep disturbance. Negative for behavioral problems (Depression), self-injury and suicidal ideas. The patient is nervous/anxious.    Vital Signs: BP 130/90 Comment: 132/94  Pulse 85   Temp 97.7 F (36.5 C)   Resp 16   Ht 4' 10.5" (1.486 m)   Wt 186 lb (84.4 kg)   SpO2 98%   BMI 38.21 kg/m    Physical Exam Vitals reviewed.  Constitutional:      General: She is not in acute distress.    Appearance: Normal appearance. She is obese. She is not ill-appearing.  HENT:     Head: Normocephalic and atraumatic.  Eyes:     Extraocular Movements: Extraocular movements intact.     Pupils: Pupils are equal, round, and reactive to light.  Cardiovascular:     Rate and Rhythm: Normal rate and regular rhythm.  Pulmonary:     Effort: Pulmonary effort is normal. No respiratory distress.  Neurological:     Mental Status: She is alert and oriented to person, place, and time.     Cranial Nerves: No cranial nerve deficit.     Coordination: Coordination normal.     Gait: Gait normal.  Psychiatric:        Mood and Affect: Mood normal. Affect is tearful.        Behavior: Behavior normal.    Assessment/Plan: 1. Encounter for long-term (current) use of high-risk medication UDS done today. Results are consistent with current  prescriptions.  - POCT Urine Drug Screen  2. Class 2 obesity due to excess calories without serious comorbidity with body mass index (BMI) of 39.0 to 39.9 in adult Patient is aware of necessary diet and lifestyle modifications that will help her lose weight. She wants to work on this more but needs to figure out her insurance problems prior to scheduling a return appointment to discuss weight loss management.   3. Primary insomnia Patient takes Dwana Curd for insomnia which helps a lot. She has been able to decrease the frequency at which she takes alprazolam. She no longer takes it every night.  - ALPRAZolam (XANAX) 0.5 MG tablet; Take 1 tablet (0.5 mg total) by mouth at bedtime as needed for anxiety.  Dispense: 30 tablet; Refill: 1  4. GAD (generalized anxiety disorder) Increased anxiety  due to needing to switch insurances or providers. Discussed in office today. Patient is feeling better, refill of alprazolam ordered.  - ALPRAZolam (XANAX) 0.5 MG tablet; Take 1 tablet (0.5 mg total) by mouth at bedtime as needed for anxiety.  Dispense: 30 tablet; Refill: 1  5. Attention deficit disorder (ADD) without hyperactivity Current medication and dose is efective, refills x3 months ordered.  - amphetamine-dextroamphetamine (ADDERALL) 10 MG tablet; Take 1 tablet (10 mg total) by mouth daily as needed.  Dispense: 30 tablet; Refill: 0 - amphetamine-dextroamphetamine (ADDERALL) 30 MG tablet; Take 1 tablet by mouth daily.  Dispense: 30 tablet; Refill: 0 - amphetamine-dextroamphetamine (ADDERALL) 30 MG tablet; Take 1 tablet by mouth daily.  Dispense: 30 tablet; Refill: 0 - amphetamine-dextroamphetamine (ADDERALL) 30 MG tablet; Take 1 tablet by mouth daily.  Dispense: 30 tablet; Refill: 0  6. Need for pneumococcal vaccination - pneumococcal 20-Val Conj Vacc (PREVNAR 20) 0.5 ML injection; Inject 0.5 mLs into the muscle tomorrow at 10 am for 1 dose.  Dispense: 0.5 mL; Refill: 0   General Counseling: Megan [name]  verbalizes understanding of the findings of todays visit and agrees with plan of treatment. I have discussed any further diagnostic evaluation that may be needed or ordered today. We also reviewed her medications today. she has been encouraged to call the office with any questions or concerns that should arise related to todays visit.    Orders Placed This Encounter  Procedures   POCT Urine Drug Screen    Meds ordered this encounter  Medications   pneumococcal 20-Val Conj Vacc (PREVNAR 20) 0.5 ML injection    Sig: Inject 0.5 mLs into the muscle tomorrow at 10 am for 1 dose.    Dispense:  0.5 mL    Refill:  0   amphetamine-dextroamphetamine (ADDERALL) 10 MG tablet    Sig: Take 1 tablet (10 mg total) by mouth daily as needed.    Dispense:  30 tablet    Refill:  0    To be taken as needed with 30 mg.   amphetamine-dextroamphetamine (ADDERALL) 30 MG tablet    Sig: Take 1 tablet by mouth daily.    Dispense:  30 tablet    Refill:  0   amphetamine-dextroamphetamine (ADDERALL) 30 MG tablet    Sig: Take 1 tablet by mouth daily.    Dispense:  30 tablet    Refill:  0   amphetamine-dextroamphetamine (ADDERALL) 30 MG tablet    Sig: Take 1 tablet by mouth daily.    Dispense:  30 tablet    Refill:  0   ALPRAZolam (XANAX) 0.5 MG tablet    Sig: Take 1 tablet (0.5 mg total) by mouth at bedtime as needed for anxiety.    Dispense:  30 tablet    Refill:  1    Return in about 3 months (around 08/05/2021) for F/U, ADHD med check, [place] PCP.   Total time spent:30 Minutes Time spent includes review of chart, medications, test results, and follow up plan with the patient.   [place] Controlled Substance Database was reviewed by me.  This patient was seen by Jonetta Osgood, FNP-C in collaboration with Dr. Clayborn Bigness as a part of collaborative care agreement.   Majesty Stehlin R. Valetta Fuller, MSN, FNP-C Internal medicine

## 2021-05-20 ENCOUNTER — Ambulatory Visit: Payer: BLUE CROSS/BLUE SHIELD | Admitting: Dermatology

## 2021-06-01 ENCOUNTER — Telehealth: Payer: BLUE CROSS/BLUE SHIELD | Admitting: Nurse Practitioner

## 2021-06-01 ENCOUNTER — Encounter: Payer: Self-pay | Admitting: Nurse Practitioner

## 2021-06-01 VITALS — Temp 96.6°F | Ht 59.0 in | Wt 185.0 lb

## 2021-06-01 DIAGNOSIS — J028 Acute pharyngitis due to other specified organisms: Secondary | ICD-10-CM

## 2021-06-01 DIAGNOSIS — U071 COVID-19: Secondary | ICD-10-CM | POA: Diagnosis not present

## 2021-06-01 DIAGNOSIS — B9789 Other viral agents as the cause of diseases classified elsewhere: Secondary | ICD-10-CM | POA: Diagnosis not present

## 2021-06-01 MED ORDER — NIRMATRELVIR/RITONAVIR (PAXLOVID)TABLET: 3.0000 | ORAL_TABLET | Freq: Two times a day (BID) | ORAL | 0 refills | Status: AC

## 2021-06-01 NOTE — Progress Notes (Signed)
[facility] Megan [name], [place] [address]  Internal MEDICINE  Telephone Visit  Patient Name: Megan [name]  [id number]  [id number]  Date of Service: 06/01/2021  I connected with the patient at 11:45 AM by video call and verified the patients identity using two identifiers.   I discussed the limitations, risks, security and privacy concerns of performing an evaluation and management service by telephone and the availability of in person appointments. I also discussed with the patient that there may be a patient responsible charge related to the service.  The patient expressed understanding and agrees to proceed.    Chief Complaint  Patient presents with   Telephone Assessment    Covid positive last night home covid test    Telephone Screen    [phone]   Sinusitis    HPI Megan [name] presents for a telehealth virtual visit for testing COVID positive last night. She wants to take paxlovid. She c/o symptoms of allergic rhinitis. She reports nasal congestion, runny nose, sneezing, sore throat, hoarseness, cough, mild SOB. She denies headache, fatigue, body aches, fever, chills, chest tightness, and wheezing. Symptoms started 2 days ago.    Current Medication: Outpatient Encounter Medications as of 06/01/2021  Medication Sig   ALPRAZolam (XANAX) 0.5 MG tablet Take 1 tablet (0.5 mg total) by mouth at bedtime as needed for anxiety.   amLODipine (NORVASC) 10 MG tablet Take 1 tablet by mouth daily.   amphetamine-dextroamphetamine (ADDERALL) 10 MG tablet Take 1 tablet (10 mg total) by mouth daily as needed.   amphetamine-dextroamphetamine (ADDERALL) 30 MG tablet Take 1 tablet by mouth daily.   [START ON 06/02/2021] amphetamine-dextroamphetamine (ADDERALL) 30 MG tablet Take 1 tablet by mouth daily.   [START ON 06/30/2021] amphetamine-dextroamphetamine (ADDERALL) 30 MG tablet Take 1 tablet by mouth daily.   Daridorexant HCl (QUVIVIQ) 50 MG TABS Take 50 mg by mouth at  bedtime.   nirmatrelvir/ritonavir EUA (PAXLOVID) 20 x 150 MG & 10 x 100MG  TABS Take 3 tablets by mouth 2 (two) times daily for 5 days. Take all 3 tablets together, with or without food.   Semaglutide (RYBELSUS) 3 MG TABS Take 3 mg by mouth daily before breakfast. Please take on an empty stomach with no more than 4 oz of water and wait 30 minutes before eating, drinking or taking any other medications.   valACYclovir (VALTREX) 500 MG tablet TAKE 1 TABLET BY MOUTH TWICE A DAY   No facility-administered encounter medications on file as of 06/01/2021.    Surgical History: Past Surgical History:  Procedure Laterality Date   BREAST BIOPSY Left 2002   benign   BUNIONECTOMY Right    CESAREAN SECTION      Medical History: Past Medical History:  Diagnosis Date   ADD (attention deficit disorder)    Pre-hypertension     Family History: Family History  Problem Relation Age of Onset   Lung cancer Father    Diabetes Sister    Breast cancer Neg Hx     Social History   Socioeconomic History   Marital status: Married    Spouse name: Not on file   Number of children: Not on file   Years of education: Not on file   Highest education level: Not on file  Occupational History   Not on file  Tobacco Use   Smoking status: Never   Smokeless tobacco: Never  Vaping Use   Vaping Use: Never used  Substance and Sexual Activity   Alcohol use: Yes  Comment: social   Drug use: Not Currently    Types: Marijuana    Comment: once in a while    Sexual activity: Not on file  Other Topics Concern   Not on file  Social History Narrative   Not on file   Social Determinants of Health   Financial Resource Strain: Not on file  Food Insecurity: Not on file  Transportation Needs: Not on file  Physical Activity: Not on file  Stress: Not on file  Social Connections: Not on file  Intimate Partner Violence: Not on file      Review of Systems  Constitutional:  Negative for appetite change,  chills, fatigue and fever.  HENT:  Positive for congestion, rhinorrhea, sneezing, sore throat and voice change. Negative for ear pain, postnasal drip, sinus pressure and sinus pain.   Respiratory:  Positive for cough and shortness of breath. Negative for chest tightness and wheezing.   Cardiovascular: Negative.  Negative for chest pain and palpitations.  Gastrointestinal: Negative.  Negative for abdominal pain, constipation, diarrhea, nausea and vomiting.  Genitourinary:  Negative for flank pain.  Musculoskeletal:  Positive for myalgias.  Neurological:  Negative for headaches.  Psychiatric/Behavioral: Negative.     Vital Signs: Temp (!) 96.6 F (35.9 C)   Ht 4\' 11"  (1.499 m)   Wt 185 lb (83.9 kg)   BMI 37.37 kg/m    Observation/Objective: Megan [name] is alert and oriented, engages in conversation appropriately. She does not appear to be in any acute distress.     Assessment/Plan: 1. COVID-19 virus infection COVID positive, paxlovid prescribed.  - nirmatrelvir/ritonavir EUA (PAXLOVID) 20 x 150 MG & 10 x 100MG  TABS; Take 3 tablets by mouth 2 (two) times daily for 5 days. Take all 3 tablets together, with or without food.  Dispense: 30 tablet; Refill: 0  2. Sore throat (viral) Symptomatic treatment with OTC meds - cough drops or chlorseptic   General Counseling: Jameson verbalizes understanding of the findings of today's phone visit and agrees with plan of treatment. I have discussed any further diagnostic evaluation that may be needed or ordered today. We also reviewed her medications today. she has been encouraged to call the office with any questions or concerns that should arise related to todays visit.  Return if symptoms worsen or fail to improve.   No orders of the defined types were placed in this encounter.   Meds ordered this encounter  Medications   nirmatrelvir/ritonavir EUA (PAXLOVID) 20 x 150 MG & 10 x 100MG  TABS    Sig: Take 3 tablets by mouth 2 (two) times daily for 5  days. Take all 3 tablets together, with or without food.    Dispense:  30 tablet    Refill:  0    Time spent:15 Minutes Time spent with patient included reviewing progress notes, labs, imaging studies, and discussing plan for follow up.  [place] Controlled Substance Database was reviewed by me for overdose risk score (ORS) if appropriate.  This patient was seen by Jonetta Osgood, FNP-C in collaboration with Dr. Clayborn Bigness as a part of collaborative care agreement.  Avian Greenawalt R. Valetta Fuller, MSN, FNP-C Internal medicine

## 2021-06-02 ENCOUNTER — Telehealth: Payer: BLUE CROSS/BLUE SHIELD | Admitting: Nurse Practitioner

## 2021-06-08 ENCOUNTER — Other Ambulatory Visit: Payer: Self-pay | Admitting: Nurse Practitioner

## 2021-06-08 DIAGNOSIS — E6609 Other obesity due to excess calories: Secondary | ICD-10-CM

## 2021-06-08 DIAGNOSIS — R7301 Impaired fasting glucose: Secondary | ICD-10-CM

## 2021-06-10 ENCOUNTER — Telehealth: Payer: Self-pay

## 2021-06-10 NOTE — Telephone Encounter (Signed)
Please see this

## 2021-06-10 NOTE — Telephone Encounter (Signed)
Please check this

## 2021-06-10 NOTE — Telephone Encounter (Signed)
PA sent for RYBELSUS 3 mg 06/10/21 @ 820 pm

## 2021-07-06 ENCOUNTER — Other Ambulatory Visit: Payer: Self-pay

## 2021-07-06 DIAGNOSIS — F5101 Primary insomnia: Secondary | ICD-10-CM

## 2021-07-07 ENCOUNTER — Other Ambulatory Visit: Payer: Self-pay

## 2021-07-07 DIAGNOSIS — F5101 Primary insomnia: Secondary | ICD-10-CM

## 2021-07-07 MED ORDER — QUVIVIQ 50 MG PO TABS: 50.0000 mg | ORAL_TABLET | Freq: Every day | ORAL | 3 refills | Status: DC

## 2021-07-08 ENCOUNTER — Other Ambulatory Visit: Payer: Self-pay | Admitting: Nurse Practitioner

## 2021-07-08 DIAGNOSIS — F5101 Primary insomnia: Secondary | ICD-10-CM

## 2021-07-08 MED ORDER — QUVIVIQ 50 MG PO TABS: 50.0000 mg | ORAL_TABLET | Freq: Every day | ORAL | 3 refills | Status: DC

## 2021-07-16 ENCOUNTER — Other Ambulatory Visit: Payer: Self-pay

## 2021-07-16 ENCOUNTER — Other Ambulatory Visit: Payer: Self-pay | Admitting: Nurse Practitioner

## 2021-07-16 ENCOUNTER — Telehealth: Payer: Self-pay

## 2021-07-16 DIAGNOSIS — F5101 Primary insomnia: Secondary | ICD-10-CM

## 2021-07-16 MED ORDER — QUVIVIQ 50 MG PO TABS: 50.0000 mg | ORAL_TABLET | Freq: Every day | ORAL | 2 refills | Status: DC

## 2021-07-16 MED ORDER — QUVIVIQ 50 MG PO TABS: 50.0000 mg | ORAL_TABLET | Freq: Every day | ORAL | 3 refills | Status: DC

## 2021-07-17 ENCOUNTER — Other Ambulatory Visit: Payer: Self-pay | Admitting: Internal Medicine

## 2021-07-17 DIAGNOSIS — I10 Essential (primary) hypertension: Secondary | ICD-10-CM

## 2021-07-19 NOTE — Telephone Encounter (Signed)
error 

## 2021-08-03 ENCOUNTER — Ambulatory Visit: Payer: BLUE CROSS/BLUE SHIELD | Admitting: Nurse Practitioner

## 2021-08-03 ENCOUNTER — Other Ambulatory Visit: Payer: Self-pay

## 2021-08-03 ENCOUNTER — Encounter: Payer: Self-pay | Admitting: Nurse Practitioner

## 2021-08-03 DIAGNOSIS — F411 Generalized anxiety disorder: Secondary | ICD-10-CM

## 2021-08-03 DIAGNOSIS — F5101 Primary insomnia: Secondary | ICD-10-CM | POA: Diagnosis not present

## 2021-08-03 DIAGNOSIS — B009 Herpesviral infection, unspecified: Secondary | ICD-10-CM | POA: Diagnosis not present

## 2021-08-03 DIAGNOSIS — F988 Other specified behavioral and emotional disorders with onset usually occurring in childhood and adolescence: Secondary | ICD-10-CM | POA: Diagnosis not present

## 2021-08-03 MED ORDER — ALPRAZOLAM 0.5 MG PO TABS
0.5000 mg | ORAL_TABLET | Freq: Every evening | ORAL | 1 refills | Status: DC | PRN
Start: 2021-08-03 — End: 2021-09-27

## 2021-08-03 MED ORDER — AMPHETAMINE-DEXTROAMPHETAMINE 30 MG PO TABS
30.0000 mg | ORAL_TABLET | Freq: Every day | ORAL | 0 refills | Status: DC
Start: 2021-08-31 — End: 2021-09-27

## 2021-08-03 MED ORDER — AMPHETAMINE-DEXTROAMPHETAMINE 30 MG PO TABS
30.0000 mg | ORAL_TABLET | Freq: Every day | ORAL | 0 refills | Status: DC
Start: 2021-09-28 — End: 2021-09-27

## 2021-08-03 MED ORDER — MELOXICAM 7.5 MG PO TABS: 7.5000 mg | ORAL_TABLET | Freq: Every day | ORAL | 2 refills | Status: DC

## 2021-08-03 MED ORDER — AMPHETAMINE-DEXTROAMPHETAMINE 10 MG PO TABS: 10.0000 mg | ORAL_TABLET | Freq: Every day | ORAL | 0 refills | Status: DC | PRN

## 2021-08-03 MED ORDER — VALACYCLOVIR HCL 500 MG PO TABS: 500.0000 mg | ORAL_TABLET | Freq: Two times a day (BID) | ORAL | 2 refills | Status: DC

## 2021-08-03 MED ORDER — AMPHETAMINE-DEXTROAMPHETAMINE 30 MG PO TABS
30.0000 mg | ORAL_TABLET | Freq: Every day | ORAL | 0 refills | Status: DC
Start: 2021-08-03 — End: 2021-09-22

## 2021-08-03 NOTE — Progress Notes (Signed)
[facility] [place], [place] [address]  Internal MEDICINE  Office Visit Note  Patient Name: Megan [name]  [id number]  [id number]  Date of Service: 08/03/2021  Chief Complaint  Patient presents with   Follow-up   Medication Refill    HPI Megan [name] presents for follow-up visit for medication refills and insomnia.  She continues to take Adderall for ADHD.  She continues to take quviviq to help her sleep.  She also takes alprazolam as needed.  Her blood pressure is not well controlled with amlodipine 10 mg daily.  She has also started taking taurine which she has heard can also help with blood pressure.     Current Medication: Outpatient Encounter Medications as of 08/03/2021  Medication Sig   amLODipine (NORVASC) 10 MG tablet TAKE 1 TABLET BY MOUTH EVERY DAY   amphetamine-dextroamphetamine (ADDERALL) 30 MG tablet Take 1 tablet by mouth daily.   amphetamine-dextroamphetamine (ADDERALL) 30 MG tablet Take 1 tablet by mouth daily.   Daridorexant HCl (QUVIVIQ) 50 MG TABS Take 50 mg by mouth at bedtime.   [DISCONTINUED] ALPRAZolam (XANAX) 0.5 MG tablet Take 1 tablet (0.5 mg total) by mouth at bedtime as needed for anxiety.   [DISCONTINUED] amphetamine-dextroamphetamine (ADDERALL) 10 MG tablet Take 1 tablet (10 mg total) by mouth daily as needed.   [DISCONTINUED] amphetamine-dextroamphetamine (ADDERALL) 30 MG tablet Take 1 tablet by mouth daily.   [DISCONTINUED] Semaglutide (RYBELSUS) 3 MG TABS Take 3 mg by mouth daily before breakfast. Please take on an empty stomach with no more than 4 oz of water and wait 30 minutes before eating, drinking or taking any other medications.   [DISCONTINUED] valACYclovir (VALTREX) 500 MG tablet TAKE 1 TABLET BY MOUTH TWICE A DAY   ALPRAZolam (XANAX) 0.5 MG tablet Take 1 tablet (0.5 mg total) by mouth at bedtime as needed for anxiety or sleep.   amphetamine-dextroamphetamine (ADDERALL) 10 MG tablet Take 1 tablet (10 mg total) by  mouth daily as needed.   amphetamine-dextroamphetamine (ADDERALL) 30 MG tablet Take 1 tablet by mouth daily.   amphetamine-dextroamphetamine (ADDERALL) 30 MG tablet Take 1 tablet by mouth daily.   [START ON 09/28/2021] amphetamine-dextroamphetamine (ADDERALL) 30 MG tablet Take 1 tablet by mouth daily.   meloxicam (MOBIC) 7.5 MG tablet Take 1 tablet (7.5 mg total) by mouth daily.   [DISCONTINUED] meloxicam (MOBIC) 7.5 MG tablet Mobic 7.5 mg tablet  Take 1 tablet twice a day by oral route.   [DISCONTINUED] valACYclovir (VALTREX) 500 MG tablet Take 1 tablet (500 mg total) by mouth 2 (two) times daily.   No facility-administered encounter medications on file as of 08/03/2021.    Surgical History: Past Surgical History:  Procedure Laterality Date   BREAST BIOPSY Left 2002   benign   BUNIONECTOMY Right    CESAREAN SECTION      Medical History: Past Medical History:  Diagnosis Date   ADD (attention deficit disorder)    Pre-hypertension     Family History: Family History  Problem Relation Age of Onset   Lung cancer Father    Diabetes Sister    Breast cancer Neg Hx     Social History   Socioeconomic History   Marital status: Married    Spouse name: Not on file   Number of children: Not on file   Years of education: Not on file   Highest education level: Not on file  Occupational History   Not on file  Tobacco Use   Smoking status: Never  Smokeless tobacco: Never  Vaping Use   Vaping Use: Never used  Substance and Sexual Activity   Alcohol use: Yes    Comment: social   Drug use: Not Currently    Types: Marijuana    Comment: once in a while    Sexual activity: Not on file  Other Topics Concern   Not on file  Social History Narrative   Not on file   Social Determinants of Health   Financial Resource Strain: Not on file  Food Insecurity: Not on file  Transportation Needs: Not on file  Physical Activity: Not on file  Stress: Not on file  Social Connections: Not  on file  Intimate Partner Violence: Not on file      Review of Systems  Constitutional:  Negative for chills, fatigue and unexpected weight change.  HENT:  Negative for congestion, rhinorrhea, sneezing and sore throat.   Eyes:  Negative for redness.  Respiratory:  Negative for cough, chest tightness and shortness of breath.   Cardiovascular:  Negative for chest pain and palpitations.  Gastrointestinal:  Negative for abdominal pain, constipation, diarrhea, nausea and vomiting.  Genitourinary:  Negative for dysuria and frequency.  Musculoskeletal:  Negative for arthralgias, back pain, joint swelling and neck pain.  Skin:  Negative for rash.  Neurological: Negative.  Negative for tremors and numbness.  Hematological:  Negative for adenopathy. Does not bruise/bleed easily.  Psychiatric/Behavioral:  Negative for behavioral problems (Depression), sleep disturbance and suicidal ideas. The patient is not nervous/anxious.    Vital Signs: BP 123/70   Pulse 98   Temp 98.8 F (37.1 C)   Resp 16   Ht 4\' 10"  (1.473 m)   Wt 184 lb (83.5 kg)   SpO2 97%   BMI 38.46 kg/m    Physical Exam Vitals reviewed.  Constitutional:      General: She is not in acute distress.    Appearance: Normal appearance. She is obese. She is not ill-appearing.  HENT:     Head: Normocephalic and atraumatic.  Eyes:     Pupils: Pupils are equal, round, and reactive to light.  Cardiovascular:     Rate and Rhythm: Normal rate and regular rhythm.  Neurological:     Mental Status: She is alert and oriented to person, place, and time.     Cranial Nerves: No cranial nerve deficit.     Coordination: Coordination normal.     Gait: Gait normal.  Psychiatric:        Mood and Affect: Mood normal.        Behavior: Behavior normal.       Assessment/Plan: 1. HSV (herpes simplex virus) infection Valacyclovir refill ordered  2. Attention deficit disorder (ADD) without hyperactivity Medication and dose effective,  refills ordered, follow up in 3 months, will need UDS next office visit.  - amphetamine-dextroamphetamine (ADDERALL) 30 MG tablet; Take 1 tablet by mouth daily.  Dispense: 30 tablet; Refill: 0 - amphetamine-dextroamphetamine (ADDERALL) 30 MG tablet; Take 1 tablet by mouth daily.  Dispense: 30 tablet; Refill: 0 - amphetamine-dextroamphetamine (ADDERALL) 30 MG tablet; Take 1 tablet by mouth daily.  Dispense: 30 tablet; Refill: 0 - amphetamine-dextroamphetamine (ADDERALL) 10 MG tablet; Take 1 tablet (10 mg total) by mouth daily as needed.  Dispense: 30 tablet; Refill: 0  3. Primary insomnia Refill ordered. Takes quviviq and alprazolam for sleep - ALPRAZolam (XANAX) 0.5 MG tablet; Take 1 tablet (0.5 mg total) by mouth at bedtime as needed for anxiety or sleep.  Dispense: 30 tablet;  Refill: 1  4. GAD (generalized anxiety disorder) Refill ordered.  - ALPRAZolam (XANAX) 0.5 MG tablet; Take 1 tablet (0.5 mg total) by mouth at bedtime as needed for anxiety or sleep.  Dispense: 30 tablet; Refill: 1   General Counseling: Alga verbalizes understanding of the findings of todays visit and agrees with plan of treatment. I have discussed any further diagnostic evaluation that may be needed or ordered today. We also reviewed her medications today. she has been encouraged to call the office with any questions or concerns that should arise related to todays visit.    No orders of the defined types were placed in this encounter.   Meds ordered this encounter  Medications   amphetamine-dextroamphetamine (ADDERALL) 30 MG tablet    Sig: Take 1 tablet by mouth daily.    Dispense:  30 tablet    Refill:  0    Fill for november   amphetamine-dextroamphetamine (ADDERALL) 30 MG tablet    Sig: Take 1 tablet by mouth daily.    Dispense:  30 tablet    Refill:  0    Fill for december   amphetamine-dextroamphetamine (ADDERALL) 30 MG tablet    Sig: Take 1 tablet by mouth daily.    Dispense:  30 tablet     Refill:  0    Fill for january   amphetamine-dextroamphetamine (ADDERALL) 10 MG tablet    Sig: Take 1 tablet (10 mg total) by mouth daily as needed.    Dispense:  30 tablet    Refill:  0    To be taken as needed with 30 mg.   meloxicam (MOBIC) 7.5 MG tablet    Sig: Take 1 tablet (7.5 mg total) by mouth daily.    Dispense:  30 tablet    Refill:  2   DISCONTD: valACYclovir (VALTREX) 500 MG tablet    Sig: Take 1 tablet (500 mg total) by mouth 2 (two) times daily.    Dispense:  30 tablet    Refill:  2   ALPRAZolam (XANAX) 0.5 MG tablet    Sig: Take 1 tablet (0.5 mg total) by mouth at bedtime as needed for anxiety or sleep.    Dispense:  30 tablet    Refill:  1    Return in about 3 months (around 11/03/2021) for F/U, anxiety med refill, ADHD med check, [place] PCP.   Total time spent:30 Minutes Time spent includes review of chart, medications, test results, and follow up plan with the patient.   [place] Controlled Substance Database was reviewed by me.  This patient was seen by Jonetta Osgood, FNP-C in collaboration with Dr. Clayborn Bigness as a part of collaborative care agreement.   Scottlynn Lindell R. Valetta Fuller, MSN, FNP-C Internal medicine

## 2021-08-04 ENCOUNTER — Ambulatory Visit: Payer: BLUE CROSS/BLUE SHIELD | Admitting: Nurse Practitioner

## 2021-08-11 ENCOUNTER — Other Ambulatory Visit: Payer: Self-pay | Admitting: Nurse Practitioner

## 2021-08-11 DIAGNOSIS — B009 Herpesviral infection, unspecified: Secondary | ICD-10-CM

## 2021-08-24 ENCOUNTER — Encounter: Payer: Self-pay | Admitting: Nurse Practitioner

## 2021-08-24 DIAGNOSIS — F988 Other specified behavioral and emotional disorders with onset usually occurring in childhood and adolescence: Secondary | ICD-10-CM

## 2021-08-28 ENCOUNTER — Other Ambulatory Visit: Payer: Self-pay | Admitting: Nurse Practitioner

## 2021-08-28 DIAGNOSIS — B009 Herpesviral infection, unspecified: Secondary | ICD-10-CM

## 2021-08-31 ENCOUNTER — Encounter: Payer: Self-pay | Admitting: Nurse Practitioner

## 2021-09-17 ENCOUNTER — Other Ambulatory Visit: Payer: Self-pay | Admitting: Nurse Practitioner

## 2021-09-17 DIAGNOSIS — B009 Herpesviral infection, unspecified: Secondary | ICD-10-CM

## 2021-09-21 ENCOUNTER — Other Ambulatory Visit: Payer: Self-pay | Admitting: Internal Medicine

## 2021-09-21 NOTE — Telephone Encounter (Signed)
Please update dfk

## 2021-09-22 MED ORDER — AMPHETAMINE-DEXTROAMPHETAMINE 10 MG PO TABS: 10.0000 mg | ORAL_TABLET | Freq: Every day | ORAL | 0 refills | Status: DC | PRN

## 2021-09-22 NOTE — Telephone Encounter (Signed)
LMOM informing pt that Alyssa sent in the rx for adderall 10 mg to her pharmacy and will discuss the issues at next visit

## 2021-09-27 ENCOUNTER — Ambulatory Visit: Payer: 59 | Admitting: Nurse Practitioner

## 2021-09-27 ENCOUNTER — Other Ambulatory Visit: Payer: Self-pay

## 2021-09-27 ENCOUNTER — Encounter: Payer: Self-pay | Admitting: Nurse Practitioner

## 2021-09-27 VITALS — BP 140/80 | HR 91 | Temp 98.0°F | Resp 16 | Ht <= 58 in | Wt 184.2 lb

## 2021-09-27 DIAGNOSIS — Z79899 Other long term (current) drug therapy: Secondary | ICD-10-CM | POA: Diagnosis not present

## 2021-09-27 DIAGNOSIS — K224 Dyskinesia of esophagus: Secondary | ICD-10-CM

## 2021-09-27 DIAGNOSIS — F411 Generalized anxiety disorder: Secondary | ICD-10-CM

## 2021-09-27 DIAGNOSIS — F988 Other specified behavioral and emotional disorders with onset usually occurring in childhood and adolescence: Secondary | ICD-10-CM

## 2021-09-27 DIAGNOSIS — Z1211 Encounter for screening for malignant neoplasm of colon: Secondary | ICD-10-CM | POA: Diagnosis not present

## 2021-09-27 DIAGNOSIS — R69 Illness, unspecified: Secondary | ICD-10-CM | POA: Diagnosis not present

## 2021-09-27 DIAGNOSIS — F5101 Primary insomnia: Secondary | ICD-10-CM

## 2021-09-27 DIAGNOSIS — Z1212 Encounter for screening for malignant neoplasm of rectum: Secondary | ICD-10-CM | POA: Diagnosis not present

## 2021-09-27 LAB — POCT URINE DRUG SCREEN
Methylenedioxyamphetamine: NOT DETECTED
POC Amphetamine UR: POSITIVE — AB
POC BENZODIAZEPINES UR: NOT DETECTED
POC Barbiturate UR: NOT DETECTED
POC Cocaine UR: NOT DETECTED
POC Ecstasy UR: NOT DETECTED
POC Marijuana UR: NOT DETECTED
POC Methadone UR: NOT DETECTED
POC Methamphetamine UR: NOT DETECTED
POC Opiate Ur: NOT DETECTED
POC Oxycodone UR: NOT DETECTED
POC PHENCYCLIDINE UR: NOT DETECTED
POC TRICYCLICS UR: NOT DETECTED

## 2021-09-27 MED ORDER — AMPHETAMINE-DEXTROAMPHETAMINE 10 MG PO TABS: 10.0000 mg | ORAL_TABLET | Freq: Every day | ORAL | 0 refills | Status: DC | PRN

## 2021-09-27 MED ORDER — AMPHETAMINE-DEXTROAMPHETAMINE 30 MG PO TABS: 30.0000 mg | ORAL_TABLET | Freq: Every day | ORAL | 0 refills | Status: DC

## 2021-09-27 MED ORDER — QUVIVIQ 50 MG PO TABS: 50.0000 mg | ORAL_TABLET | Freq: Every day | ORAL | 2 refills | Status: DC

## 2021-09-27 MED ORDER — ALPRAZOLAM 0.5 MG PO TABS: 0.5000 mg | ORAL_TABLET | Freq: Every evening | ORAL | 1 refills | Status: DC | PRN

## 2021-09-27 NOTE — Progress Notes (Signed)
[facility] [place], [place] [address]  Internal MEDICINE  Office Visit Note  Patient Name: Megan [name]  [id number]  [id number]  Date of Service: 10/28/2021  Chief Complaint  Patient presents with   Follow-up   Medication Refill   GI Problem    Still having some GI issues, might need new referral bc of new insurance    HPI Megan [name] presents for a follow up visit for medication refills and having some GI issues that she needs a referral for. She has new insurance so she needs a new referral. She has spasms in her distal esophagus that have been identified with imaging. She is also due for screening colonoscopy.  She reports that her current medication and dose for ADHD is effective. She denies any adverse side effects of the medication and her blood pressure and heart rate are stable.  She continues to take quviviq for sleep and it is helping and she takes alprazolam for anxiety.  She is due for UDS.     Current Medication: Outpatient Encounter Medications as of 09/27/2021  Medication Sig   amLODipine (NORVASC) 10 MG tablet TAKE 1 TABLET BY MOUTH EVERY DAY   meloxicam (MOBIC) 7.5 MG tablet Take 1 tablet (7.5 mg total) by mouth daily.   [DISCONTINUED] ALPRAZolam (XANAX) 0.5 MG tablet Take 1 tablet (0.5 mg total) by mouth at bedtime as needed for anxiety or sleep.   [DISCONTINUED] amphetamine-dextroamphetamine (ADDERALL) 10 MG tablet Take 1 tablet (10 mg total) by mouth daily as needed.   [DISCONTINUED] amphetamine-dextroamphetamine (ADDERALL) 30 MG tablet Take 1 tablet by mouth daily.   [DISCONTINUED] amphetamine-dextroamphetamine (ADDERALL) 30 MG tablet Take 1 tablet by mouth daily.   [DISCONTINUED] Daridorexant HCl (QUVIVIQ) 50 MG TABS Take 50 mg by mouth at bedtime.   [DISCONTINUED] valACYclovir (VALTREX) 500 MG tablet TAKE 1 TABLET BY MOUTH TWICE A DAY   ALPRAZolam (XANAX) 0.5 MG tablet Take 1 tablet (0.5 mg total) by mouth at bedtime as needed  for anxiety or sleep.   amphetamine-dextroamphetamine (ADDERALL) 10 MG tablet Take 1 tablet (10 mg total) by mouth daily as needed.   [START ON 11/23/2021] amphetamine-dextroamphetamine (ADDERALL) 10 MG tablet Take 1 tablet (10 mg total) by mouth daily as needed.   [START ON 12/21/2021] amphetamine-dextroamphetamine (ADDERALL) 10 MG tablet Take 1 tablet (10 mg total) by mouth daily as needed.   amphetamine-dextroamphetamine (ADDERALL) 30 MG tablet Take 1 tablet by mouth daily.   [START ON 11/23/2021] amphetamine-dextroamphetamine (ADDERALL) 30 MG tablet Take 1 tablet by mouth daily.   [START ON 12/21/2021] amphetamine-dextroamphetamine (ADDERALL) 30 MG tablet Take 1 tablet by mouth daily.   Daridorexant HCl (QUVIVIQ) 50 MG TABS Take 50 mg by mouth at bedtime.   No facility-administered encounter medications on file as of 09/27/2021.    Surgical History: Past Surgical History:  Procedure Laterality Date   BREAST BIOPSY Left 2002   benign   BUNIONECTOMY Right    CESAREAN SECTION      Medical History: Past Medical History:  Diagnosis Date   ADD (attention deficit disorder)    Pre-hypertension     Family History: Family History  Problem Relation Age of Onset   Lung cancer Father    Diabetes Sister    Breast cancer Neg Hx     Social History   Socioeconomic History   Marital status: Married    Spouse name: Not on file   Number of children: Not on file   Years of education: Not  on file   Highest education level: Not on file  Occupational History   Not on file  Tobacco Use   Smoking status: Never   Smokeless tobacco: Never  Vaping Use   Vaping Use: Never used  Substance and Sexual Activity   Alcohol use: Yes    Comment: social   Drug use: Not Currently    Types: Marijuana    Comment: once in a while    Sexual activity: Not on file  Other Topics Concern   Not on file  Social History Narrative   Not on file   Social Determinants of Health   Financial Resource Strain:  Not on file  Food Insecurity: Not on file  Transportation Needs: Not on file  Physical Activity: Not on file  Stress: Not on file  Social Connections: Not on file  Intimate Partner Violence: Not on file      Review of Systems  Constitutional:  Negative for chills, fatigue and unexpected weight change.  HENT:  Negative for congestion, rhinorrhea, sneezing and sore throat.   Eyes:  Negative for redness.  Respiratory:  Negative for cough, chest tightness and shortness of breath.   Cardiovascular:  Negative for chest pain and palpitations.  Gastrointestinal:  Negative for abdominal pain, constipation, diarrhea, nausea and vomiting.  Genitourinary:  Negative for dysuria and frequency.  Musculoskeletal:  Negative for arthralgias, back pain, joint swelling and neck pain.  Skin:  Negative for rash.  Neurological: Negative.  Negative for tremors and numbness.  Hematological:  Negative for adenopathy. Does not bruise/bleed easily.  Psychiatric/Behavioral:  Negative for behavioral problems (Depression), sleep disturbance and suicidal ideas. The patient is not nervous/anxious.    Vital Signs: BP 140/80    Pulse 91    Temp 98 F (36.7 C)    Resp 16    Ht 4\' 10"  (1.473 m)    Wt 184 lb 3.2 oz (83.6 kg)    SpO2 98%    BMI 38.50 kg/m    Physical Exam Vitals reviewed.  Constitutional:      General: She is not in acute distress.    Appearance: Normal appearance. She is obese. She is not ill-appearing.  HENT:     Head: Normocephalic and atraumatic.  Eyes:     Pupils: Pupils are equal, round, and reactive to light.  Cardiovascular:     Rate and Rhythm: Normal rate and regular rhythm.  Pulmonary:     Effort: Pulmonary effort is normal. No respiratory distress.  Neurological:     Mental Status: She is alert and oriented to person, place, and time.     Cranial Nerves: No cranial nerve deficit.     Coordination: Coordination normal.     Gait: Gait normal.  Psychiatric:        Mood and  Affect: Mood normal.        Behavior: Behavior normal.       Assessment/Plan: 1. Esophageal spasm Imaging showed possible spasm in distal esophagus. May need endoscopy, refer to GI for further evaluation. - Ambulatory referral to Gastroenterology  2. Screening for colorectal cancer Referred to GI for routine colonoscopy - Ambulatory referral to Gastroenterology  3. Encounter for long-term (current) use of high-risk medication UDS done in office, results are consistent with current prescriptions - POCT Urine Drug Screen  4. Attention deficit disorder (ADD) without hyperactivity Refills ordered, current medication and dose remains effective. Refills x3 months ordered, will need follow up in 3 months for additional refills.  - amphetamine-dextroamphetamine (  ADDERALL) 30 MG tablet; Take 1 tablet by mouth daily.  Dispense: 30 tablet; Refill: 0 - amphetamine-dextroamphetamine (ADDERALL) 30 MG tablet; Take 1 tablet by mouth daily.  Dispense: 30 tablet; Refill: 0 - amphetamine-dextroamphetamine (ADDERALL) 30 MG tablet; Take 1 tablet by mouth daily.  Dispense: 30 tablet; Refill: 0 - amphetamine-dextroamphetamine (ADDERALL) 10 MG tablet; Take 1 tablet (10 mg total) by mouth daily as needed.  Dispense: 30 tablet; Refill: 0 - amphetamine-dextroamphetamine (ADDERALL) 10 MG tablet; Take 1 tablet (10 mg total) by mouth daily as needed.  Dispense: 30 tablet; Refill: 0 - amphetamine-dextroamphetamine (ADDERALL) 10 MG tablet; Take 1 tablet (10 mg total) by mouth daily as needed.  Dispense: 30 tablet; Refill: 0  5. Primary insomnia Continue medications as prescribed, refills ordered.  - Daridorexant HCl (QUVIVIQ) 50 MG TABS; Take 50 mg by mouth at bedtime.  Dispense: 30 tablet; Refill: 2 - ALPRAZolam (XANAX) 0.5 MG tablet; Take 1 tablet (0.5 mg total) by mouth at bedtime as needed for anxiety or sleep.  Dispense: 30 tablet; Refill: 1  6. GAD (generalized anxiety disorder) Stable, continue medication  as prescribed. Refills ordered.  - ALPRAZolam (XANAX) 0.5 MG tablet; Take 1 tablet (0.5 mg total) by mouth at bedtime as needed for anxiety or sleep.  Dispense: 30 tablet; Refill: 1   General Counseling: Megan [name] verbalizes understanding of the findings of todays visit and agrees with plan of treatment. I have discussed any further diagnostic evaluation that may be needed or ordered today. We also reviewed her medications today. she has been encouraged to call the office with any questions or concerns that should arise related to todays visit.    Orders Placed This Encounter  Procedures   Ambulatory referral to Gastroenterology   POCT Urine Drug Screen    Meds ordered this encounter  Medications   amphetamine-dextroamphetamine (ADDERALL) 30 MG tablet    Sig: Take 1 tablet by mouth daily.    Dispense:  30 tablet    Refill:  0    Fill for february   amphetamine-dextroamphetamine (ADDERALL) 30 MG tablet    Sig: Take 1 tablet by mouth daily.    Dispense:  30 tablet    Refill:  0    Fill for march   amphetamine-dextroamphetamine (ADDERALL) 30 MG tablet    Sig: Take 1 tablet by mouth daily.    Dispense:  30 tablet    Refill:  0    Fill for april   amphetamine-dextroamphetamine (ADDERALL) 10 MG tablet    Sig: Take 1 tablet (10 mg total) by mouth daily as needed.    Dispense:  30 tablet    Refill:  0    To be taken as needed with 30 mg. Fill for february   amphetamine-dextroamphetamine (ADDERALL) 10 MG tablet    Sig: Take 1 tablet (10 mg total) by mouth daily as needed.    Dispense:  30 tablet    Refill:  0    To be taken as needed with 30 mg. Fill for march   amphetamine-dextroamphetamine (ADDERALL) 10 MG tablet    Sig: Take 1 tablet (10 mg total) by mouth daily as needed.    Dispense:  30 tablet    Refill:  0    To be taken as needed with 30 mg. Fill for april   Daridorexant HCl (QUVIVIQ) 50 MG TABS    Sig: Take 50 mg by mouth at bedtime.    Dispense:  30 tablet    Refill:   2  ALPRAZolam (XANAX) 0.5 MG tablet    Sig: Take 1 tablet (0.5 mg total) by mouth at bedtime as needed for anxiety or sleep.    Dispense:  30 tablet    Refill:  1    Return in about 3 months (around 12/26/2021) for F/U, ADHD med check, anxiety med refill, [facility] Sayegh PCP.   Total time spent:30 Minutes Time spent includes review of chart, medications, test results, and follow up plan with the patient.   [place] Controlled Substance Database was reviewed by me.  This patient was seen by Jonetta Osgood, FNP-C in collaboration with Dr. Clayborn Bigness as a part of collaborative care agreement.   Odaly Peri R. Valetta Fuller, MSN, FNP-C Internal medicine

## 2021-09-28 ENCOUNTER — Telehealth: Payer: Self-pay

## 2021-09-28 NOTE — Telephone Encounter (Signed)
CALLED PATIENT NO ANSWER LEFT VOICEMAIL FOR A CALL BACK ? ?

## 2021-09-29 ENCOUNTER — Telehealth: Payer: Self-pay

## 2021-09-29 NOTE — Telephone Encounter (Signed)
CALLED PATIENT NO ANSWER LEFT VOICEMAIL FOR A CALL BACK  letter sent 

## 2021-10-04 ENCOUNTER — Other Ambulatory Visit: Payer: Self-pay | Admitting: Nurse Practitioner

## 2021-10-04 DIAGNOSIS — B009 Herpesviral infection, unspecified: Secondary | ICD-10-CM

## 2021-10-08 ENCOUNTER — Telehealth: Payer: Self-pay

## 2021-10-08 NOTE — Telephone Encounter (Signed)
Pt returning your call to schedule colonoscopy.

## 2021-10-11 ENCOUNTER — Other Ambulatory Visit: Payer: Self-pay | Admitting: Nurse Practitioner

## 2021-10-11 DIAGNOSIS — B009 Herpesviral infection, unspecified: Secondary | ICD-10-CM

## 2021-10-12 ENCOUNTER — Other Ambulatory Visit: Payer: Self-pay

## 2021-10-12 NOTE — Progress Notes (Signed)
Had to schedule a in person visit having gi issues

## 2021-10-18 ENCOUNTER — Encounter: Payer: Self-pay | Admitting: Nurse Practitioner

## 2021-10-18 DIAGNOSIS — M170 Bilateral primary osteoarthritis of knee: Secondary | ICD-10-CM

## 2021-10-19 ENCOUNTER — Telehealth: Payer: Self-pay

## 2021-10-19 DIAGNOSIS — M170 Bilateral primary osteoarthritis of knee: Secondary | ICD-10-CM | POA: Diagnosis not present

## 2021-10-19 DIAGNOSIS — G8929 Other chronic pain: Secondary | ICD-10-CM | POA: Diagnosis not present

## 2021-10-19 DIAGNOSIS — M25561 Pain in right knee: Secondary | ICD-10-CM | POA: Diagnosis not present

## 2021-10-19 DIAGNOSIS — M1712 Unilateral primary osteoarthritis, left knee: Secondary | ICD-10-CM | POA: Diagnosis not present

## 2021-10-19 NOTE — Telephone Encounter (Signed)
Need note closed to send referral.

## 2021-10-19 NOTE — Telephone Encounter (Signed)
Awaiting 09/27/21 office notes for ortho surgery referral-Toni

## 2021-10-28 ENCOUNTER — Encounter: Payer: Self-pay | Admitting: Nurse Practitioner

## 2021-10-29 NOTE — Telephone Encounter (Signed)
Manually faxed referral to Dr. Vickey Huger [phone]

## 2021-11-03 ENCOUNTER — Other Ambulatory Visit: Payer: Self-pay | Admitting: Nurse Practitioner

## 2021-11-03 ENCOUNTER — Ambulatory Visit: Payer: 59 | Admitting: Nurse Practitioner

## 2021-11-03 DIAGNOSIS — B009 Herpesviral infection, unspecified: Secondary | ICD-10-CM

## 2021-11-16 ENCOUNTER — Other Ambulatory Visit: Payer: Self-pay | Admitting: Nurse Practitioner

## 2021-11-16 DIAGNOSIS — B009 Herpesviral infection, unspecified: Secondary | ICD-10-CM

## 2021-11-18 DIAGNOSIS — J301 Allergic rhinitis due to pollen: Secondary | ICD-10-CM | POA: Diagnosis not present

## 2021-11-18 DIAGNOSIS — J3489 Other specified disorders of nose and nasal sinuses: Secondary | ICD-10-CM | POA: Diagnosis not present

## 2021-12-22 ENCOUNTER — Ambulatory Visit: Payer: 59 | Admitting: Nurse Practitioner

## 2021-12-24 ENCOUNTER — Ambulatory Visit: Payer: 59 | Admitting: Nurse Practitioner

## 2021-12-27 DIAGNOSIS — J343 Hypertrophy of nasal turbinates: Secondary | ICD-10-CM | POA: Diagnosis not present

## 2021-12-27 DIAGNOSIS — J301 Allergic rhinitis due to pollen: Secondary | ICD-10-CM | POA: Diagnosis not present

## 2021-12-27 DIAGNOSIS — J3489 Other specified disorders of nose and nasal sinuses: Secondary | ICD-10-CM | POA: Diagnosis not present

## 2021-12-28 ENCOUNTER — Ambulatory Visit: Payer: 59 | Admitting: Nurse Practitioner

## 2021-12-28 ENCOUNTER — Encounter: Payer: Self-pay | Admitting: Nurse Practitioner

## 2021-12-28 DIAGNOSIS — I10 Essential (primary) hypertension: Secondary | ICD-10-CM | POA: Diagnosis not present

## 2021-12-28 DIAGNOSIS — F411 Generalized anxiety disorder: Secondary | ICD-10-CM

## 2021-12-28 DIAGNOSIS — F988 Other specified behavioral and emotional disorders with onset usually occurring in childhood and adolescence: Secondary | ICD-10-CM | POA: Diagnosis not present

## 2021-12-28 DIAGNOSIS — F5101 Primary insomnia: Secondary | ICD-10-CM

## 2021-12-28 DIAGNOSIS — R69 Illness, unspecified: Secondary | ICD-10-CM | POA: Diagnosis not present

## 2021-12-28 MED ORDER — ALPRAZOLAM 0.5 MG PO TABS: 0.5000 mg | ORAL_TABLET | Freq: Every evening | ORAL | 0 refills | Status: DC | PRN

## 2021-12-28 MED ORDER — AMPHETAMINE-DEXTROAMPHETAMINE 30 MG PO TABS: 30.0000 mg | ORAL_TABLET | Freq: Every day | ORAL | 0 refills | Status: DC

## 2021-12-28 MED ORDER — AMPHETAMINE-DEXTROAMPHETAMINE 10 MG PO TABS: 10.0000 mg | ORAL_TABLET | Freq: Every day | ORAL | 0 refills | Status: DC | PRN

## 2021-12-28 MED ORDER — AMLODIPINE BESYLATE 10 MG PO TABS: 10.0000 mg | ORAL_TABLET | Freq: Every day | ORAL | 3 refills | Status: DC

## 2021-12-28 MED ORDER — QUVIVIQ 50 MG PO TABS: 50.0000 mg | ORAL_TABLET | Freq: Every day | ORAL | 2 refills | Status: DC

## 2021-12-28 NOTE — Progress Notes (Signed)
[facility] ?[address] ?[place], [place] [address] ? ?Internal MEDICINE  ?Office Visit Note ? ?Patient Name: Megan [name] ? [id number]  ?[id number] ? ?Date of Service: 12/28/2021 ? ?Chief Complaint  ?Patient presents with  ? Follow-up  ? Medication Refill  ? ? ?HPI ?Megan [name] presents for follow-up visit for ADHD, hypertension, insomnia and generalized anxiety disorder.  She is due for medication refills.  She reports that the Megan [name] is still working well for her at night and she does not take the alprazolam every day.  The current dose of Adderall is working well for her and she denies any adverse side effects of medication her blood pressure is stable and well-controlled with current medications and her heart rate is within normal limits.  She reports that she does have an upcoming initial appointment with the gastroenterologist to look into the issue with her esophageal spasm. ? ? ? ?Current Medication: ?Outpatient Encounter Medications as of 12/28/2021  ?Medication Sig  ? valACYclovir (VALTREX) 500 MG tablet TAKE 1 TABLET BY MOUTH TWICE A DAY  ? [DISCONTINUED] ALPRAZolam (XANAX) 0.5 MG tablet Take 1 tablet (0.5 mg total) by mouth at bedtime as needed for anxiety or sleep.  ? [DISCONTINUED] amLODipine (NORVASC) 10 MG tablet TAKE 1 TABLET BY MOUTH EVERY DAY  ? [DISCONTINUED] amphetamine-dextroamphetamine (ADDERALL) 10 MG tablet Take 1 tablet (10 mg total) by mouth daily as needed.  ? [DISCONTINUED] amphetamine-dextroamphetamine (ADDERALL) 10 MG tablet Take 1 tablet (10 mg total) by mouth daily as needed.  ? [DISCONTINUED] amphetamine-dextroamphetamine (ADDERALL) 10 MG tablet Take 1 tablet (10 mg total) by mouth daily as needed.  ? [DISCONTINUED] amphetamine-dextroamphetamine (ADDERALL) 30 MG tablet Take 1 tablet by mouth daily.  ? [DISCONTINUED] amphetamine-dextroamphetamine (ADDERALL) 30 MG tablet Take 1 tablet by mouth daily.  ? [DISCONTINUED] amphetamine-dextroamphetamine (ADDERALL) 30 MG  tablet Take 1 tablet by mouth daily.  ? [DISCONTINUED] Daridorexant HCl (QUVIVIQ) 50 MG TABS Take 50 mg by mouth at bedtime.  ? [DISCONTINUED] meloxicam (MOBIC) 7.5 MG tablet Take 1 tablet (7.5 mg total) by mouth daily.  ? ALPRAZolam (XANAX) 0.5 MG tablet Take 1 tablet (0.5 mg total) by mouth at bedtime as needed for anxiety or sleep.  ? amLODipine (NORVASC) 10 MG tablet Take 1 tablet (10 mg total) by mouth daily.  ? amphetamine-dextroamphetamine (ADDERALL) 10 MG tablet Take 1 tablet (10 mg total) by mouth daily as needed.  ? [START ON 01/25/2022] amphetamine-dextroamphetamine (ADDERALL) 10 MG tablet Take 1 tablet (10 mg total) by mouth daily as needed.  ? [START ON 02/22/2022] amphetamine-dextroamphetamine (ADDERALL) 10 MG tablet Take 1 tablet (10 mg total) by mouth daily as needed.  ? amphetamine-dextroamphetamine (ADDERALL) 30 MG tablet Take 1 tablet by mouth daily.  ? [START ON 01/25/2022] amphetamine-dextroamphetamine (ADDERALL) 30 MG tablet Take 1 tablet by mouth daily.  ? [START ON 02/22/2022] amphetamine-dextroamphetamine (ADDERALL) 30 MG tablet Take 1 tablet by mouth daily.  ? Daridorexant HCl (QUVIVIQ) 50 MG TABS Take 50 mg by mouth at bedtime.  ? ?No facility-administered encounter medications on file as of 12/28/2021.  ? ? ?Surgical History: ?Past Surgical History:  ?Procedure Laterality Date  ? BREAST BIOPSY Left 2002  ? benign  ? BUNIONECTOMY Right   ? CESAREAN SECTION    ? ? ?Medical History: ?Past Medical History:  ?Diagnosis Date  ? ADD (attention deficit disorder)   ? Pre-hypertension   ? ? ?Family History: ?Family History  ?Problem Relation Age of Onset  ? Lung cancer Father   ?  Diabetes Sister   ? Breast cancer Neg Hx   ? ? ?Social History  ? ?Socioeconomic History  ? Marital status: Married  ?  Spouse name: Not on file  ? Number of children: Not on file  ? Years of education: Not on file  ? Highest education level: Not on file  ?Occupational History  ? Not on file  ?Tobacco Use  ? Smoking status:  Never  ? Smokeless tobacco: Never  ?Vaping Use  ? Vaping Use: Never used  ?Substance and Sexual Activity  ? Alcohol use: Yes  ?  Comment: social  ? Drug use: Not Currently  ?  Types: Marijuana  ?  Comment: once in a while   ? Sexual activity: Not on file  ?Other Topics Concern  ? Not on file  ?Social History Narrative  ? Not on file  ? ?Social Determinants of Health  ? ?Financial Resource Strain: Not on file  ?Food Insecurity: Not on file  ?Transportation Needs: Not on file  ?Physical Activity: Not on file  ?Stress: Not on file  ?Social Connections: Not on file  ?Intimate Partner Violence: Not on file  ? ? ? ? ?Review of Systems  ?Constitutional:  Negative for chills, fatigue and unexpected weight change.  ?HENT:  Negative for congestion, rhinorrhea, sneezing and sore throat.   ?Eyes:  Negative for redness.  ?Respiratory: Negative.  Negative for cough, chest tightness, shortness of breath and wheezing.   ?Cardiovascular: Negative.  Negative for chest pain and palpitations.  ?Gastrointestinal:  Negative for abdominal pain, constipation, diarrhea, nausea and vomiting.  ?     Esophageal spasm  ?Genitourinary:  Negative for dysuria and frequency.  ?Musculoskeletal:  Negative for arthralgias, back pain, joint swelling and neck pain.  ?Skin:  Negative for rash.  ?Neurological: Negative.  Negative for tremors and numbness.  ?Hematological:  Negative for adenopathy. Does not bruise/bleed easily.  ?Psychiatric/Behavioral:  Positive for decreased concentration and sleep disturbance. Negative for behavioral problems (Depression) and suicidal ideas. The patient is nervous/anxious.   ? ?Vital Signs: ?BP 124/74   Pulse 92   Temp 97.8 ?F (36.6 ?C)   Resp 16   Ht '4\' 10"'$  (1.473 m)   Wt 182 lb 12.8 oz (82.9 kg)   SpO2 97%   BMI 38.21 kg/m?  ? ? ?Physical Exam ?Vitals reviewed.  ?Constitutional:   ?   General: She is not in acute distress. ?   Appearance: Normal appearance. She is obese. She is not ill-appearing.  ?HENT:  ?    Head: Normocephalic and atraumatic.  ?Eyes:  ?   Pupils: Pupils are equal, round, and reactive to light.  ?Cardiovascular:  ?   Rate and Rhythm: Normal rate and regular rhythm.  ?Pulmonary:  ?   Effort: Pulmonary effort is normal. No respiratory distress.  ?Neurological:  ?   Mental Status: She is alert and oriented to person, place, and time.  ?Psychiatric:     ?   Mood and Affect: Mood normal.     ?   Behavior: Behavior normal.  ? ? ? ? ? ?Assessment/Plan: ?1. Essential hypertension ?Stable and well controlled with current dose of amlodipine. Refills ordered.  ?- amLODipine (NORVASC) 10 MG tablet; Take 1 tablet (10 mg total) by mouth daily.  Dispense: 90 tablet; Refill: 3 ? ?2. Primary insomnia ?Stable, refills ordered.  ?- ALPRAZolam (XANAX) 0.5 MG tablet; Take 1 tablet (0.5 mg total) by mouth at bedtime as needed for anxiety or sleep.  Dispense: 30  tablet; Refill: 0 ?- Daridorexant HCl (QUVIVIQ) 50 MG TABS; Take 50 mg by mouth at bedtime.  Dispense: 30 tablet; Refill: 2 ? ?3. GAD (generalized anxiety disorder) ?Stable, refill ordered ?- ALPRAZolam (XANAX) 0.5 MG tablet; Take 1 tablet (0.5 mg total) by mouth at bedtime as needed for anxiety or sleep.  Dispense: 30 tablet; Refill: 0 ? ?4. Attention deficit disorder (ADD) without hyperactivity ?Stable and doing well with current dose, refills ordered x3 months, follow up in 3 months for additional refills.  ?- amphetamine-dextroamphetamine (ADDERALL) 30 MG tablet; Take 1 tablet by mouth daily.  Dispense: 30 tablet; Refill: 0 ?- amphetamine-dextroamphetamine (ADDERALL) 10 MG tablet; Take 1 tablet (10 mg total) by mouth daily as needed.  Dispense: 30 tablet; Refill: 0 ?- amphetamine-dextroamphetamine (ADDERALL) 30 MG tablet; Take 1 tablet by mouth daily.  Dispense: 30 tablet; Refill: 0 ?- amphetamine-dextroamphetamine (ADDERALL) 30 MG tablet; Take 1 tablet by mouth daily.  Dispense: 30 tablet; Refill: 0 ?- amphetamine-dextroamphetamine (ADDERALL) 10 MG tablet; Take 1  tablet (10 mg total) by mouth daily as needed.  Dispense: 30 tablet; Refill: 0 ?- amphetamine-dextroamphetamine (ADDERALL) 10 MG tablet; Take 1 tablet (10 mg total) by mouth daily as needed.  Dispense: 30 tablet; Re

## 2021-12-30 ENCOUNTER — Encounter: Payer: Self-pay | Admitting: Gastroenterology

## 2021-12-30 ENCOUNTER — Ambulatory Visit: Payer: 59 | Admitting: Gastroenterology

## 2021-12-30 DIAGNOSIS — Z1211 Encounter for screening for malignant neoplasm of colon: Secondary | ICD-10-CM | POA: Diagnosis not present

## 2021-12-30 DIAGNOSIS — R1319 Other dysphagia: Secondary | ICD-10-CM | POA: Diagnosis not present

## 2021-12-30 IMAGING — MG DIGITAL SCREENING BILAT W/ TOMO W/ CAD
6 of 12 series · 6 of 36 positions shown · non-contrast
Comparison: Previous exam(s).

CLINICAL DATA: Screening.

EXAM:
DIGITAL SCREENING BILATERAL MAMMOGRAM WITH TOMO AND CAD

[R MLO synth-2D (1 of 2)]
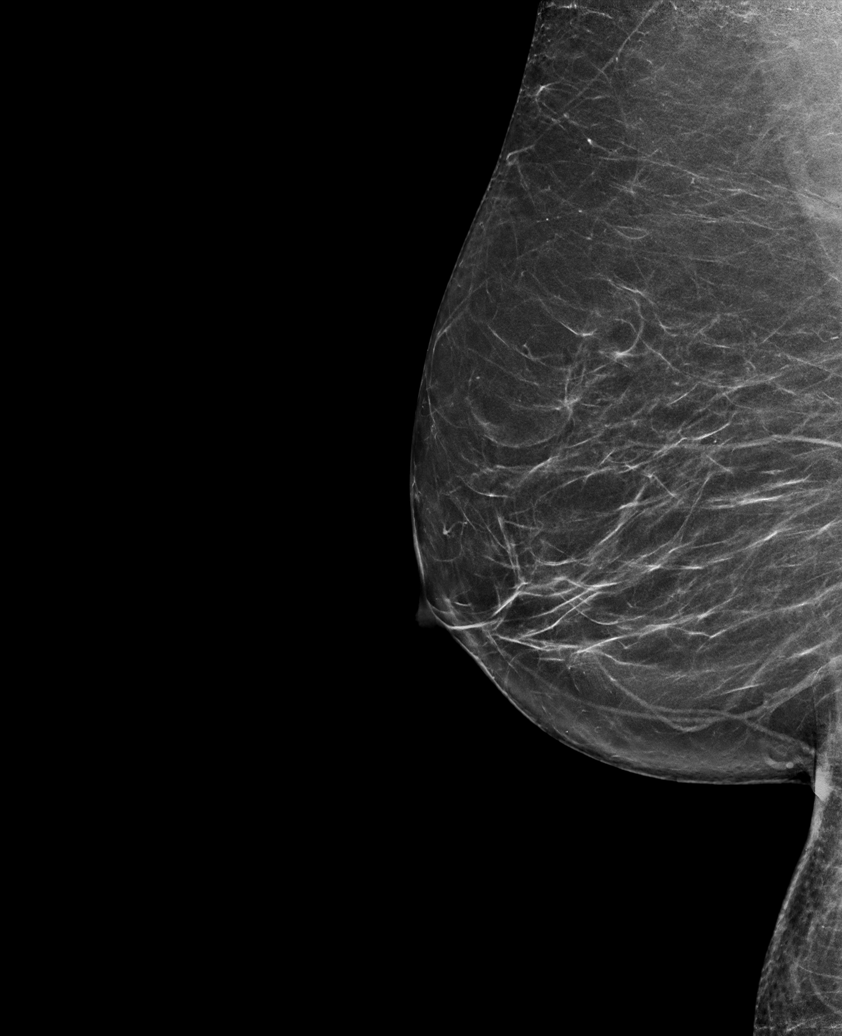

[R CC synth-2D]
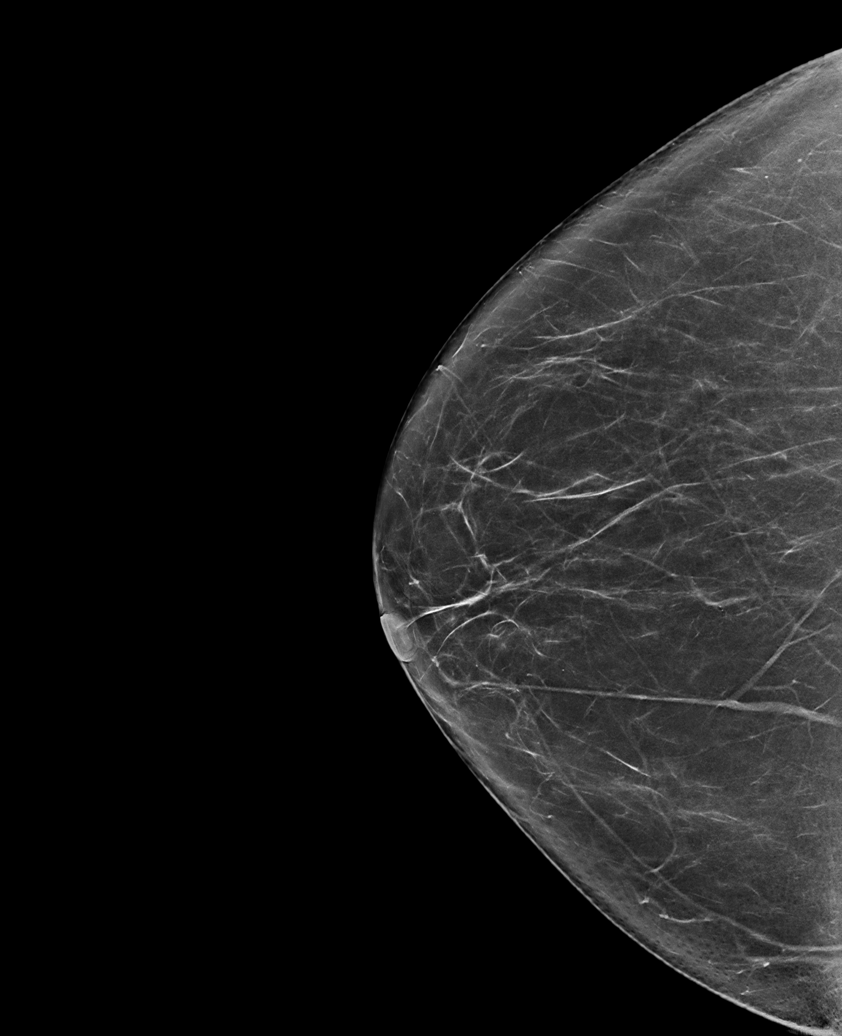

[L CC synth-2D]
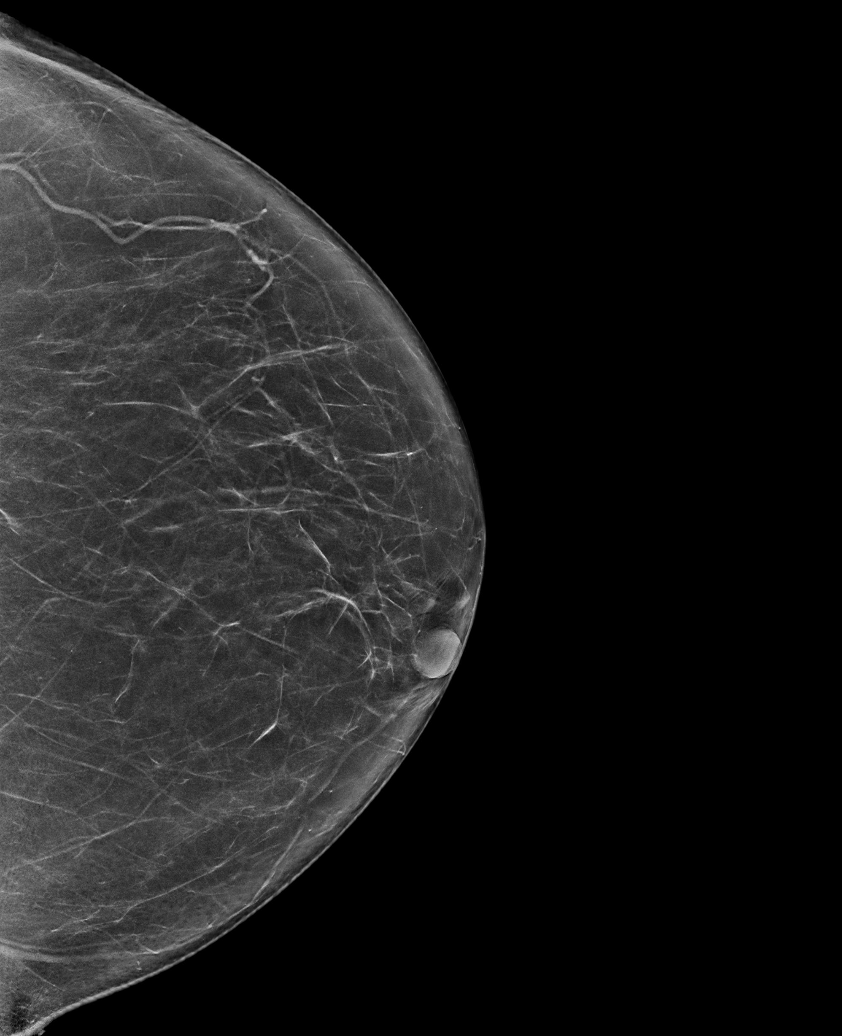

[L MLO synth-2D]
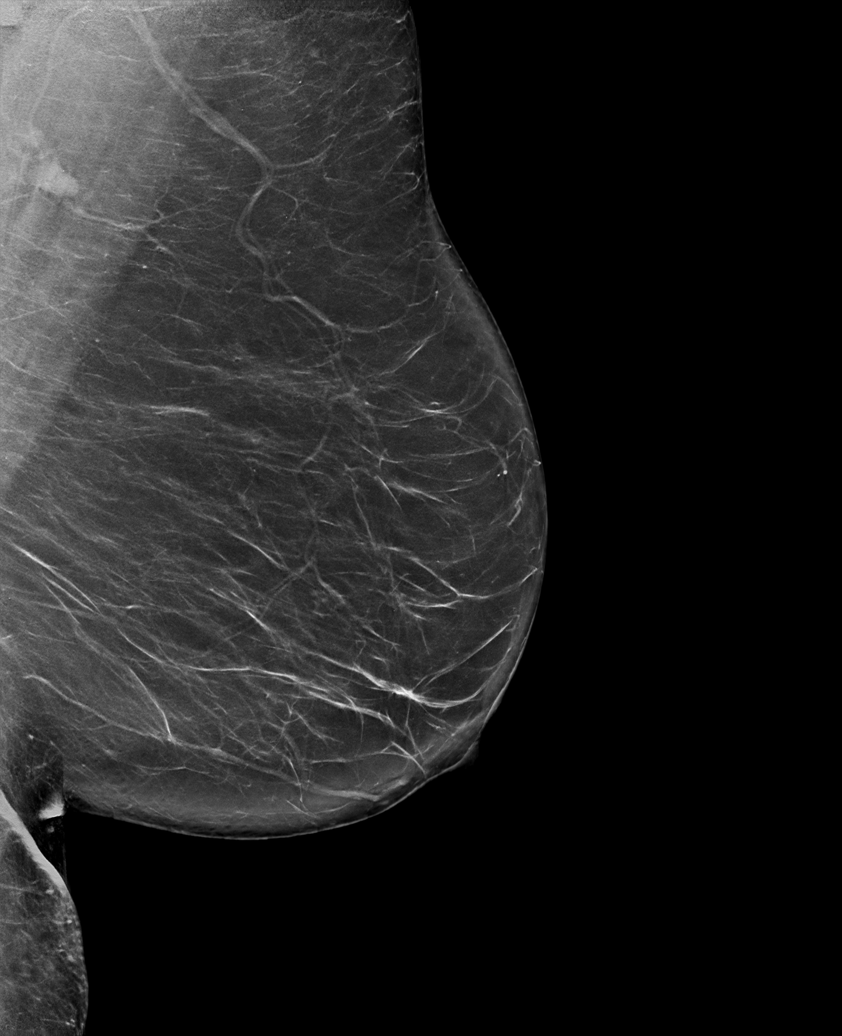

[R MLO synth-2D (2 of 2)]
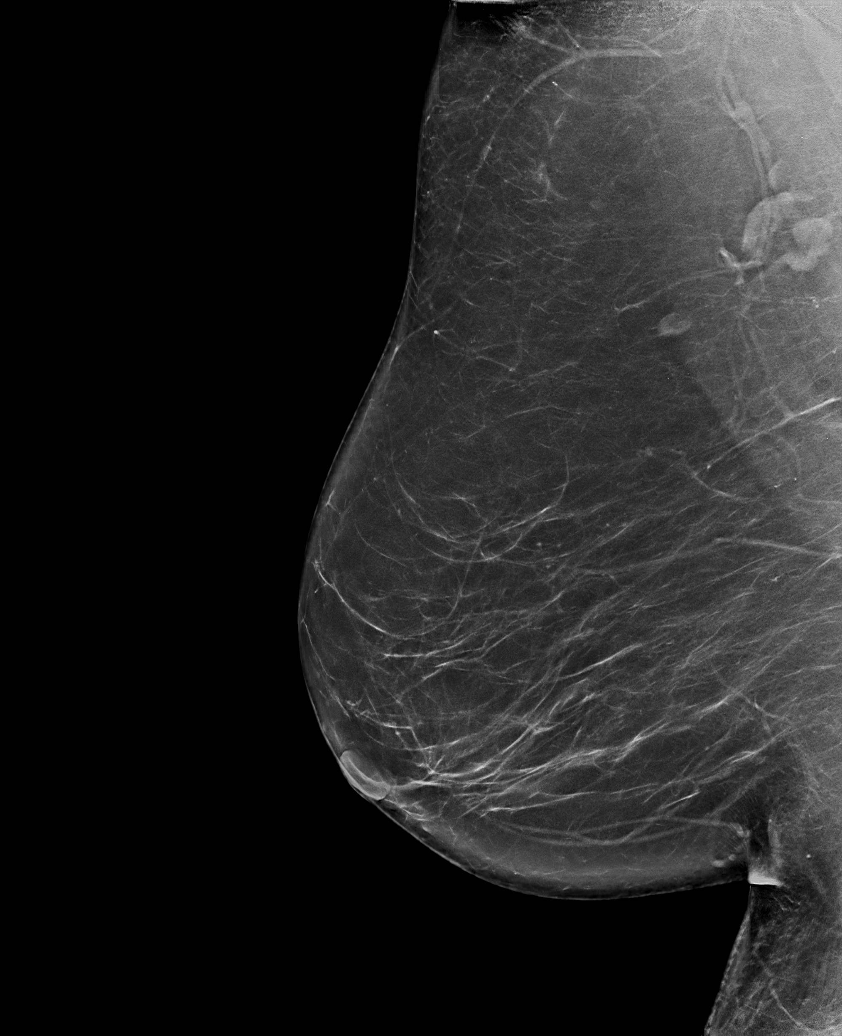

[L CV synth-2D]
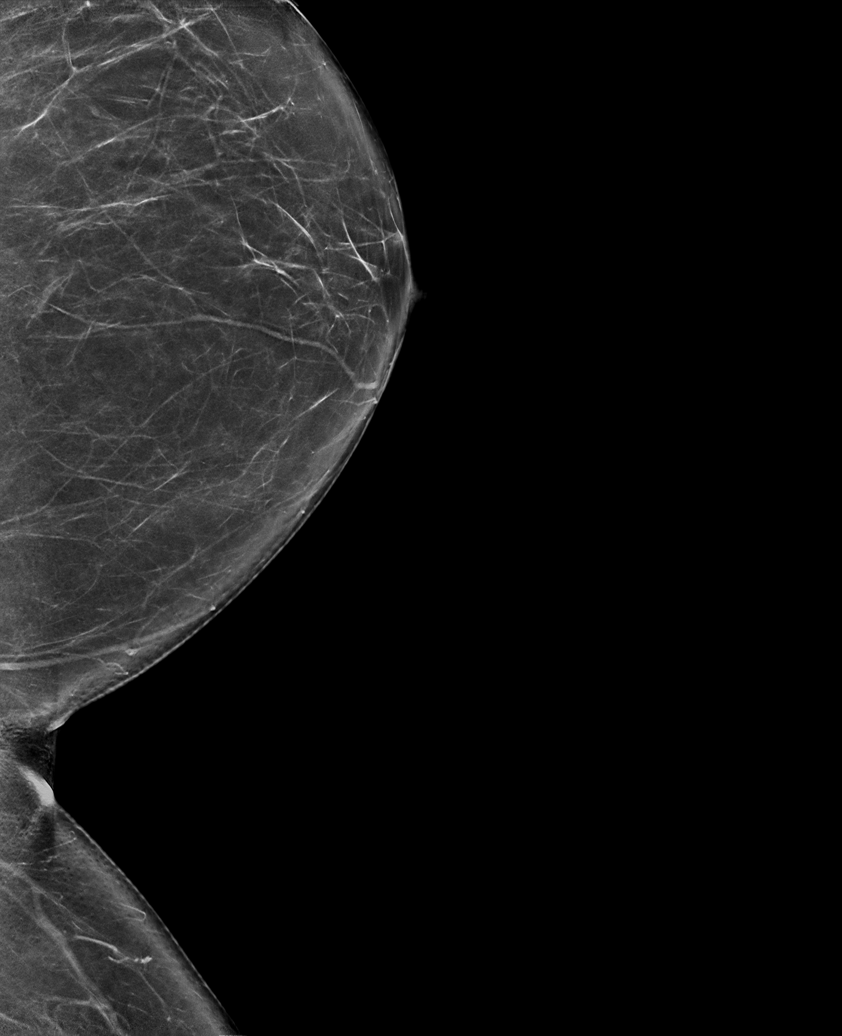

[6 of 36 positions shown; findings below may reference images not displayed]

ACR Breast Density Category b: There are scattered areas of
fibroglandular density.
FINDINGS: There are no findings suspicious for malignancy. Images were
processed with CAD.
IMPRESSION: No mammographic evidence of malignancy. A result letter of this
screening mammogram will be mailed directly to the patient.

RECOMMENDATION:
Screening mammogram in one year. (Code:CN-[id number])

BI-RADS CATEGORY  1: Negative.

## 2021-12-30 MED ORDER — NA SULFATE-K SULFATE-MG SULF 17.5-3.13-1.6 GM/177ML PO SOLN
1.0000 | Freq: Once | ORAL | 0 refills | Status: AC
Start: 2021-12-30 — End: 2021-12-30

## 2021-12-30 NOTE — Progress Notes (Signed)
? ? ?Gastroenterology Consultation ? ?Referring Provider:     Jonetta Osgood, NP ?Primary Care Physician:  Jonetta Osgood, NP ?Primary Gastroenterologist:  Dr. Allen Norris     ?Reason for Consultation:     Dysphagia ?      ? HPI:   ?Megan [name] is a 61 y.o. y/o female referred for consultation & management of Dysphagia by Dr. Jonetta Osgood, NP.  This patient comes in today with a history of trouble swallowing.  She reports that at certain times she will have a tightness in her chest and has a negative cardiac workup.  She thinks that she is having esophageal spasms.  The patient was seen in the ER and recommended to follow up with GI. The patient reports that she has no heartburn since she has lost approximately 20 pounds.  There is no report of any black stools or bloody stools.  She also reports that she had a negative Cologard test a few years ago.  The patient denies that food is gotten stuck in her esophagus so severely that she has had to bring it up or that she has had to go to the ER due to food impaction. ? ? ?Past Medical History:  ?Diagnosis Date  ? ADD (attention deficit disorder)   ? Pre-hypertension   ? ? ?Past Surgical History:  ?Procedure Laterality Date  ? BREAST BIOPSY Left 2002  ? benign  ? BUNIONECTOMY Right   ? CESAREAN SECTION    ? ? ?Prior to Admission medications   ?Medication Sig Start Date End Date Taking? Authorizing Provider  ?ALPRAZolam (XANAX) 0.5 MG tablet Take 1 tablet (0.5 mg total) by mouth at bedtime as needed for anxiety or sleep. 12/28/21  Yes Abernathy, Yetta Flock, NP  ?amLODipine (NORVASC) 10 MG tablet Take 1 tablet (10 mg total) by mouth daily. 12/28/21  Yes Abernathy, Yetta Flock, NP  ?amphetamine-dextroamphetamine (ADDERALL) 10 MG tablet Take 1 tablet (10 mg total) by mouth daily as needed. 12/28/21  Yes Abernathy, Yetta Flock, NP  ?amphetamine-dextroamphetamine (ADDERALL) 10 MG tablet Take 1 tablet (10 mg total) by mouth daily as needed. 01/25/22  Yes Abernathy, Yetta Flock, NP   ?amphetamine-dextroamphetamine (ADDERALL) 10 MG tablet Take 1 tablet (10 mg total) by mouth daily as needed. 02/22/22  Yes Abernathy, Yetta Flock, NP  ?amphetamine-dextroamphetamine (ADDERALL) 30 MG tablet Take 1 tablet by mouth daily. 12/28/21  Yes Abernathy, Yetta Flock, NP  ?amphetamine-dextroamphetamine (ADDERALL) 30 MG tablet Take 1 tablet by mouth daily. 01/25/22  Yes Abernathy, Yetta Flock, NP  ?amphetamine-dextroamphetamine (ADDERALL) 30 MG tablet Take 1 tablet by mouth daily. 02/22/22  Yes Jonetta Osgood, NP  ?Daridorexant HCl (QUVIVIQ) 50 MG TABS Take 50 mg by mouth at bedtime. 12/28/21  Yes Abernathy, Yetta Flock, NP  ?valACYclovir (VALTREX) 500 MG tablet TAKE 1 TABLET BY MOUTH TWICE A DAY 11/16/21  Yes Jonetta Osgood, NP  ? ? ?Family History  ?Problem Relation Age of Onset  ? Lung cancer Father   ? Diabetes Sister   ? Breast cancer Neg Hx   ?  ? ?Social History  ? ?Tobacco Use  ? Smoking status: Never  ? Smokeless tobacco: Never  ?Vaping Use  ? Vaping Use: Never used  ?Substance Use Topics  ? Alcohol use: Yes  ?  Comment: social  ? Drug use: Not Currently  ?  Types: Marijuana  ?  Comment: once in a while   ? ? ?Allergies as of 12/30/2021 - Review Complete 12/30/2021  ?Allergen Reaction Noted  ? Codeine Other (See Comments) 12/11/2013  ?  Morphine Hives 04/03/2015  ? Morphine and related Hives 12/11/2013  ? Vicodin [hydrocodone-acetaminophen] Other (See Comments) 12/11/2013  ? Nickel Itching and Rash 04/03/2015  ? ? ?Review of Systems:    ?All systems reviewed and negative except where noted in HPI. ? ? Physical Exam:  ?There were no vitals taken for this visit. ?No LMP recorded. Patient is postmenopausal. ?General:   Alert,  Well-developed, well-nourished, pleasant and cooperative in NAD ?Head:  Normocephalic and atraumatic. ?Eyes:  Sclera clear, no icterus.   Conjunctiva pink. ?Ears:  Normal auditory acuity. ?Neck:  Supple; no masses or thyromegaly. ?Lungs:  Respirations even and unlabored.  Clear throughout to  auscultation.   No wheezes, crackles, or rhonchi. No acute distress. ?Heart:  Regular rate and rhythm; no murmurs, clicks, rubs, or gallops. ?Abdomen:  Normal bowel sounds.  No bruits.  Soft, non-tender and non-distended without masses, hepatosplenomegaly or hernias noted.  No guarding or rebound tenderness.  Negative Carnett sign.   ?Rectal:  Deferred.  ?Pulses:  Normal pulses noted. ?Extremities:  No clubbing or edema.  No cyanosis. ?Neurologic:  Alert and oriented x3;  grossly normal neurologically. ?Skin:  Intact without significant lesions or rashes.  No jaundice. ?Lymph Nodes:  No significant cervical adenopathy. ?Psych:  Alert and cooperative. Normal mood and affect. ? ?Imaging Studies: ?No results found. ? ?Assessment and Plan:  ? ?Megan [name] is a 61 y.o. y/o female who comes in today with a history of dysphagia that may be related to esophageal spasms.  The patient is not having any heartburn at present time.  The patient will be set up for a EGD to look for a possible stricture as the cause of her symptoms are the patient has been told that if there are signs of reflux that she may need to be on a PPI and see if her symptoms improve.  If it is esophageal spasm she may need medication for that.  The patient would also like to be set up for colonoscopy at the same time of the EGD for screening.  The patient has been explained the plan and agrees with it. ? ? ? ?Lucilla Lame, MD. [facility] ? ? ? Note: This dictation was prepared with Dragon dictation along with smaller phrase technology. Any transcriptional errors that result from this process are unintentional.   ?

## 2021-12-30 NOTE — H&P (View-Only) (Signed)
? ? ?Gastroenterology Consultation ? ?Referring Provider:     Jonetta Osgood, NP ?Primary Care Physician:  Jonetta Osgood, NP ?Primary Gastroenterologist:  Dr. Allen Norris     ?Reason for Consultation:     Dysphagia ?      ? HPI:   ?Megan [name] is a 61 y.o. y/o female referred for consultation & management of Dysphagia by Dr. Jonetta Osgood, NP.  This patient comes in today with a history of trouble swallowing.  She reports that at certain times she will have a tightness in her chest and has a negative cardiac workup.  She thinks that she is having esophageal spasms.  The patient was seen in the ER and recommended to follow up with GI. The patient reports that she has no heartburn since she has lost approximately 20 pounds.  There is no report of any black stools or bloody stools.  She also reports that she had a negative Cologard test a few years ago.  The patient denies that food is gotten stuck in her esophagus so severely that she has had to bring it up or that she has had to go to the ER due to food impaction. ? ? ?Past Medical History:  ?Diagnosis Date  ? ADD (attention deficit disorder)   ? Pre-hypertension   ? ? ?Past Surgical History:  ?Procedure Laterality Date  ? BREAST BIOPSY Left 2002  ? benign  ? BUNIONECTOMY Right   ? CESAREAN SECTION    ? ? ?Prior to Admission medications   ?Medication Sig Start Date End Date Taking? Authorizing Provider  ?ALPRAZolam (XANAX) 0.5 MG tablet Take 1 tablet (0.5 mg total) by mouth at bedtime as needed for anxiety or sleep. 12/28/21  Yes Abernathy, Yetta Flock, NP  ?amLODipine (NORVASC) 10 MG tablet Take 1 tablet (10 mg total) by mouth daily. 12/28/21  Yes Abernathy, Yetta Flock, NP  ?amphetamine-dextroamphetamine (ADDERALL) 10 MG tablet Take 1 tablet (10 mg total) by mouth daily as needed. 12/28/21  Yes Abernathy, Yetta Flock, NP  ?amphetamine-dextroamphetamine (ADDERALL) 10 MG tablet Take 1 tablet (10 mg total) by mouth daily as needed. 01/25/22  Yes Abernathy, Yetta Flock, NP   ?amphetamine-dextroamphetamine (ADDERALL) 10 MG tablet Take 1 tablet (10 mg total) by mouth daily as needed. 02/22/22  Yes Abernathy, Yetta Flock, NP  ?amphetamine-dextroamphetamine (ADDERALL) 30 MG tablet Take 1 tablet by mouth daily. 12/28/21  Yes Abernathy, Yetta Flock, NP  ?amphetamine-dextroamphetamine (ADDERALL) 30 MG tablet Take 1 tablet by mouth daily. 01/25/22  Yes Abernathy, Yetta Flock, NP  ?amphetamine-dextroamphetamine (ADDERALL) 30 MG tablet Take 1 tablet by mouth daily. 02/22/22  Yes Jonetta Osgood, NP  ?Daridorexant HCl (QUVIVIQ) 50 MG TABS Take 50 mg by mouth at bedtime. 12/28/21  Yes Abernathy, Yetta Flock, NP  ?valACYclovir (VALTREX) 500 MG tablet TAKE 1 TABLET BY MOUTH TWICE A DAY 11/16/21  Yes Jonetta Osgood, NP  ? ? ?Family History  ?Problem Relation Age of Onset  ? Lung cancer Father   ? Diabetes Sister   ? Breast cancer Neg Hx   ?  ? ?Social History  ? ?Tobacco Use  ? Smoking status: Never  ? Smokeless tobacco: Never  ?Vaping Use  ? Vaping Use: Never used  ?Substance Use Topics  ? Alcohol use: Yes  ?  Comment: social  ? Drug use: Not Currently  ?  Types: Marijuana  ?  Comment: once in a while   ? ? ?Allergies as of 12/30/2021 - Review Complete 12/30/2021  ?Allergen Reaction Noted  ? Codeine Other (See Comments) 12/11/2013  ?  Morphine Hives 04/03/2015  ? Morphine and related Hives 12/11/2013  ? Vicodin [hydrocodone-acetaminophen] Other (See Comments) 12/11/2013  ? Nickel Itching and Rash 04/03/2015  ? ? ?Review of Systems:    ?All systems reviewed and negative except where noted in HPI. ? ? Physical Exam:  ?There were no vitals taken for this visit. ?No LMP recorded. Patient is postmenopausal. ?General:   Alert,  Well-developed, well-nourished, pleasant and cooperative in NAD ?Head:  Normocephalic and atraumatic. ?Eyes:  Sclera clear, no icterus.   Conjunctiva pink. ?Ears:  Normal auditory acuity. ?Neck:  Supple; no masses or thyromegaly. ?Lungs:  Respirations even and unlabored.  Clear throughout to  auscultation.   No wheezes, crackles, or rhonchi. No acute distress. ?Heart:  Regular rate and rhythm; no murmurs, clicks, rubs, or gallops. ?Abdomen:  Normal bowel sounds.  No bruits.  Soft, non-tender and non-distended without masses, hepatosplenomegaly or hernias noted.  No guarding or rebound tenderness.  Negative Carnett sign.   ?Rectal:  Deferred.  ?Pulses:  Normal pulses noted. ?Extremities:  No clubbing or edema.  No cyanosis. ?Neurologic:  Alert and oriented x3;  grossly normal neurologically. ?Skin:  Intact without significant lesions or rashes.  No jaundice. ?Lymph Nodes:  No significant cervical adenopathy. ?Psych:  Alert and cooperative. Normal mood and affect. ? ?Imaging Studies: ?No results found. ? ?Assessment and Plan:  ? ?Megan [name] is a 62 y.o. y/o female who comes in today with a history of dysphagia that may be related to esophageal spasms.  The patient is not having any heartburn at present time.  The patient will be set up for a EGD to look for a possible stricture as the cause of her symptoms are the patient has been told that if there are signs of reflux that she may need to be on a PPI and see if her symptoms improve.  If it is esophageal spasm she may need medication for that.  The patient would also like to be set up for colonoscopy at the same time of the EGD for screening.  The patient has been explained the plan and agrees with it. ? ? ? ?Lucilla Lame, MD. [facility] ? ? ? Note: This dictation was prepared with Dragon dictation along with smaller phrase technology. Any transcriptional errors that result from this process are unintentional.   ?

## 2022-01-05 ENCOUNTER — Encounter: Payer: Self-pay | Admitting: Gastroenterology

## 2022-01-10 ENCOUNTER — Other Ambulatory Visit: Payer: Self-pay

## 2022-01-10 ENCOUNTER — Encounter
Admission: RE | Disposition: A | Discharge status: Home / Self Care | Payer: Self-pay | Source: Ambulatory Visit | Attending: Gastroenterology

## 2022-01-10 ENCOUNTER — Ambulatory Visit
Admission: RE | Admit: 2022-01-10 | Discharge: 2022-01-10 | Disposition: A | Discharge status: Home / Self Care | Payer: 59 | Source: Ambulatory Visit | Attending: Gastroenterology | Admitting: Gastroenterology

## 2022-01-10 ENCOUNTER — Ambulatory Visit: Payer: 59 | Admitting: Anesthesiology

## 2022-01-10 ENCOUNTER — Encounter: Payer: Self-pay | Admitting: Gastroenterology

## 2022-01-10 DIAGNOSIS — K5140 Inflammatory polyps of colon without complications: Secondary | ICD-10-CM | POA: Diagnosis not present

## 2022-01-10 DIAGNOSIS — K449 Diaphragmatic hernia without obstruction or gangrene: Secondary | ICD-10-CM | POA: Diagnosis not present

## 2022-01-10 DIAGNOSIS — F419 Anxiety disorder, unspecified: Secondary | ICD-10-CM | POA: Diagnosis not present

## 2022-01-10 DIAGNOSIS — Z1211 Encounter for screening for malignant neoplasm of colon: Secondary | ICD-10-CM

## 2022-01-10 DIAGNOSIS — M1990 Unspecified osteoarthritis, unspecified site: Secondary | ICD-10-CM | POA: Insufficient documentation

## 2022-01-10 DIAGNOSIS — K222 Esophageal obstruction: Secondary | ICD-10-CM

## 2022-01-10 DIAGNOSIS — I10 Essential (primary) hypertension: Secondary | ICD-10-CM | POA: Insufficient documentation

## 2022-01-10 DIAGNOSIS — R1310 Dysphagia, unspecified: Secondary | ICD-10-CM | POA: Diagnosis not present

## 2022-01-10 DIAGNOSIS — K635 Polyp of colon: Secondary | ICD-10-CM

## 2022-01-10 DIAGNOSIS — R1319 Other dysphagia: Secondary | ICD-10-CM

## 2022-01-10 DIAGNOSIS — K5730 Diverticulosis of large intestine without perforation or abscess without bleeding: Secondary | ICD-10-CM | POA: Diagnosis not present

## 2022-01-10 DIAGNOSIS — F988 Other specified behavioral and emotional disorders with onset usually occurring in childhood and adolescence: Secondary | ICD-10-CM | POA: Insufficient documentation

## 2022-01-10 DIAGNOSIS — K2950 Unspecified chronic gastritis without bleeding: Secondary | ICD-10-CM | POA: Diagnosis not present

## 2022-01-10 DIAGNOSIS — K641 Second degree hemorrhoids: Secondary | ICD-10-CM | POA: Insufficient documentation

## 2022-01-10 DIAGNOSIS — D123 Benign neoplasm of transverse colon: Secondary | ICD-10-CM | POA: Diagnosis not present

## 2022-01-10 DIAGNOSIS — B9681 Helicobacter pylori [H. pylori] as the cause of diseases classified elsewhere: Secondary | ICD-10-CM | POA: Diagnosis not present

## 2022-01-10 DIAGNOSIS — R69 Illness, unspecified: Secondary | ICD-10-CM | POA: Diagnosis not present

## 2022-01-10 HISTORY — DX: Motion sickness, initial encounter: T75.3XXA

## 2022-01-10 HISTORY — PX: ESOPHAGOGASTRODUODENOSCOPY: SHX5428

## 2022-01-10 HISTORY — DX: Family history of other specified conditions: Z84.89

## 2022-01-10 HISTORY — DX: Unspecified osteoarthritis, unspecified site: M19.90

## 2022-01-10 HISTORY — PX: COLONOSCOPY: SHX5424

## 2022-01-10 HISTORY — DX: Presence of spectacles and contact lenses: Z97.3

## 2022-01-10 SURGERY — COLONOSCOPY
Anesthesia: General | Site: Rectum

## 2022-01-10 MED ORDER — PROPOFOL 10 MG/ML IV BOLUS
INTRAVENOUS | Status: DC | PRN
  Administered 2022-01-10: 100 mg via INTRAVENOUS
  Administered 2022-01-10 (×5): 50 mg via INTRAVENOUS

## 2022-01-10 MED ORDER — SODIUM CHLORIDE 0.9 % IV SOLN: INTRAVENOUS | Status: DC

## 2022-01-10 MED ORDER — ACETAMINOPHEN 325 MG PO TABS: 650.0000 mg | ORAL_TABLET | ORAL | Status: DC | PRN

## 2022-01-10 MED ORDER — ONDANSETRON HCL 4 MG/2ML IJ SOLN: 4.0000 mg | Freq: Once | INTRAMUSCULAR | Status: DC | PRN

## 2022-01-10 MED ORDER — GLYCOPYRROLATE 0.2 MG/ML IJ SOLN
INTRAMUSCULAR | Status: DC | PRN
Start: 2022-01-10 — End: 2022-01-10
  Administered 2022-01-10: .2 mg via INTRAVENOUS

## 2022-01-10 MED ORDER — LIDOCAINE HCL (CARDIAC) PF 100 MG/5ML IV SOSY
PREFILLED_SYRINGE | INTRAVENOUS | Status: DC | PRN
  Administered 2022-01-10: 30 mg via INTRAVENOUS

## 2022-01-10 MED ORDER — STERILE WATER FOR IRRIGATION IR SOLN
Status: DC | PRN
Start: 2022-01-10 — End: 2022-01-10
  Administered 2022-01-10: 250 mL

## 2022-01-10 MED ORDER — LACTATED RINGERS IV SOLN: INTRAVENOUS | Status: DC

## 2022-01-10 MED ORDER — ACETAMINOPHEN 160 MG/5ML PO SOLN: 325.0000 mg | ORAL | Status: DC | PRN

## 2022-01-10 SURGICAL SUPPLY — 38 items
BALLN DILATOR 10-12 8 (BALLOONS)
BALLN DILATOR 12-15 8 (BALLOONS)
BALLN DILATOR 15-18 8 (BALLOONS) ×4
BALLN DILATOR CRE 0-12 8 (BALLOONS)
BALLN DILATOR ESOPH 8 10 CRE (MISCELLANEOUS) IMPLANT
BALLOON DILATOR 12-15 8 (BALLOONS) IMPLANT
BALLOON DILATOR 15-18 8 (BALLOONS) IMPLANT
BALLOON DILATOR CRE 0-12 8 (BALLOONS) IMPLANT
BLOCK BITE 60FR ADLT L/F GRN (MISCELLANEOUS) ×4 IMPLANT
CLIP HMST 235XBRD CATH ROT (MISCELLANEOUS) IMPLANT
CLIP RESOLUTION 360 11X235 (MISCELLANEOUS)
ELECT REM PT RETURN 9FT ADLT (ELECTROSURGICAL)
ELECTRODE REM PT RTRN 9FT ADLT (ELECTROSURGICAL) IMPLANT
FCP ESCP3.2XJMB 240X2.8X (MISCELLANEOUS)
FORCEPS BIOP RAD 4 LRG CAP 4 (CUTTING FORCEPS) ×1 IMPLANT
FORCEPS BIOP RJ4 240 W/NDL (MISCELLANEOUS)
FORCEPS ESCP3.2XJMB 240X2.8X (MISCELLANEOUS) IMPLANT
GOWN CVR UNV OPN BCK APRN NK (MISCELLANEOUS) ×12 IMPLANT
GOWN ISOL THUMB LOOP REG UNIV (MISCELLANEOUS) ×16
INJECTOR VARIJECT VIN23 (MISCELLANEOUS) IMPLANT
KIT DEFENDO VALVE AND CONN (KITS) IMPLANT
KIT PRC NS LF DISP ENDO (KITS) ×6 IMPLANT
KIT PROCEDURE OLYMPUS (KITS) ×8
MANIFOLD NEPTUNE II (INSTRUMENTS) ×8 IMPLANT
MARKER SPOT ENDO TATTOO 5ML (MISCELLANEOUS) IMPLANT
PROBE APC STR FIRE (PROBE) IMPLANT
RETRIEVER NET PLAT FOOD (MISCELLANEOUS) IMPLANT
RETRIEVER NET ROTH 2.5X230 LF (MISCELLANEOUS) IMPLANT
SNARE COLD EXACTO (MISCELLANEOUS) ×1 IMPLANT
SNARE SHORT THROW 13M SML OVAL (MISCELLANEOUS) IMPLANT
SNARE SHORT THROW 30M LRG OVAL (MISCELLANEOUS) IMPLANT
SNARE SNG USE RND 15MM (INSTRUMENTS) IMPLANT
SPOT EX ENDOSCOPIC TATTOO (MISCELLANEOUS)
SYR INFLATION 60ML (SYRINGE) ×1 IMPLANT
TRAP ETRAP POLY (MISCELLANEOUS) ×1 IMPLANT
VARIJECT INJECTOR VIN23 (MISCELLANEOUS)
WATER STERILE IRR 250ML POUR (IV SOLUTION) ×8 IMPLANT
WIRE CRE 18-20MM 8CM F G (MISCELLANEOUS) IMPLANT

## 2022-01-10 NOTE — Anesthesia Preprocedure Evaluation (Signed)
Anesthesia Evaluation  ?Patient identified by MRN, date of birth, ID band ?Patient awake ? ? ? ?Reviewed: ?Allergy & Precautions, NPO status , Patient's Chart, lab work & pertinent test results, reviewed documented beta blocker date and time  ? ?History of Anesthesia Complications ?Negative for: history of anesthetic complications ? ?Airway ?Mallampati: II ? ?TM Distance: >3 FB ?Neck ROM: Full ? ? ? Dental ?  ?Pulmonary ? ?  ?breath sounds clear to auscultation ? ? ? ? ? ? Cardiovascular ?hypertension, (-) angina(-) DOE + dysrhythmias Supra Ventricular Tachycardia  ?Rhythm:Regular Rate:Normal ? ? ?  ?Neuro/Psych ?PSYCHIATRIC DISORDERS (ADD) Anxiety   ? GI/Hepatic ?neg GERD  ,  ?Endo/Other  ? ? Renal/GU ?  ? ?  ?Musculoskeletal ? ?(+) Arthritis ,  ? Abdominal ?(+) + obese (BMI 38),   ?Peds ? Hematology ?  ?Anesthesia Other Findings ? ? Reproductive/Obstetrics ? ?  ? ? ? ? ? ? ? ? ? ? ? ? ? ?  ?  ? ? ? ? ? ? ? ?Anesthesia Physical ?Anesthesia Plan ? ?ASA: 2 ? ?Anesthesia Plan: General  ? ?Post-op Pain Management:   ? ?Induction: Intravenous ? ?PONV Risk Score and Plan: 3 and Propofol infusion, TIVA and Treatment may vary due to age or medical condition ? ?Airway Management Planned: Natural Airway and Nasal Cannula ? ?Additional Equipment:  ? ?Intra-op Plan:  ? ?Post-operative Plan:  ? ?Informed Consent: I have reviewed the patients History and Physical, chart, labs and discussed the procedure including the risks, benefits and alternatives for the proposed anesthesia with the patient or authorized representative who has indicated his/her understanding and acceptance.  ? ? ? ? ? ?Plan Discussed with: CRNA and Anesthesiologist ? ?Anesthesia Plan Comments:   ? ? ? ? ? ?Anesthesia Quick Evaluation ? ?

## 2022-01-10 NOTE — Interval H&P Note (Signed)
? ?Lucilla Lame, MD [facility] ?[address] ?[place], [place] [address] ?Phone:[phone] ?Fax : [phone] ? ?Primary Care Physician:  Jonetta Osgood, NP ?Primary Gastroenterologist:  Dr. Allen Norris ? ?Pre-Procedure History & Physical: ?HPI:  Megan [name] is a 61 y.o. female is here for an endoscopy and colonoscopy. ?  ?Past Medical History:  ?Diagnosis Date  ? ADD (attention deficit disorder)   ? Arthritis   ? feet  ? Family history of adverse reaction to anesthesia   ? Sister - MI during knee surgery  ? Motion sickness   ? Sea Sick  ? Pre-hypertension   ? Wears contact lenses   ? ? ?Past Surgical History:  ?Procedure Laterality Date  ? BREAST BIOPSY Left 2002  ? benign  ? BUNIONECTOMY Right   ? CESAREAN SECTION    ? ? ?Prior to Admission medications   ?Medication Sig Start Date End Date Taking? Authorizing Provider  ?ALPRAZolam (XANAX) 0.5 MG tablet Take 1 tablet (0.5 mg total) by mouth at bedtime as needed for anxiety or sleep. 12/28/21  Yes Abernathy, Yetta Flock, NP  ?amLODipine (NORVASC) 10 MG tablet Take 1 tablet (10 mg total) by mouth daily. 12/28/21  Yes Abernathy, Yetta Flock, NP  ?amphetamine-dextroamphetamine (ADDERALL) 10 MG tablet Take 1 tablet (10 mg total) by mouth daily as needed. 12/28/21  Yes Abernathy, Yetta Flock, NP  ?amphetamine-dextroamphetamine (ADDERALL) 30 MG tablet Take 1 tablet by mouth daily. 12/28/21  Yes Jonetta Osgood, NP  ?Daridorexant HCl (QUVIVIQ) 50 MG TABS Take 50 mg by mouth at bedtime. 12/28/21  Yes Abernathy, Yetta Flock, NP  ?TAURINE PO Take by mouth.   Yes [provider]  ?valACYclovir (VALTREX) 500 MG tablet TAKE 1 TABLET BY MOUTH TWICE A DAY 11/16/21  Yes Abernathy, Alyssa, NP  ?amphetamine-dextroamphetamine (ADDERALL) 10 MG tablet Take 1 tablet (10 mg total) by mouth daily as needed. 01/25/22   Jonetta Osgood, NP  ?amphetamine-dextroamphetamine (ADDERALL) 10 MG tablet Take 1 tablet (10 mg total) by mouth daily as needed. 02/22/22   Jonetta Osgood, NP   ?amphetamine-dextroamphetamine (ADDERALL) 30 MG tablet Take 1 tablet by mouth daily. 01/25/22   Jonetta Osgood, NP  ?amphetamine-dextroamphetamine (ADDERALL) 30 MG tablet Take 1 tablet by mouth daily. 02/22/22   Jonetta Osgood, NP  ? ? ?Allergies as of 12/30/2021 - Review Complete 12/30/2021  ?Allergen Reaction Noted  ? Codeine Other (See Comments) 12/11/2013  ? Morphine Hives 04/03/2015  ? Morphine and related Hives 12/11/2013  ? Vicodin [hydrocodone-acetaminophen] Other (See Comments) 12/11/2013  ? Nickel Itching and Rash 04/03/2015  ? ? ?Family History  ?Problem Relation Age of Onset  ? Lung cancer Father   ? Diabetes Sister   ? Breast cancer Neg Hx   ? ? ?Social History  ? ?Socioeconomic History  ? Marital status: Married  ?  Spouse name: Not on file  ? Number of children: Not on file  ? Years of education: Not on file  ? Highest education level: Not on file  ?Occupational History  ? Not on file  ?Tobacco Use  ? Smoking status: Never  ? Smokeless tobacco: Never  ?Vaping Use  ? Vaping Use: Never used  ?Substance and Sexual Activity  ? Alcohol use: Yes  ?  Comment: social  ? Drug use: Not Currently  ?  Types: Marijuana  ?  Comment: once in a while   ? Sexual activity: Not on file  ?Other Topics Concern  ? Not on file  ?Social History Narrative  ? Not on file  ? ?  Social Determinants of Health  ? ?Financial Resource Strain: Not on file  ?Food Insecurity: Not on file  ?Transportation Needs: Not on file  ?Physical Activity: Not on file  ?Stress: Not on file  ?Social Connections: Not on file  ?Intimate Partner Violence: Not on file  ? ? ?Review of Systems: ?See HPI, otherwise negative ROS ? ?Physical Exam: ?Ht '4\' 10"'$  (1.473 m)   Wt 81.6 kg   BMI 37.62 kg/m?  ?General:   Alert,  pleasant and cooperative in NAD ?Head:  Normocephalic and atraumatic. ?Neck:  Supple; no masses or thyromegaly. ?Lungs:  Clear throughout to auscultation.    ?Heart:  Regular rate and rhythm. ?Abdomen:  Soft, nontender and nondistended.  Normal bowel sounds, without guarding, and without rebound.   ?Neurologic:  Alert and  oriented x4;  grossly normal neurologically. ? ?Impression/Plan: ?Megan [name] is here for an endoscopy and colonoscopy to be performed for dysphagia and screening ? ?Risks, benefits, limitations, and alternatives regarding  endoscopy and colonoscopy have been reviewed with the patient.  Questions have been answered.  All parties agreeable. ? ? ?Lucilla Lame, MD  01/10/2022, 10:10 AM ?

## 2022-01-10 NOTE — Op Note (Signed)
[facility] ?Gastroenterology ?Patient Name: Megan [name] ?Procedure Date: 01/10/2022 10:15 AM ?MRN: [id number] ?Account #: [id number] ?Date of Birth: 1961-03-26 ?Admit Type: Outpatient ?Age: 61 ?Room: [facility] OR ROOM 01 ?Gender: Female ?Note Status: Finalized ?Instrument Name: [id number] ?Procedure:             Upper GI endoscopy ?Indications:           Dysphagia ?Providers:             Lucilla Lame MD, MD ?Referring MD:          Jonetta Osgood (Referring MD) ?Medicines:             Propofol per Anesthesia ?Complications:         No immediate complications. ?Procedure:             Pre-Anesthesia Assessment: ?                       - Prior to the procedure, a History and Physical was  ?                       performed, and patient medications and allergies were  ?                       reviewed. The patient's tolerance of previous  ?                       anesthesia was also reviewed. The risks and benefits  ?                       of the procedure and the sedation options and risks  ?                       were discussed with the patient. All questions were  ?                       answered, and informed consent was obtained. Prior  ?                       Anticoagulants: The patient has taken no previous  ?                       anticoagulant or antiplatelet agents. ASA Grade  ?                       Assessment: II - A patient with mild systemic disease.  ?                       After reviewing the risks and benefits, the patient  ?                       was deemed in satisfactory condition to undergo the  ?                       procedure. ?                       After obtaining informed consent, the endoscope was  ?  passed under direct vision. Throughout the procedure,  ?                       the patient's blood pressure, pulse, and oxygen  ?                       saturations were monitored continuously. The was  ?                       introduced through the mouth, and  advanced to the  ?                       second part of duodenum. The upper GI endoscopy was  ?                       accomplished without difficulty. The patient tolerated  ?                       the procedure well. ?Findings: ?     A small hiatal hernia was present. ?     One benign-appearing, intrinsic mild stenosis was found at the  ?     gastroesophageal junction. The stenosis was traversed. A TTS dilator was  ?     passed through the scope. Dilation with a 15-16.5-18 mm balloon dilator  ?     was performed to 18 mm. The dilation site was examined following  ?     endoscope reinsertion and showed complete resolution of luminal  ?     narrowing. ?     Localized mild inflammation characterized by erythema was found in the  ?     gastric antrum. Biopsies were taken with a cold forceps for histology. ?     The examined duodenum was normal. ?     Biopsies were obtained with cold forceps for histology in the middle  ?     third of the esophagus. ?Impression:            - Small hiatal hernia. ?                       - Benign-appearing esophageal stenosis. Dilated. ?                       - Gastritis. Biopsied. ?                       - Normal examined duodenum. ?                       - Biopsy performed in the middle third of the  ?                       esophagus. ?Recommendation:        - Discharge patient to home. ?                       - Resume previous diet. ?                       - Continue present medications. ?                       -  Perform a colonoscopy today. ?Procedure Code(s):     --- Professional --- ?                       [phone], Esophagogastroduodenoscopy, flexible,  ?                       transoral; with transendoscopic balloon dilation of  ?                       esophagus (less than 30 mm diameter) ?                       [id number], 59, Esophagogastroduodenoscopy, flexible,  ?                       transoral; with biopsy, single or multiple ?Diagnosis Code(s):     --- Professional --- ?                        R13.10, Dysphagia, unspecified ?                       K29.70, Gastritis, unspecified, without bleeding ?                       K22.2, Esophageal obstruction ?CPT copyright 2019 American Medical Association. All rights reserved. ?The codes documented in this report are preliminary and upon coder review may  ?be revised to meet current compliance requirements. ?Lucilla Lame MD, MD ?01/10/2022 10:39:42 AM ?This report has been signed electronically. ?Number of Addenda: 0 ?Note Initiated On: 01/10/2022 10:15 AM ?Total Procedure Duration: 0 hours 4 minutes 48 seconds  ?Estimated Blood Loss:  Estimated blood loss: none. ?     [facility] ?

## 2022-01-10 NOTE — Transfer of Care (Signed)
Immediate Anesthesia Transfer of Care Note ? ?Patient: Megan [name] ? ?Procedure(s) Performed: COLONOSCOPY (Rectum) ?ESOPHAGOGASTRODUODENOSCOPY (EGD) (Mouth) ?Balloon dilation wire-guided (Mouth) ? ?Patient Location: PACU ? ?Anesthesia Type: General ? ?Level of Consciousness: awake, alert  and patient cooperative ? ?Airway and Oxygen Therapy: Patient Spontanous Breathing and Patient connected to supplemental oxygen ? ?Post-op Assessment: Post-op Vital signs reviewed, Patient's Cardiovascular Status Stable, Respiratory Function Stable, Patent Airway and No signs of Nausea or vomiting ? ?Post-op Vital Signs: Reviewed and stable ? ?Complications: No notable events documented. ? ?

## 2022-01-10 NOTE — Anesthesia Postprocedure Evaluation (Signed)
Anesthesia Post Note ? ?Patient: Megan [name] ? ?Procedure(s) Performed: COLONOSCOPY (Rectum) ?ESOPHAGOGASTRODUODENOSCOPY (EGD) (Mouth) ?Balloon dilation wire-guided (Mouth) ? ? ?  ?Patient location during evaluation: PACU ?Anesthesia Type: General ?Level of consciousness: awake and alert ?Pain management: pain level controlled ?Vital Signs Assessment: post-procedure vital signs reviewed and stable ?Respiratory status: spontaneous breathing, nonlabored ventilation, respiratory function stable and patient connected to nasal cannula oxygen ?Cardiovascular status: blood pressure returned to baseline and stable ?Postop Assessment: no apparent nausea or vomiting ?Anesthetic complications: no ? ? ?No notable events documented. ? ?Uzma Hellmer A  [employer] ? ? ? ? ? ?

## 2022-01-10 NOTE — Anesthesia Procedure Notes (Signed)
Procedure Name: Stuart ?Date/Time: 01/10/2022 10:31 AM ?Performed by: Georga Bora, CRNA ?Pre-anesthesia Checklist: Patient identified, Emergency Drugs available, Suction available, Patient being monitored and Timeout performed ?Patient Re-evaluated:Patient Re-evaluated prior to induction ?Oxygen Delivery Method: Nasal cannula ?Placement Confirmation: positive ETCO2 and breath sounds checked- equal and bilateral ? ? ? ? ?

## 2022-01-10 NOTE — Op Note (Signed)
[facility] ?Gastroenterology ?Patient Name: Megan [name] ?Procedure Date: 01/10/2022 10:12 AM ?MRN: [id number] ?Account #: [id number] ?Date of Birth: 11-14-1960 ?Admit Type: Outpatient ?Age: 61 ?Room: [facility] OR ROOM 01 ?Gender: Female ?Note Status: Finalized ?Instrument Name: [id number] ?Procedure:             Colonoscopy ?Indications:           Screening for colorectal malignant neoplasm ?Providers:             Lucilla Lame MD, MD ?Referring MD:          Jonetta Osgood (Referring MD) ?Medicines:             Propofol per Anesthesia ?Complications:         No immediate complications. ?Procedure:             Pre-Anesthesia Assessment: ?                       - Prior to the procedure, a History and Physical was  ?                       performed, and patient medications and allergies were  ?                       reviewed. The patient's tolerance of previous  ?                       anesthesia was also reviewed. The risks and benefits  ?                       of the procedure and the sedation options and risks  ?                       were discussed with the patient. All questions were  ?                       answered, and informed consent was obtained. Prior  ?                       Anticoagulants: The patient has taken no previous  ?                       anticoagulant or antiplatelet agents. ASA Grade  ?                       Assessment: II - A patient with mild systemic disease.  ?                       After reviewing the risks and benefits, the patient  ?                       was deemed in satisfactory condition to undergo the  ?                       procedure. ?                       After obtaining informed consent, the colonoscope was  ?  passed under direct vision. Throughout the procedure,  ?                       the patient's blood pressure, pulse, and oxygen  ?                       saturations were monitored continuously. The  ?                       Colonoscope  was introduced through the anus and  ?                       advanced to the the cecum, identified by appendiceal  ?                       orifice and ileocecal valve. The colonoscopy was  ?                       performed without difficulty. The patient tolerated  ?                       the procedure well. The quality of the bowel  ?                       preparation was excellent. ?Findings: ?     The perianal and digital rectal examinations were normal. ?     A 5 mm polyp was found in the transverse colon. The polyp was sessile.  ?     The polyp was removed with a cold snare. Resection and retrieval were  ?     complete. ?     Multiple small-mouthed diverticula were found in the sigmoid colon. ?     Non-bleeding internal hemorrhoids were found during retroflexion. The  ?     hemorrhoids were Grade II (internal hemorrhoids that prolapse but reduce  ?     spontaneously). ?Impression:            - One 5 mm polyp in the transverse colon, removed with  ?                       a cold snare. Resected and retrieved. ?                       - Diverticulosis in the sigmoid colon. ?                       - Non-bleeding internal hemorrhoids. ?Recommendation:        - Discharge patient to home. ?                       - Resume previous diet. ?                       - Continue present medications. ?                       - Await pathology results. ?                       - If the pathology report reveals adenomatous tissue,  ?  then repeat the colonoscopy for surveillance in 7  ?                       years. ?Procedure Code(s):     --- Professional --- ?                       [phone], Colonoscopy, flexible; with removal of  ?                       tumor(s), polyp(s), or other lesion(s) by snare  ?                       technique ?Diagnosis Code(s):     --- Professional --- ?                       Z12.11, Encounter for screening for malignant neoplasm  ?                       of colon ?                        K63.5, Polyp of colon ?CPT copyright 2019 American Medical Association. All rights reserved. ?The codes documented in this report are preliminary and upon coder review may  ?be revised to meet current compliance requirements. ?Lucilla Lame MD, MD ?01/10/2022 10:53:36 AM ?This report has been signed electronically. ?Number of Addenda: 0 ?Note Initiated On: 01/10/2022 10:12 AM ?Scope Withdrawal Time: 0 hours 6 minutes 52 seconds  ?Total Procedure Duration: 0 hours 8 minutes 50 seconds  ?Estimated Blood Loss:  Estimated blood loss: none. ?     [facility] ?

## 2022-01-11 ENCOUNTER — Encounter: Payer: Self-pay | Admitting: Gastroenterology

## 2022-01-11 LAB — SURGICAL PATHOLOGY

## 2022-01-14 ENCOUNTER — Other Ambulatory Visit: Payer: Self-pay

## 2022-01-14 DIAGNOSIS — Z8619 Personal history of other infectious and parasitic diseases: Secondary | ICD-10-CM

## 2022-01-14 MED ORDER — AMOXICILLIN 500 MG PO CAPS: 1000.0000 mg | ORAL_CAPSULE | Freq: Two times a day (BID) | ORAL | 0 refills | Status: DC

## 2022-01-14 MED ORDER — CLARITHROMYCIN 500 MG PO TABS: 500.0000 mg | ORAL_TABLET | Freq: Two times a day (BID) | ORAL | 0 refills | Status: DC

## 2022-01-14 MED ORDER — PANTOPRAZOLE SODIUM 20 MG PO TBEC: 20.0000 mg | DELAYED_RELEASE_TABLET | Freq: Two times a day (BID) | ORAL | 0 refills | Status: DC

## 2022-01-14 NOTE — Addendum Note (Signed)
Addended by: Lurlean Nanny on: 01/14/2022 11:38 AM ? ? Modules accepted: Orders ? ?

## 2022-01-21 ENCOUNTER — Other Ambulatory Visit: Payer: Self-pay | Admitting: Nurse Practitioner

## 2022-01-21 DIAGNOSIS — B009 Herpesviral infection, unspecified: Secondary | ICD-10-CM

## 2022-01-27 MED ORDER — FLUCONAZOLE 150 MG PO TABS: 150.0000 mg | ORAL_TABLET | Freq: Once | ORAL | 0 refills | Status: AC

## 2022-01-27 NOTE — Addendum Note (Signed)
Addended by: Lurlean Nanny on: 01/27/2022 12:07 PM   Modules accepted: Orders

## 2022-02-09 DIAGNOSIS — J3489 Other specified disorders of nose and nasal sinuses: Secondary | ICD-10-CM | POA: Diagnosis not present

## 2022-03-07 ENCOUNTER — Telehealth: Payer: Self-pay

## 2022-03-07 DIAGNOSIS — Z8619 Personal history of other infectious and parasitic diseases: Secondary | ICD-10-CM

## 2022-03-09 NOTE — Telephone Encounter (Signed)
Left message on voicemail.

## 2022-03-14 NOTE — Telephone Encounter (Signed)
Left message on voicemail.

## 2022-03-16 DIAGNOSIS — M170 Bilateral primary osteoarthritis of knee: Secondary | ICD-10-CM | POA: Diagnosis not present

## 2022-03-22 ENCOUNTER — Encounter: Payer: Self-pay | Admitting: Internal Medicine

## 2022-03-22 ENCOUNTER — Ambulatory Visit: Payer: 59 | Admitting: Internal Medicine

## 2022-03-22 VITALS — BP 140/93 | HR 93 | Temp 98.0°F | Resp 16 | Ht <= 58 in | Wt 181.4 lb

## 2022-03-22 DIAGNOSIS — E6609 Other obesity due to excess calories: Secondary | ICD-10-CM | POA: Diagnosis not present

## 2022-03-22 DIAGNOSIS — Z79899 Other long term (current) drug therapy: Secondary | ICD-10-CM

## 2022-03-22 DIAGNOSIS — E782 Mixed hyperlipidemia: Secondary | ICD-10-CM | POA: Diagnosis not present

## 2022-03-22 DIAGNOSIS — F988 Other specified behavioral and emotional disorders with onset usually occurring in childhood and adolescence: Secondary | ICD-10-CM

## 2022-03-22 DIAGNOSIS — F5101 Primary insomnia: Secondary | ICD-10-CM

## 2022-03-22 DIAGNOSIS — I10 Essential (primary) hypertension: Secondary | ICD-10-CM

## 2022-03-22 DIAGNOSIS — Z1239 Encounter for other screening for malignant neoplasm of breast: Secondary | ICD-10-CM

## 2022-03-22 DIAGNOSIS — Z6839 Body mass index (BMI) 39.0-39.9, adult: Secondary | ICD-10-CM

## 2022-03-22 DIAGNOSIS — R69 Illness, unspecified: Secondary | ICD-10-CM | POA: Diagnosis not present

## 2022-03-22 LAB — POCT URINE DRUG SCREEN
Methylenedioxyamphetamine: NOT DETECTED
POC Amphetamine UR: POSITIVE — AB
POC BENZODIAZEPINES UR: NOT DETECTED
POC Barbiturate UR: NOT DETECTED
POC Cocaine UR: NOT DETECTED
POC Ecstasy UR: NOT DETECTED
POC Marijuana UR: NOT DETECTED
POC Methadone UR: NOT DETECTED
POC Methamphetamine UR: NOT DETECTED
POC Opiate Ur: NOT DETECTED
POC Oxycodone UR: NOT DETECTED
POC PHENCYCLIDINE UR: NOT DETECTED
POC TRICYCLICS UR: NOT DETECTED

## 2022-03-22 MED ORDER — AMPHETAMINE-DEXTROAMPHETAMINE 10 MG PO TABS: 10.0000 mg | ORAL_TABLET | Freq: Every day | ORAL | 0 refills | Status: DC | PRN

## 2022-03-22 MED ORDER — AMPHETAMINE-DEXTROAMPHETAMINE 30 MG PO TABS: 30.0000 mg | ORAL_TABLET | Freq: Every day | ORAL | 0 refills | Status: DC

## 2022-03-22 NOTE — Progress Notes (Signed)
[facility] [place], [place] [address]  Internal MEDICINE  Office Visit Note  Patient Name: Megan [name]  [id number]  [id number]  Date of Service: 03/22/2022  Chief Complaint  Patient presents with   Follow-up    HPI Pt is here for routine follow up, she has been out of her Norvasc, was in [place] and ran out. Is here to refill her Adderall RX for attention and focus. Keeps her at task. Sleeps well with Quviviq, has been off of Alprazolam  Denies any c/p or sob   Current Medication: Outpatient Encounter Medications as of 03/22/2022  Medication Sig   ALPRAZolam (XANAX) 0.5 MG tablet Take 1 tablet (0.5 mg total) by mouth at bedtime as needed for anxiety or sleep.   amLODipine (NORVASC) 10 MG tablet Take 1 tablet (10 mg total) by mouth daily.   clarithromycin (BIAXIN) 500 MG tablet Take 1 tablet (500 mg total) by mouth 2 (two) times daily.   Daridorexant HCl (QUVIVIQ) 50 MG TABS Take 50 mg by mouth at bedtime.   pantoprazole (PROTONIX) 20 MG tablet Take 1 tablet (20 mg total) by mouth 2 (two) times daily.   TAURINE PO Take by mouth.   valACYclovir (VALTREX) 500 MG tablet TAKE 1 TABLET BY MOUTH TWICE A DAY   [DISCONTINUED] amoxicillin (AMOXIL) 500 MG capsule Take 2 capsules (1,000 mg total) by mouth 2 (two) times daily.   [DISCONTINUED] amphetamine-dextroamphetamine (ADDERALL) 10 MG tablet Take 1 tablet (10 mg total) by mouth daily as needed.   [DISCONTINUED] amphetamine-dextroamphetamine (ADDERALL) 10 MG tablet Take 1 tablet (10 mg total) by mouth daily as needed.   [DISCONTINUED] amphetamine-dextroamphetamine (ADDERALL) 10 MG tablet Take 1 tablet (10 mg total) by mouth daily as needed.   [DISCONTINUED] amphetamine-dextroamphetamine (ADDERALL) 30 MG tablet Take 1 tablet by mouth daily.   [DISCONTINUED] amphetamine-dextroamphetamine (ADDERALL) 30 MG tablet Take 1 tablet by mouth daily.   [DISCONTINUED] amphetamine-dextroamphetamine (ADDERALL) 30 MG tablet  Take 1 tablet by mouth daily.   amphetamine-dextroamphetamine (ADDERALL) 10 MG tablet Take 1 tablet (10 mg total) by mouth daily as needed.   amphetamine-dextroamphetamine (ADDERALL) 30 MG tablet Take 1 tablet by mouth daily.   [DISCONTINUED] amphetamine-dextroamphetamine (ADDERALL) 10 MG tablet Take 1 tablet (10 mg total) by mouth daily as needed.   No facility-administered encounter medications on file as of 03/22/2022.    Surgical History: Past Surgical History:  Procedure Laterality Date   BREAST BIOPSY Left 2002   benign   BUNIONECTOMY Right    CESAREAN SECTION     COLONOSCOPY N/A 01/10/2022   Procedure: COLONOSCOPY;  Surgeon: Lucilla Lame, MD;  Location: [place];  Service: Endoscopy;  Laterality: N/A;   ESOPHAGOGASTRODUODENOSCOPY N/A 01/10/2022   Procedure: ESOPHAGOGASTRODUODENOSCOPY (EGD);  Surgeon: Lucilla Lame, MD;  Location: [place];  Service: Endoscopy;  Laterality: N/A;  Latex    Medical History: Past Medical History:  Diagnosis Date   ADD (attention deficit disorder)    Arthritis    feet   Family history of adverse reaction to anesthesia    Sister - MI during knee surgery   Motion sickness    Sea Sick   Pre-hypertension    Wears contact lenses     Family History: Family History  Problem Relation Age of Onset   Lung cancer Father    Diabetes Sister    Breast cancer Neg Hx     Social History   Socioeconomic History   Marital status: Married    Spouse  name: Not on file   Number of children: Not on file   Years of education: Not on file   Highest education level: Not on file  Occupational History   Not on file  Tobacco Use   Smoking status: Never   Smokeless tobacco: Never  Vaping Use   Vaping Use: Never used  Substance and Sexual Activity   Alcohol use: Yes    Comment: social   Drug use: Not Currently    Types: Marijuana    Comment: once in a while    Sexual activity: Not on file  Other Topics Concern   Not on file   Social History Narrative   Not on file   Social Determinants of Health   Financial Resource Strain: Not on file  Food Insecurity: Not on file  Transportation Needs: Not on file  Physical Activity: Not on file  Stress: Not on file  Social Connections: Not on file  Intimate Partner Violence: Not on file      Review of Systems  Constitutional:  Negative for chills, fatigue and unexpected weight change.  HENT:  Negative for congestion, postnasal drip, rhinorrhea, sneezing and sore throat.   Eyes:  Negative for redness.  Respiratory:  Negative for cough, chest tightness and shortness of breath.   Cardiovascular:  Negative for chest pain and palpitations.  Gastrointestinal:  Negative for abdominal pain, constipation, diarrhea, nausea and vomiting.  Genitourinary:  Negative for dysuria and frequency.  Musculoskeletal:  Negative for arthralgias, back pain, joint swelling and neck pain.  Skin:  Negative for rash.  Neurological: Negative.  Negative for tremors and numbness.  Hematological:  Negative for adenopathy. Does not bruise/bleed easily.  Psychiatric/Behavioral:  Negative for behavioral problems (Depression), sleep disturbance and suicidal ideas. The patient is not nervous/anxious.     Vital Signs: BP (!) 140/93   Pulse 93   Temp 98 F (36.7 C)   Resp 16   Ht '4\' 10"'$  (1.473 m)   Wt 181 lb 6.4 oz (82.3 kg)   SpO2 98%   BMI 37.91 kg/m    Physical Exam Constitutional:      Appearance: Normal appearance.  HENT:     Head: Normocephalic and atraumatic.     Nose: Nose normal.     Mouth/Throat:     Mouth: Mucous membranes are moist.     Pharynx: No posterior oropharyngeal erythema.  Eyes:     Extraocular Movements: Extraocular movements intact.     Pupils: Pupils are equal, round, and reactive to light.  Cardiovascular:     Pulses: Normal pulses.     Heart sounds: Normal heart sounds.  Pulmonary:     Effort: Pulmonary effort is normal.     Breath sounds: Normal  breath sounds.  Neurological:     General: No focal deficit present.     Mental Status: She is alert.  Psychiatric:        Mood and Affect: Mood normal.        Behavior: Behavior normal.     Assessment/Plan: 1. Essential hypertension Slightly elevated. She has been without her medicine ( norvasc ), will refill  - Comprehensive metabolic panel  2. Primary insomnia Continue Quviviq as before  - CBC with Differential/Platelet  3. Attention deficit disorder (ADD) without hyperactivity Pt is given 90 day supply for both refills.  - TSH - POCT Urine Drug Screen - amphetamine-dextroamphetamine (ADDERALL) 30 MG tablet; Take 1 tablet by mouth daily.  Dispense: 90 tablet; Refill: 0 - amphetamine-dextroamphetamine (  ADDERALL) 10 MG tablet; Take 1 tablet (10 mg total) by mouth daily as needed.  Dispense: 90 tablet; Refill: 0  4. Class 2 obesity due to excess calories without serious comorbidity with body mass index (BMI) of 39.0 to 39.9 in adult Encouraged wt loss  - TSH - T4, free - Comprehensive metabolic panel  5. Mixed hyperlipidemia - Lipid Panel With LDL/HDL Ratio  6. Breast screening - MM 3D SCREEN BREAST BILATERAL; Future  7. Encounter for long-term (current) use of medications - POCT Urine Drug Screen   General Counseling: Tylasia verbalizes understanding of the findings of todays visit and agrees with plan of treatment. I have discussed any further diagnostic evaluation that may be needed or ordered today. We also reviewed her medications today. she has been encouraged to call the office with any questions or concerns that should arise related to todays visit.    Orders Placed This Encounter  Procedures   MM 3D SCREEN BREAST BILATERAL   CBC with Differential/Platelet   Lipid Panel With LDL/HDL Ratio   TSH   T4, free   Comprehensive metabolic panel   POCT Urine Drug Screen    Meds ordered this encounter  Medications   DISCONTD: amphetamine-dextroamphetamine  (ADDERALL) 10 MG tablet    Sig: Take 1 tablet (10 mg total) by mouth daily as needed.    Dispense:  90 tablet    Refill:  0   amphetamine-dextroamphetamine (ADDERALL) 30 MG tablet    Sig: Take 1 tablet by mouth daily.    Dispense:  90 tablet    Refill:  0   amphetamine-dextroamphetamine (ADDERALL) 10 MG tablet    Sig: Take 1 tablet (10 mg total) by mouth daily as needed.    Dispense:  90 tablet    Refill:  0    Total time spent:35 Minutes Time spent includes review of chart, medications, test results, and follow up plan with the patient.   [place] Controlled Substance Database was reviewed by me.   Dr Lavera Guise Internal medicine

## 2022-03-23 DIAGNOSIS — Z6839 Body mass index (BMI) 39.0-39.9, adult: Secondary | ICD-10-CM | POA: Diagnosis not present

## 2022-03-23 DIAGNOSIS — Z8619 Personal history of other infectious and parasitic diseases: Secondary | ICD-10-CM | POA: Diagnosis not present

## 2022-03-23 DIAGNOSIS — R69 Illness, unspecified: Secondary | ICD-10-CM | POA: Diagnosis not present

## 2022-03-23 DIAGNOSIS — E6609 Other obesity due to excess calories: Secondary | ICD-10-CM | POA: Diagnosis not present

## 2022-03-23 DIAGNOSIS — I10 Essential (primary) hypertension: Secondary | ICD-10-CM | POA: Diagnosis not present

## 2022-03-23 DIAGNOSIS — E782 Mixed hyperlipidemia: Secondary | ICD-10-CM | POA: Diagnosis not present

## 2022-03-24 ENCOUNTER — Telehealth: Payer: Self-pay

## 2022-03-24 LAB — T4, FREE: Free T4: 1.32 ng/dL (ref 0.82–1.77)

## 2022-03-24 LAB — COMPREHENSIVE METABOLIC PANEL
ALT: 25 IU/L (ref 0–32)
AST: 19 IU/L (ref 0–40)
Albumin/Globulin Ratio: 1.8 (ref 1.2–2.2)
Albumin: 4.5 g/dL (ref 3.9–4.9)
Alkaline Phosphatase: 87 IU/L (ref 44–121)
BUN/Creatinine Ratio: 28 (ref 12–28)
BUN: 19 mg/dL (ref 8–27)
Bilirubin Total: 0.7 mg/dL (ref 0.0–1.2)
CO2: 23 mmol/L (ref 20–29)
Calcium: 9.8 mg/dL (ref 8.7–10.3)
Chloride: 100 mmol/L (ref 96–106)
Creatinine, Ser: 0.69 mg/dL (ref 0.57–1.00)
Globulin, Total: 2.5 g/dL (ref 1.5–4.5)
Glucose: 104 mg/dL — ABNORMAL HIGH (ref 70–99)
Potassium: 5.4 mmol/L — ABNORMAL HIGH (ref 3.5–5.2)
Sodium: 138 mmol/L (ref 134–144)
Total Protein: 7 g/dL (ref 6.0–8.5)
eGFR: 97 mL/min/{1.73_m2} (ref >59)

## 2022-03-24 LAB — CBC WITH DIFFERENTIAL/PLATELET
Basophils Absolute: 0.1 10*3/uL (ref 0.0–0.2)
Basos: 1 %
EOS (ABSOLUTE): 0.1 10*3/uL (ref 0.0–0.4)
Eos: 1 %
Hematocrit: 49.9 % — ABNORMAL HIGH (ref 34.0–46.6)
Hemoglobin: 16.1 g/dL — ABNORMAL HIGH (ref 11.1–15.9)
Immature Grans (Abs): 0.1 10*3/uL (ref 0.0–0.1)
Immature Granulocytes: 1 %
Lymphocytes Absolute: 1.6 10*3/uL (ref 0.7–3.1)
Lymphs: 21 %
MCH: 27.2 pg (ref 26.6–33.0)
MCHC: 32.3 g/dL (ref 31.5–35.7)
MCV: 84 fL (ref 79–97)
Monocytes Absolute: 0.6 10*3/uL (ref 0.1–0.9)
Monocytes: 8 %
Neutrophils Absolute: 5.3 10*3/uL (ref 1.4–7.0)
Neutrophils: 68 %
Platelets: 333 10*3/uL (ref 150–450)
RBC: 5.92 x10E6/uL — ABNORMAL HIGH (ref 3.77–5.28)
RDW: 12.7 % (ref 11.7–15.4)
WBC: 7.7 10*3/uL (ref 3.4–10.8)

## 2022-03-24 LAB — LIPID PANEL WITH LDL/HDL RATIO
Cholesterol, Total: 260 mg/dL — ABNORMAL HIGH (ref 100–199)
HDL: 74 mg/dL (ref >39)
LDL Chol Calc (NIH): 174 mg/dL — ABNORMAL HIGH (ref 0–99)
LDL/HDL Ratio: 2.4 ratio (ref 0.0–3.2)
Triglycerides: 74 mg/dL (ref 0–149)
VLDL Cholesterol Cal: 12 mg/dL (ref 5–40)

## 2022-03-24 LAB — TSH: TSH: 2.92 u[IU]/mL (ref 0.450–4.500)

## 2022-03-24 NOTE — Progress Notes (Signed)
Please add on Ferritin level

## 2022-03-24 NOTE — Telephone Encounter (Signed)
Called [facility] and add on ferritin

## 2022-03-25 LAB — H. PYLORI BREATH TEST: H pylori Breath Test: NEGATIVE

## 2022-03-26 LAB — FERRITIN

## 2022-03-26 LAB — SPECIMEN STATUS REPORT

## 2022-04-12 NOTE — Addendum Note (Signed)
Addended by: Lurlean Nanny on: 04/12/2022 04:43 PM   Modules accepted: Orders

## 2022-05-24 ENCOUNTER — Telehealth: Payer: Self-pay | Admitting: Internal Medicine

## 2022-05-24 DIAGNOSIS — M9901 Segmental and somatic dysfunction of cervical region: Secondary | ICD-10-CM | POA: Diagnosis not present

## 2022-05-24 DIAGNOSIS — R519 Headache, unspecified: Secondary | ICD-10-CM | POA: Diagnosis not present

## 2022-05-24 DIAGNOSIS — M546 Pain in thoracic spine: Secondary | ICD-10-CM | POA: Diagnosis not present

## 2022-05-24 DIAGNOSIS — M9902 Segmental and somatic dysfunction of thoracic region: Secondary | ICD-10-CM | POA: Diagnosis not present

## 2022-05-24 NOTE — Telephone Encounter (Signed)
Lvm to schedule acute visit-Megan [name]

## 2022-05-25 DIAGNOSIS — M9902 Segmental and somatic dysfunction of thoracic region: Secondary | ICD-10-CM | POA: Diagnosis not present

## 2022-05-25 DIAGNOSIS — R519 Headache, unspecified: Secondary | ICD-10-CM | POA: Diagnosis not present

## 2022-05-25 DIAGNOSIS — M546 Pain in thoracic spine: Secondary | ICD-10-CM | POA: Diagnosis not present

## 2022-05-25 DIAGNOSIS — M9901 Segmental and somatic dysfunction of cervical region: Secondary | ICD-10-CM | POA: Diagnosis not present

## 2022-05-31 ENCOUNTER — Other Ambulatory Visit: Payer: Self-pay | Admitting: Nurse Practitioner

## 2022-05-31 ENCOUNTER — Telehealth: Payer: Self-pay

## 2022-05-31 DIAGNOSIS — F988 Other specified behavioral and emotional disorders with onset usually occurring in childhood and adolescence: Secondary | ICD-10-CM

## 2022-05-31 MED ORDER — AMPHETAMINE-DEXTROAMPHETAMINE 30 MG PO TABS: 30.0000 mg | ORAL_TABLET | Freq: Every day | ORAL | 0 refills | Status: DC

## 2022-05-31 NOTE — Telephone Encounter (Signed)
Lmom that we send med °

## 2022-05-31 NOTE — Telephone Encounter (Signed)
done

## 2022-06-01 DIAGNOSIS — R519 Headache, unspecified: Secondary | ICD-10-CM | POA: Diagnosis not present

## 2022-06-01 DIAGNOSIS — M546 Pain in thoracic spine: Secondary | ICD-10-CM | POA: Diagnosis not present

## 2022-06-01 DIAGNOSIS — M9901 Segmental and somatic dysfunction of cervical region: Secondary | ICD-10-CM | POA: Diagnosis not present

## 2022-06-01 DIAGNOSIS — M9902 Segmental and somatic dysfunction of thoracic region: Secondary | ICD-10-CM | POA: Diagnosis not present

## 2022-06-07 ENCOUNTER — Telehealth (INDEPENDENT_AMBULATORY_CARE_PROVIDER_SITE_OTHER): Payer: Self-pay

## 2022-06-07 DIAGNOSIS — R519 Headache, unspecified: Secondary | ICD-10-CM | POA: Diagnosis not present

## 2022-06-07 DIAGNOSIS — M9902 Segmental and somatic dysfunction of thoracic region: Secondary | ICD-10-CM | POA: Diagnosis not present

## 2022-06-07 DIAGNOSIS — M9901 Segmental and somatic dysfunction of cervical region: Secondary | ICD-10-CM | POA: Diagnosis not present

## 2022-06-07 DIAGNOSIS — M546 Pain in thoracic spine: Secondary | ICD-10-CM | POA: Diagnosis not present

## 2022-06-07 NOTE — Telephone Encounter (Signed)
LVM for pt in regards to her VMLOM. She stated in VM that she needed to make an appt. I do not see a referral in Epic, proficient or faxed. We will need the referral before scheduling.

## 2022-06-15 ENCOUNTER — Telehealth: Payer: Self-pay | Admitting: Internal Medicine

## 2022-06-15 NOTE — Telephone Encounter (Signed)
Left vm to confirm 06/20/22 appointment-Toni

## 2022-06-20 ENCOUNTER — Other Ambulatory Visit: Payer: 59 | Admitting: Internal Medicine

## 2022-06-27 ENCOUNTER — Other Ambulatory Visit: Payer: Self-pay | Admitting: Nurse Practitioner

## 2022-06-28 ENCOUNTER — Encounter: Payer: Self-pay | Admitting: Internal Medicine

## 2022-06-28 ENCOUNTER — Ambulatory Visit (INDEPENDENT_AMBULATORY_CARE_PROVIDER_SITE_OTHER): Payer: 59 | Admitting: Internal Medicine

## 2022-06-28 VITALS — BP 127/79 | HR 93 | Temp 97.9°F | Resp 16 | Ht <= 58 in | Wt 182.8 lb

## 2022-06-28 DIAGNOSIS — Z23 Encounter for immunization: Secondary | ICD-10-CM | POA: Diagnosis not present

## 2022-06-28 DIAGNOSIS — I10 Essential (primary) hypertension: Secondary | ICD-10-CM

## 2022-06-28 DIAGNOSIS — Z0001 Encounter for general adult medical examination with abnormal findings: Secondary | ICD-10-CM | POA: Diagnosis not present

## 2022-06-28 DIAGNOSIS — E782 Mixed hyperlipidemia: Secondary | ICD-10-CM

## 2022-06-28 DIAGNOSIS — F988 Other specified behavioral and emotional disorders with onset usually occurring in childhood and adolescence: Secondary | ICD-10-CM

## 2022-06-28 DIAGNOSIS — Z1231 Encounter for screening mammogram for malignant neoplasm of breast: Secondary | ICD-10-CM

## 2022-06-28 DIAGNOSIS — R300 Dysuria: Secondary | ICD-10-CM

## 2022-06-28 DIAGNOSIS — R233 Spontaneous ecchymoses: Secondary | ICD-10-CM

## 2022-06-28 DIAGNOSIS — F5101 Primary insomnia: Secondary | ICD-10-CM | POA: Diagnosis not present

## 2022-06-28 DIAGNOSIS — F411 Generalized anxiety disorder: Secondary | ICD-10-CM

## 2022-06-28 MED ORDER — AMLODIPINE BESYLATE 10 MG PO TABS: 10.0000 mg | ORAL_TABLET | Freq: Every day | ORAL | 3 refills | Status: DC

## 2022-06-28 MED ORDER — QUVIVIQ 50 MG PO TABS: 50.0000 mg | ORAL_TABLET | Freq: Every day | ORAL | 0 refills | Status: DC

## 2022-06-28 MED ORDER — ALPRAZOLAM 0.5 MG PO TABS: 0.5000 mg | ORAL_TABLET | Freq: Every evening | ORAL | 0 refills | Status: DC | PRN

## 2022-06-28 MED ORDER — QUVIVIQ 50 MG PO TABS: 50.0000 mg | ORAL_TABLET | Freq: Every day | ORAL | 2 refills | Status: DC

## 2022-06-28 MED ORDER — AMPHETAMINE-DEXTROAMPHETAMINE 10 MG PO TABS: 10.0000 mg | ORAL_TABLET | Freq: Every day | ORAL | 0 refills | Status: DC | PRN

## 2022-06-28 MED ORDER — AMPHETAMINE-DEXTROAMPHETAMINE 30 MG PO TABS: 30.0000 mg | ORAL_TABLET | Freq: Every day | ORAL | 0 refills | Status: DC

## 2022-06-28 NOTE — Progress Notes (Signed)
[facility] [place], [place] [address]  Internal MEDICINE  Office Visit Note  Patient Name: Megan [name]  [id number]  [id number]  Date of Service: 06/29/2022  Chief Complaint  Patient presents with   Annual Exam   ADD   Alopecia    Patient having significant hair loss since having Covid 2 months ago, especially when showering/brushing hair   Rash    Has rash that pops up here and there, appears on both arms     HPI Pt is here for routine health maintenance examination C/o hair loss post Covid Rash over her forearms, has been on prednisone  Sleeps better Quviviq however still has a problem maintaining her sleep Recent labs showed worsening lipid profile,  Continues to take Adderall for ADD, has done self assessment scoring in the past   Current Medication: Outpatient Encounter Medications as of 06/28/2022  Medication Sig   pantoprazole (PROTONIX) 20 MG tablet Take 1 tablet (20 mg total) by mouth 2 (two) times daily.   TAURINE PO Take by mouth.   valACYclovir (VALTREX) 500 MG tablet TAKE 1 TABLET BY MOUTH TWICE A DAY   [DISCONTINUED] ALPRAZolam (XANAX) 0.5 MG tablet Take 1 tablet (0.5 mg total) by mouth at bedtime as needed for anxiety or sleep.   [DISCONTINUED] amLODipine (NORVASC) 10 MG tablet Take 1 tablet (10 mg total) by mouth daily.   [DISCONTINUED] amphetamine-dextroamphetamine (ADDERALL) 10 MG tablet Take 1 tablet (10 mg total) by mouth daily as needed.   [DISCONTINUED] amphetamine-dextroamphetamine (ADDERALL) 30 MG tablet Take 1 tablet by mouth daily.   [DISCONTINUED] clarithromycin (BIAXIN) 500 MG tablet Take 1 tablet (500 mg total) by mouth 2 (two) times daily.   [DISCONTINUED] Daridorexant HCl (QUVIVIQ) 50 MG TABS Take 50 mg by mouth at bedtime.   ALPRAZolam (XANAX) 0.5 MG tablet Take 1 tablet (0.5 mg total) by mouth at bedtime as needed for anxiety or sleep.   amLODipine (NORVASC) 10 MG tablet Take 1 tablet (10 mg total) by mouth  daily.   amphetamine-dextroamphetamine (ADDERALL) 10 MG tablet Take 1 tablet (10 mg total) by mouth daily as needed.   amphetamine-dextroamphetamine (ADDERALL) 30 MG tablet Take 1 tablet by mouth daily.   Daridorexant HCl (QUVIVIQ) 50 MG TABS Take 50 mg by mouth at bedtime.   [DISCONTINUED] Daridorexant HCl (QUVIVIQ) 50 MG TABS Take 50 mg by mouth at bedtime.   [DISCONTINUED] Daridorexant HCl (QUVIVIQ) 50 MG TABS Take 50 mg by mouth at bedtime.   No facility-administered encounter medications on file as of 06/28/2022.    Surgical History: Past Surgical History:  Procedure Laterality Date   BREAST BIOPSY Left 2002   benign   BUNIONECTOMY Right    CESAREAN SECTION     COLONOSCOPY N/A 01/10/2022   Procedure: COLONOSCOPY;  Surgeon: Lucilla Lame, MD;  Location: [place];  Service: Endoscopy;  Laterality: N/A;   ESOPHAGOGASTRODUODENOSCOPY N/A 01/10/2022   Procedure: ESOPHAGOGASTRODUODENOSCOPY (EGD);  Surgeon: Lucilla Lame, MD;  Location: [place];  Service: Endoscopy;  Laterality: N/A;  Latex    Medical History: Past Medical History:  Diagnosis Date   ADD (attention deficit disorder)    Arthritis    feet   Family history of adverse reaction to anesthesia    Sister - MI during knee surgery   Motion sickness    Sea Sick   Pre-hypertension    Wears contact lenses     Family History: Family History  Problem Relation Age of Onset   Lung  cancer Father    Diabetes Sister    Breast cancer Neg Hx     Social History: Social History   Socioeconomic History   Marital status: Married    Spouse name: Not on file   Number of children: Not on file   Years of education: Not on file   Highest education level: Not on file  Occupational History   Not on file  Tobacco Use   Smoking status: Never   Smokeless tobacco: Never  Vaping Use   Vaping Use: Never used  Substance and Sexual Activity   Alcohol use: Yes    Comment: social   Drug use: Not Currently    Types:  Marijuana    Comment: once in a while    Sexual activity: Not on file  Other Topics Concern   Not on file  Social History Narrative   Not on file   Social Determinants of Health   Financial Resource Strain: Not on file  Food Insecurity: Not on file  Transportation Needs: Not on file  Physical Activity: Not on file  Stress: Not on file  Social Connections: Not on file      Review of Systems   Vital Signs: BP 127/79   Pulse 93   Temp 97.9 F (36.6 C)   Resp 16   Ht '4\' 10"'$  (1.473 m)   Wt 182 lb 12.8 oz (82.9 kg)   SpO2 99%   BMI 38.21 kg/m    Physical Exam Constitutional:      General: She is not in acute distress.    Appearance: She is well-developed. She is not diaphoretic.  HENT:     Head: Normocephalic and atraumatic.     Mouth/Throat:     Pharynx: No oropharyngeal exudate.  Eyes:     Pupils: Pupils are equal, round, and reactive to light.  Neck:     Thyroid: No thyromegaly.     Vascular: No JVD.     Trachea: No tracheal deviation.  Cardiovascular:     Rate and Rhythm: Normal rate and regular rhythm.     Heart sounds: Normal heart sounds. No murmur heard.    No friction rub. No gallop.  Pulmonary:     Effort: Pulmonary effort is normal. No respiratory distress.     Breath sounds: No wheezing or rales.  Chest:     Chest wall: No tenderness.  Abdominal:     General: Bowel sounds are normal.     Palpations: Abdomen is soft.  Musculoskeletal:        General: Normal range of motion.     Cervical back: Normal range of motion and neck supple.  Lymphadenopathy:     Cervical: No cervical adenopathy.  Skin:    General: Skin is warm and dry.  Neurological:     Mental Status: She is alert and oriented to person, place, and time.     Cranial Nerves: No cranial nerve deficit.  Psychiatric:        Behavior: Behavior normal.        Thought Content: Thought content normal.        Judgment: Judgment normal.        Assessment/Plan: 1. Encounter for  general adult medical examination with abnormal findings All PHM is updated   2. Encounter for mammogram to establish baseline mammogram - MM 3D SCREEN BREAST BILATERAL; Future  3. Benign hypertension BP is well controlled, normal echo in past 2 years  - amLODipine (NORVASC) 10 MG tablet;  Take 1 tablet (10 mg total) by mouth daily.  Dispense: 90 tablet; Refill: 3  4. Attention deficit disorder (ADD) without hyperactivity Refilled. PDMP reviewed, pt is compliant with med intake and refills   - amphetamine-dextroamphetamine (ADDERALL) 10 MG tablet; Take 1 tablet (10 mg total) by mouth daily as needed.  Dispense: 90 tablet; Refill: 0 - amphetamine-dextroamphetamine (ADDERALL) 30 MG tablet; Take 1 tablet by mouth daily.  Dispense: 90 tablet; Refill: 0  5. Primary insomnia Continues to have problem maintaining her sleep, might need to see sleep specialist  - ALPRAZolam (XANAX) 0.5 MG tablet; Take 1 tablet (0.5 mg total) by mouth at bedtime as needed for anxiety or sleep.  Dispense: 30 tablet; Refill: 0 - Daridorexant HCl (QUVIVIQ) 50 MG TABS; Take 50 mg by mouth at bedtime.  Dispense: 90 tablet; Refill: 0  6. GAD (generalized anxiety disorder) - ALPRAZolam (XANAX) 0.5 MG tablet; Take 1 tablet (0.5 mg total) by mouth at bedtime as needed for anxiety or sleep.  Dispense: 30 tablet; Refill: 0  8. Mixed hyperlipidemia She is not sure if this is a fasting sample, will repeat  - Lipid Panel With LDL/HDL Ratio  9. Petechiae Most likely due to prednisone intake  - CBC with Differential/Platelet  10. Flu vaccine need - Flu Vaccine MDCK QUAD PF   General Counseling: Onnika verbalizes understanding of the findings of todays visit and agrees with plan of treatment. I have discussed any further diagnostic evaluation that may be needed or ordered today. We also reviewed her medications today. she has been encouraged to call the office with any questions or concerns that should arise related to todays  visit.    Counseling:  [place] Controlled Substance Database was reviewed by me.  Orders Placed This Encounter  Procedures   MM 3D SCREEN BREAST BILATERAL   Flu Vaccine MDCK QUAD PF   UA/M w/rflx Culture, Routine   Lipid Panel With LDL/HDL Ratio   CBC with Differential/Platelet    Meds ordered this encounter  Medications   amphetamine-dextroamphetamine (ADDERALL) 10 MG tablet    Sig: Take 1 tablet (10 mg total) by mouth daily as needed.    Dispense:  90 tablet    Refill:  0   amphetamine-dextroamphetamine (ADDERALL) 30 MG tablet    Sig: Take 1 tablet by mouth daily.    Dispense:  90 tablet    Refill:  0   ALPRAZolam (XANAX) 0.5 MG tablet    Sig: Take 1 tablet (0.5 mg total) by mouth at bedtime as needed for anxiety or sleep.    Dispense:  30 tablet    Refill:  0   DISCONTD: Daridorexant HCl (QUVIVIQ) 50 MG TABS    Sig: Take 50 mg by mouth at bedtime.    Dispense:  30 tablet    Refill:  2   amLODipine (NORVASC) 10 MG tablet    Sig: Take 1 tablet (10 mg total) by mouth daily.    Dispense:  90 tablet    Refill:  3   DISCONTD: Daridorexant HCl (QUVIVIQ) 50 MG TABS    Sig: Take 50 mg by mouth at bedtime.    Dispense:  30 tablet    Refill:  2   Daridorexant HCl (QUVIVIQ) 50 MG TABS    Sig: Take 50 mg by mouth at bedtime.    Dispense:  90 tablet    Refill:  0    Total time spent:35 Minutes  Time spent includes review of chart, medications, test results, and  follow up plan with the patient.     Lavera Guise, MD  Internal Medicine

## 2022-06-30 ENCOUNTER — Telehealth: Payer: Self-pay

## 2022-06-30 ENCOUNTER — Other Ambulatory Visit: Payer: Self-pay

## 2022-06-30 NOTE — Telephone Encounter (Signed)
Lmom to call us back 

## 2022-07-05 ENCOUNTER — Ambulatory Visit: Payer: 59 | Admitting: Nurse Practitioner

## 2022-07-11 ENCOUNTER — Encounter (INDEPENDENT_AMBULATORY_CARE_PROVIDER_SITE_OTHER): Payer: Self-pay

## 2022-07-20 ENCOUNTER — Other Ambulatory Visit: Payer: Self-pay | Admitting: Nurse Practitioner

## 2022-07-20 DIAGNOSIS — B009 Herpesviral infection, unspecified: Secondary | ICD-10-CM

## 2022-07-21 DIAGNOSIS — S83232A Complex tear of medial meniscus, current injury, left knee, initial encounter: Secondary | ICD-10-CM | POA: Diagnosis not present

## 2022-07-21 DIAGNOSIS — M25561 Pain in right knee: Secondary | ICD-10-CM | POA: Diagnosis not present

## 2022-07-21 DIAGNOSIS — G8929 Other chronic pain: Secondary | ICD-10-CM | POA: Diagnosis not present

## 2022-08-10 ENCOUNTER — Telehealth: Payer: Self-pay

## 2022-08-10 ENCOUNTER — Other Ambulatory Visit: Payer: Self-pay | Admitting: Nurse Practitioner

## 2022-08-10 DIAGNOSIS — F988 Other specified behavioral and emotional disorders with onset usually occurring in childhood and adolescence: Secondary | ICD-10-CM

## 2022-08-10 MED ORDER — AMPHETAMINE-DEXTROAMPHETAMINE 30 MG PO TABS: 30.0000 mg | ORAL_TABLET | Freq: Every day | ORAL | 0 refills | Status: DC

## 2022-08-10 MED ORDER — AMPHETAMINE-DEXTROAMPHETAMINE 10 MG PO TABS: 10.0000 mg | ORAL_TABLET | Freq: Every day | ORAL | 0 refills | Status: DC | PRN

## 2022-08-10 NOTE — Telephone Encounter (Signed)
Pt called that pres send for 90 day they only gave her 30 days alyssa send pres to her phar pt aware

## 2022-08-11 ENCOUNTER — Ambulatory Visit (HOSPITAL_COMMUNITY)
Admission: EM | Admit: 2022-08-11 | Discharge: 2022-08-11 | Disposition: A | Discharge status: Home / Self Care | Payer: 59 | Attending: Sports Medicine | Admitting: Sports Medicine

## 2022-08-11 ENCOUNTER — Ambulatory Visit (INDEPENDENT_AMBULATORY_CARE_PROVIDER_SITE_OTHER): Payer: 59

## 2022-08-11 ENCOUNTER — Encounter (HOSPITAL_COMMUNITY): Payer: Self-pay | Admitting: Emergency Medicine

## 2022-08-11 DIAGNOSIS — R0781 Pleurodynia: Secondary | ICD-10-CM | POA: Diagnosis not present

## 2022-08-11 DIAGNOSIS — W19XXXA Unspecified fall, initial encounter: Secondary | ICD-10-CM | POA: Diagnosis not present

## 2022-08-11 DIAGNOSIS — R0782 Intercostal pain: Secondary | ICD-10-CM | POA: Diagnosis not present

## 2022-08-11 MED ORDER — MELOXICAM 15 MG PO TABS: 15.0000 mg | ORAL_TABLET | Freq: Every day | ORAL | 0 refills | Status: DC

## 2022-08-11 MED ORDER — LIDOCAINE 5 % EX PTCH: 1.0000 | MEDICATED_PATCH | CUTANEOUS | 0 refills | Status: DC

## 2022-08-11 NOTE — Discharge Instructions (Signed)
Your x-ray did not show any rib fracture.  I have sent a refill of your meloxicam to the pharmacy as well as some lidocaine patches that may help with pain.  Recommend follow-up with your primary care provider in about 4 weeks.  Return to the urgent care or with your primary care provider if your pain worsens or does not improve. Would also recommend follow-up on your blood pressure when you see your primary care provider

## 2022-08-11 NOTE — ED Triage Notes (Addendum)
Pt reports falling on Saturday. States she feels like she may have broken some ribs. Has left side rib cage pain and worsens with certain positions and taking deep breaths.  Also endorses bruising on the right wrist. Denies pain

## 2022-08-11 NOTE — ED Provider Notes (Signed)
[place]    CSN: [id number] Arrival date & time: 08/11/22  1045      History   Chief Complaint Chief Complaint  Patient presents with   rib cage pain    HPI Megan [name] is a 61 y.o. female.   She presents today with chief complaint of left-sided chest pain after a fall on Saturday when she landed on some bricks around the fireplace at her house.  She is concerned about a broken rib.  She also has some bruising on her wrist, under her chin right upper chest area.  She denies any pain in her wrist chest or chin.  Her rib pain is worse with inspiration, cough and sneeze.  She states her pain has worsened over the past couple days especially with bending over and rotating.  She feels like something is moving in her chest.  She denies any chest pain or shortness of breath.     Past Medical History:  Diagnosis Date   ADD (attention deficit disorder)    Arthritis    feet   Family history of adverse reaction to anesthesia    Sister - MI during knee surgery   Motion sickness    Sea Sick   Pre-hypertension    Wears contact lenses     Patient Active Problem List   Diagnosis Date Noted   Screening for colon cancer    Polyp of transverse colon    Esophageal dysphagia    Stricture and stenosis of esophagus    Encounter for general adult medical examination with abnormal findings 08/28/2019   Vitamin D deficiency 08/28/2019   Abnormal weight gain 08/28/2019   Encounter for screening mammogram for malignant neoplasm of breast 08/28/2019   Routine cervical smear 08/28/2019   Dysuria 08/28/2019   Essential hypertension 08/13/2019   Hordeolum internum 01/27/2019   Attention deficit disorder (ADD) without hyperactivity 01/27/2019   Sleep disturbance 01/27/2019   History of vitamin D deficiency 02/19/2018   Other insomnia 02/19/2018   Recurrent genital herpes 02/19/2018   Vaginal prolapse 02/19/2018   ADD (attention deficit disorder) without hyperactivity  02/19/2018    Past Surgical History:  Procedure Laterality Date   BREAST BIOPSY Left 2002   benign   BUNIONECTOMY Right    CESAREAN SECTION     COLONOSCOPY N/A 01/10/2022   Procedure: COLONOSCOPY;  Surgeon: Lucilla Lame, MD;  Location: [place];  Service: Endoscopy;  Laterality: N/A;   ESOPHAGOGASTRODUODENOSCOPY N/A 01/10/2022   Procedure: ESOPHAGOGASTRODUODENOSCOPY (EGD);  Surgeon: Lucilla Lame, MD;  Location: [place];  Service: Endoscopy;  Laterality: N/A;  Latex    OB History   No obstetric history on file.      Home Medications    Prior to Admission medications   Medication Sig Start Date End Date Taking? Authorizing Provider  lidocaine (LIDODERM) 5 % Place 1 patch onto the skin daily. Remove & Discard patch within 12 hours or as directed by MD 08/11/22  Yes Rodena Goldmann A, DO  meloxicam (MOBIC) 15 MG tablet Take 1 tablet (15 mg total) by mouth daily. 08/11/22  Yes Elmore Guise, DO  ALPRAZolam (XANAX) 0.5 MG tablet Take 1 tablet (0.5 mg total) by mouth at bedtime as needed for anxiety or sleep. 06/28/22   Lavera Guise, MD  amLODipine (NORVASC) 10 MG tablet Take 1 tablet (10 mg total) by mouth daily. 06/28/22   Lavera Guise, MD  amphetamine-dextroamphetamine (ADDERALL) 10 MG tablet Take 1 tablet (10 mg  total) by mouth daily as needed. 08/10/22   Jonetta Osgood, NP  amphetamine-dextroamphetamine (ADDERALL) 10 MG tablet Take 1 tablet (10 mg total) by mouth daily as needed. 09/07/22   Jonetta Osgood, NP  amphetamine-dextroamphetamine (ADDERALL) 30 MG tablet Take 1 tablet by mouth daily. 08/10/22   Jonetta Osgood, NP  amphetamine-dextroamphetamine (ADDERALL) 30 MG tablet Take 1 tablet by mouth daily. 09/07/22   Jonetta Osgood, NP  Daridorexant HCl (QUVIVIQ) 50 MG TABS Take 50 mg by mouth at bedtime. 06/28/22   Lavera Guise, MD  pantoprazole (PROTONIX) 20 MG tablet Take 1 tablet (20 mg total) by mouth 2 (two) times daily. 01/14/22   Lucilla Lame, MD  TAURINE PO Take by mouth.    [provider]  valACYclovir (VALTREX) 500 MG tablet TAKE 1 TABLET BY MOUTH TWICE A DAY 07/20/22   Jonetta Osgood, NP    Family History Family History  Problem Relation Age of Onset   Lung cancer Father    Diabetes Sister    Breast cancer Neg Hx     Social History Social History   Tobacco Use   Smoking status: Never   Smokeless tobacco: Never  Vaping Use   Vaping Use: Never used  Substance Use Topics   Alcohol use: Yes    Comment: social   Drug use: Not Currently    Types: Marijuana    Comment: once in a while      Allergies   Codeine, Latex, Morphine, Morphine and related, Sunflower oil, Vicodin [hydrocodone-acetaminophen], and Nickel   Review of Systems Review of Systems As listed above in HPI  Physical Exam Triage Vital Signs ED Triage Vitals  Enc Vitals Group     BP 08/11/22 1205 (!) 172/101     Pulse Rate 08/11/22 1205 96     Resp 08/11/22 1205 18     Temp 08/11/22 1205 98.2 F (36.8 C)     Temp Source 08/11/22 1205 Oral     SpO2 08/11/22 1205 97 %     Weight --      Height --      Head Circumference --      Peak Flow --      Pain Score 08/11/22 1204 10     Pain Loc --      Pain Edu? --      Excl. in [place]? --    No data found.  Updated Vital Signs BP (!) 172/101 (BP Location: Right Arm)   Pulse 96   Temp 98.2 F (36.8 C) (Oral)   Resp 18   SpO2 97%   Physical Exam Vitals reviewed.  Constitutional:      General: She is not in acute distress.    Appearance: Normal appearance. She is not ill-appearing, toxic-appearing or diaphoretic.  Cardiovascular:     Rate and Rhythm: Normal rate.  Pulmonary:     Effort: Pulmonary effort is normal. No respiratory distress.     Breath sounds: No stridor. No wheezing, rhonchi or rales.  Musculoskeletal:        General: Tenderness present.     Comments: She has a large area of ecchymosis at her right wrist, right side under her chin and right upper  chest.  There is no ecchymosis on her left flank or under her left breast where she has maximal tenderness to palpation.  Neurological:     Mental Status: She is alert.      UC Treatments / Results  Labs (all labs ordered are  listed, but only abnormal results are displayed) Labs Reviewed - No data to display  EKG   Radiology DG Ribs Unilateral W/Chest Left  Result Date: 08/11/2022 CLINICAL DATA:  Fall, pain EXAM: LEFT RIBS AND CHEST - 3+ VIEW COMPARISON:  02/01/2021 FINDINGS: Calcified granulomas bilaterally. No confluent airspace opacities, effusions or pneumothorax. Heart is normal size. Mediastinal contours within normal limits. No visible displaced rib fracture. IMPRESSION: No acute cardiopulmonary disease. No visible rib fracture. Electronically Signed   By: Rolm Baptise M.D.   On: 08/11/2022 12:36    Procedures Procedures (including critical care time)  Medications Ordered in UC Medications - No data to display  Initial Impression / Assessment and Plan / UC Course  I have reviewed the triage vital signs and the nursing notes.  Pertinent labs & imaging results that were available during my care of the patient were reviewed by me and considered in my medical decision making (see chart for details).     Left-sided rib pain after a fall, rib series ordered, no obvious fracture was seen.  Patient may have a small fracture that was undetected on x-ray however the management would not be any different.  I have sent a refill of meloxicam as an anti-inflammatory that she has had in the past as well as some lidocaine patches to use over the area of maximal tenderness.  Recommend she get a pillow into her chest if she has to cough or sneeze.  She verbalized understanding.  I did let her know that this could take a couple of weeks to heal.  Recommend follow-up with primary care provider in about 4 weeks.  Recommend blood pressure recheck when she follows up with primary care  provider. Final Clinical Impressions(s) / UC Diagnoses   Final diagnoses:  Rib pain     Discharge Instructions      Your x-ray did not show any rib fracture.  I have sent a refill of your meloxicam to the pharmacy as well as some lidocaine patches that may help with pain.  Recommend follow-up with your primary care provider in about 4 weeks.  Return to the urgent care or with your primary care provider if your pain worsens or does not improve. Would also recommend follow-up on your blood pressure when you see your primary care provider     ED Prescriptions     Medication Sig Dispense Auth. Provider   lidocaine (LIDODERM) 5 % Place 1 patch onto the skin daily. Remove & Discard patch within 12 hours or as directed by MD 30 patch Molly Savarino A, DO   meloxicam (MOBIC) 15 MG tablet Take 1 tablet (15 mg total) by mouth daily. 30 tablet Rodena Goldmann A, DO      PDMP not reviewed this encounter.   Rodena Goldmann A, DO 08/11/22 1301

## 2022-08-31 ENCOUNTER — Other Ambulatory Visit: Payer: Self-pay | Admitting: Sports Medicine

## 2022-09-22 ENCOUNTER — Ambulatory Visit (INDEPENDENT_AMBULATORY_CARE_PROVIDER_SITE_OTHER): Payer: BLUE CROSS/BLUE SHIELD | Admitting: Nurse Practitioner

## 2022-09-22 ENCOUNTER — Encounter: Payer: Self-pay | Admitting: Nurse Practitioner

## 2022-09-22 ENCOUNTER — Telehealth: Payer: Self-pay | Admitting: Nurse Practitioner

## 2022-09-22 VITALS — BP 140/85 | HR 95 | Temp 97.7°F | Resp 16 | Ht <= 58 in | Wt 187.2 lb

## 2022-09-22 DIAGNOSIS — E782 Mixed hyperlipidemia: Secondary | ICD-10-CM

## 2022-09-22 DIAGNOSIS — A048 Other specified bacterial intestinal infections: Secondary | ICD-10-CM

## 2022-09-22 DIAGNOSIS — F988 Other specified behavioral and emotional disorders with onset usually occurring in childhood and adolescence: Secondary | ICD-10-CM

## 2022-09-22 DIAGNOSIS — F5101 Primary insomnia: Secondary | ICD-10-CM

## 2022-09-22 DIAGNOSIS — F411 Generalized anxiety disorder: Secondary | ICD-10-CM

## 2022-09-22 DIAGNOSIS — D692 Other nonthrombocytopenic purpura: Secondary | ICD-10-CM | POA: Diagnosis not present

## 2022-09-22 DIAGNOSIS — D751 Secondary polycythemia: Secondary | ICD-10-CM | POA: Diagnosis not present

## 2022-09-22 MED ORDER — AMPHETAMINE-DEXTROAMPHETAMINE 10 MG PO TABS: 10.0000 mg | ORAL_TABLET | Freq: Every day | ORAL | 0 refills | Status: DC | PRN

## 2022-09-22 MED ORDER — AMPHETAMINE-DEXTROAMPHETAMINE 30 MG PO TABS: 30.0000 mg | ORAL_TABLET | Freq: Every day | ORAL | 0 refills | Status: DC

## 2022-09-22 MED ORDER — TRIAMCINOLONE ACETONIDE 55 MCG/ACT NA AERO: 2.0000 | INHALATION_SPRAY | Freq: Every day | NASAL | 5 refills | Status: AC

## 2022-09-22 MED ORDER — ALPRAZOLAM 0.5 MG PO TABS: 0.5000 mg | ORAL_TABLET | Freq: Every evening | ORAL | 0 refills | Status: DC | PRN

## 2022-09-22 NOTE — Telephone Encounter (Signed)
Awaiting 09/22/22 office notes for Hematology referral-Toni

## 2022-09-22 NOTE — Progress Notes (Signed)
[facility] [place], [place] [address]  Internal MEDICINE  Office Visit Note  Patient Name: Megan [name]  [id number]  [id number]  Date of Service: 09/22/2022  Chief Complaint  Patient presents with   Follow-up    HPI Megan [name] presents for a follow-up visit for abnormal labs, bruising on arms and anxiety Persistent erythrocytosis and new onset of purpura on upper extremities Prior H. Pylori infection, wants to repeat breath test Repeat labs including cholesterol Fatigue, low energy -- due for labs ADHD -- BP stable, HR normal. Current adderall dose is effective, denies palpitations and other adverse side effects of the medication.     Current Medication: Outpatient Encounter Medications as of 09/22/2022  Medication Sig   amLODipine (NORVASC) 10 MG tablet Take 1 tablet (10 mg total) by mouth daily.   lidocaine (LIDODERM) 5 % Place 1 patch onto the skin daily. Remove & Discard patch within 12 hours or as directed by MD   meloxicam (MOBIC) 15 MG tablet Take 1 tablet (15 mg total) by mouth daily.   pantoprazole (PROTONIX) 20 MG tablet Take 1 tablet (20 mg total) by mouth 2 (two) times daily.   TAURINE PO Take by mouth.   valACYclovir (VALTREX) 500 MG tablet TAKE 1 TABLET BY MOUTH TWICE A DAY   [DISCONTINUED] ALPRAZolam (XANAX) 0.5 MG tablet Take 1 tablet (0.5 mg total) by mouth at bedtime as needed for anxiety or sleep.   [DISCONTINUED] amphetamine-dextroamphetamine (ADDERALL) 10 MG tablet Take 1 tablet (10 mg total) by mouth daily as needed.   [DISCONTINUED] amphetamine-dextroamphetamine (ADDERALL) 10 MG tablet Take 1 tablet (10 mg total) by mouth daily as needed.   [DISCONTINUED] amphetamine-dextroamphetamine (ADDERALL) 30 MG tablet Take 1 tablet by mouth daily.   [DISCONTINUED] amphetamine-dextroamphetamine (ADDERALL) 30 MG tablet Take 1 tablet by mouth daily.   [DISCONTINUED] Daridorexant HCl (QUVIVIQ) 50 MG TABS Take 50 mg by mouth at bedtime.    ALPRAZolam (XANAX) 0.5 MG tablet Take 1 tablet (0.5 mg total) by mouth at bedtime as needed for anxiety or sleep.   amphetamine-dextroamphetamine (ADDERALL) 10 MG tablet Take 1 tablet (10 mg total) by mouth daily as needed.   [START ON 10/20/2022] amphetamine-dextroamphetamine (ADDERALL) 10 MG tablet Take 1 tablet (10 mg total) by mouth daily as needed.   [START ON 11/17/2022] amphetamine-dextroamphetamine (ADDERALL) 10 MG tablet Take 1 tablet (10 mg total) by mouth daily as needed.   amphetamine-dextroamphetamine (ADDERALL) 30 MG tablet Take 1 tablet by mouth daily.   [START ON 10/20/2022] amphetamine-dextroamphetamine (ADDERALL) 30 MG tablet Take 1 tablet by mouth daily.   [START ON 11/17/2022] amphetamine-dextroamphetamine (ADDERALL) 30 MG tablet Take 1 tablet by mouth daily.   triamcinolone (NASACORT) 55 MCG/ACT AERO nasal inhaler Place 2 sprays into the nose daily.   [DISCONTINUED] triamcinolone (NASACORT) 55 MCG/ACT AERO nasal inhaler 2 sprays daily.   No facility-administered encounter medications on file as of 09/22/2022.    Surgical History: Past Surgical History:  Procedure Laterality Date   BREAST BIOPSY Left 2002   benign   BUNIONECTOMY Right    CESAREAN SECTION     COLONOSCOPY N/A 01/10/2022   Procedure: COLONOSCOPY;  Surgeon: Lucilla Lame, MD;  Location: [facility];  Service: Endoscopy;  Laterality: N/A;   ESOPHAGOGASTRODUODENOSCOPY N/A 01/10/2022   Procedure: ESOPHAGOGASTRODUODENOSCOPY (EGD);  Surgeon: Lucilla Lame, MD;  Location: [place];  Service: Endoscopy;  Laterality: N/A;  Latex    Medical History: Past Medical History:  Diagnosis Date   ADD (attention deficit  disorder)    Arthritis    feet   Family history of adverse reaction to anesthesia    Sister - MI during knee surgery   Motion sickness    Sea Sick   Pre-hypertension    Wears contact lenses     Family History: Family History  Problem Relation Age of Onset   Lung cancer Father    Diabetes  Sister    Breast cancer Neg Hx     Social History   Socioeconomic History   Marital status: Married    Spouse name: Not on file   Number of children: Not on file   Years of education: Not on file   Highest education level: Not on file  Occupational History   Not on file  Tobacco Use   Smoking status: Never   Smokeless tobacco: Never  Vaping Use   Vaping Use: Never used  Substance and Sexual Activity   Alcohol use: Yes    Comment: social   Drug use: Not Currently    Types: Marijuana    Comment: once in a while    Sexual activity: Not on file  Other Topics Concern   Not on file  Social History Narrative   Not on file   Social Determinants of Health   Financial Resource Strain: Not on file  Food Insecurity: Not on file  Transportation Needs: Not on file  Physical Activity: Not on file  Stress: Not on file  Social Connections: Not on file  Intimate Partner Violence: Not on file      Review of Systems  Constitutional:  Positive for appetite change and fatigue.  HENT: Negative.    Respiratory: Negative.  Negative for cough, chest tightness, shortness of breath and wheezing.   Cardiovascular: Negative.  Negative for chest pain and palpitations.  Gastrointestinal:  Positive for abdominal distention, abdominal pain, constipation and nausea. Negative for diarrhea and vomiting.  Neurological:  Positive for dizziness. Negative for headaches.  Psychiatric/Behavioral:  Positive for behavioral problems, dysphoric mood and sleep disturbance.     Vital Signs: BP (!) 140/85 Comment: 154/90  Pulse 95   Temp 97.7 F (36.5 C)   Resp 16   Ht '4\' 10"'$  (1.473 m)   Wt 187 lb 3.2 oz (84.9 kg)   SpO2 93%   BMI 39.12 kg/m    Physical Exam Vitals reviewed.  Constitutional:      Appearance: She is obese. She is ill-appearing.  HENT:     Head: Normocephalic and atraumatic.  Eyes:     Pupils: Pupils are equal, round, and reactive to light.  Cardiovascular:     Rate and  Rhythm: Normal rate and regular rhythm.  Pulmonary:     Effort: Pulmonary effort is normal. No respiratory distress.  Skin:    Findings: Bruising and erythema present.  Neurological:     Mental Status: She is alert and oriented to person, place, and time.  Psychiatric:        Mood and Affect: Mood normal.        Behavior: Behavior normal.        Assessment/Plan: 1. Erythrocytosis Referred to [facility] hematology, repeat labs ordered - Ambulatory referral to Hematology / Oncology - CBC with Differential/Platelet - CMP14+EGFR - Iron, TIBC and Ferritin Panel - B12 and Folate Panel  2. Purpura ([place]) Referred to [facility] hematology urgently, repeat labs ordered while waiting for an appointment - Ambulatory referral to Hematology / Oncology - CBC with Differential/Platelet - Iron, TIBC and Ferritin  Panel - B12 and Folate Panel  3. H. pylori infection Breath test ordered - H. pylori breath test  4. Mixed hyperlipidemia Routine labs ordered - CMP14+EGFR - Lipid Profile  5. Attention deficit disorder (ADD) without hyperactivity Refills x3 months, follow up in 3 months for additional refills.  - amphetamine-dextroamphetamine (ADDERALL) 10 MG tablet; Take 1 tablet (10 mg total) by mouth daily as needed.  Dispense: 30 tablet; Refill: 0 - amphetamine-dextroamphetamine (ADDERALL) 10 MG tablet; Take 1 tablet (10 mg total) by mouth daily as needed.  Dispense: 30 tablet; Refill: 0 - amphetamine-dextroamphetamine (ADDERALL) 30 MG tablet; Take 1 tablet by mouth daily.  Dispense: 30 tablet; Refill: 0 - amphetamine-dextroamphetamine (ADDERALL) 30 MG tablet; Take 1 tablet by mouth daily.  Dispense: 30 tablet; Refill: 0 - amphetamine-dextroamphetamine (ADDERALL) 10 MG tablet; Take 1 tablet (10 mg total) by mouth daily as needed.  Dispense: 30 tablet; Refill: 0 - amphetamine-dextroamphetamine (ADDERALL) 30 MG tablet; Take 1 tablet by mouth daily.  Dispense: 30 tablet; Refill: 0  6. GAD (generalized  anxiety disorder) Increased anxiety, Continue alprazolam prn as prescribed.  - ALPRAZolam (XANAX) 0.5 MG tablet; Take 1 tablet (0.5 mg total) by mouth at bedtime as needed for anxiety or sleep.  Dispense: 30 tablet; Refill: 0   General Counseling: Antanisha verbalizes understanding of the findings of todays visit and agrees with plan of treatment. I have discussed any further diagnostic evaluation that may be needed or ordered today. We also reviewed her medications today. she has been encouraged to call the office with any questions or concerns that should arise related to todays visit.    Orders Placed This Encounter  Procedures   H. pylori breath test   CBC with Differential/Platelet   CMP14+EGFR   Lipid Profile   Iron, TIBC and Ferritin Panel   B12 and Folate Panel   Ambulatory referral to Hematology / Oncology    Meds ordered this encounter  Medications   ALPRAZolam (XANAX) 0.5 MG tablet    Sig: Take 1 tablet (0.5 mg total) by mouth at bedtime as needed for anxiety or sleep.    Dispense:  30 tablet    Refill:  0   amphetamine-dextroamphetamine (ADDERALL) 10 MG tablet    Sig: Take 1 tablet (10 mg total) by mouth daily as needed.    Dispense:  30 tablet    Refill:  0    Fill for january   amphetamine-dextroamphetamine (ADDERALL) 10 MG tablet    Sig: Take 1 tablet (10 mg total) by mouth daily as needed.    Dispense:  30 tablet    Refill:  0    Fill for February.   amphetamine-dextroamphetamine (ADDERALL) 30 MG tablet    Sig: Take 1 tablet by mouth daily.    Dispense:  30 tablet    Refill:  0    Fill for january   amphetamine-dextroamphetamine (ADDERALL) 30 MG tablet    Sig: Take 1 tablet by mouth daily.    Dispense:  30 tablet    Refill:  0    Fill for february   amphetamine-dextroamphetamine (ADDERALL) 10 MG tablet    Sig: Take 1 tablet (10 mg total) by mouth daily as needed.    Dispense:  30 tablet    Refill:  0    Fill for march   amphetamine-dextroamphetamine  (ADDERALL) 30 MG tablet    Sig: Take 1 tablet by mouth daily.    Dispense:  30 tablet    Refill:  0  Fill for march   triamcinolone (NASACORT) 55 MCG/ACT AERO nasal inhaler    Sig: Place 2 sprays into the nose daily.    Dispense:  16.9 mL    Refill:  5    Return in about 2 weeks (around 10/06/2022) for F/U, Labs, Ahmaud Duthie PCP.   Total time spent:30 Minutes Time spent includes review of chart, medications, test results, and follow up plan with the patient.    Controlled Substance Database was reviewed by me.  This patient was seen by Jonetta Osgood, FNP-C in collaboration with Dr. Clayborn Bigness as a part of collaborative care agreement.   Charlee Squibb R. Valetta Fuller, MSN, FNP-C Internal medicine

## 2022-09-23 ENCOUNTER — Encounter: Payer: Self-pay | Admitting: Nurse Practitioner

## 2022-09-23 ENCOUNTER — Telehealth: Payer: Self-pay | Admitting: Nurse Practitioner

## 2022-09-23 NOTE — Telephone Encounter (Signed)
Awaiting 09/22/22 office notes for Hematology referral-Toni

## 2022-09-27 ENCOUNTER — Telehealth: Payer: Self-pay | Admitting: Nurse Practitioner

## 2022-09-27 LAB — CMP14+EGFR
ALT: 31 IU/L (ref 0–32)
AST: 24 IU/L (ref 0–40)
Albumin/Globulin Ratio: 1.7 (ref 1.2–2.2)
Albumin: 4.6 g/dL (ref 3.9–4.9)
Alkaline Phosphatase: 95 IU/L (ref 44–121)
BUN/Creatinine Ratio: 18 (ref 12–28)
BUN: 13 mg/dL (ref 8–27)
Bilirubin Total: 1 mg/dL (ref 0.0–1.2)
CO2: 25 mmol/L (ref 20–29)
Calcium: 10.3 mg/dL (ref 8.7–10.3)
Chloride: 100 mmol/L (ref 96–106)
Creatinine, Ser: 0.73 mg/dL (ref 0.57–1.00)
Globulin, Total: 2.7 g/dL (ref 1.5–4.5)
Glucose: 94 mg/dL (ref 70–99)
Potassium: 4 mmol/L (ref 3.5–5.2)
Sodium: 143 mmol/L (ref 134–144)
Total Protein: 7.3 g/dL (ref 6.0–8.5)
eGFR: 92 mL/min/{1.73_m2} (ref >59)

## 2022-09-27 LAB — IRON,TIBC AND FERRITIN PANEL
Ferritin: 146 ng/mL (ref 15–150)
Iron Saturation: 22 % (ref 15–55)
Iron: 91 ug/dL (ref 27–139)
Total Iron Binding Capacity: 415 ug/dL (ref 250–450)
UIBC: 324 ug/dL (ref 118–369)

## 2022-09-27 LAB — CBC WITH DIFFERENTIAL/PLATELET
Basophils Absolute: 0 10*3/uL (ref 0.0–0.2)
Basos: 1 %
EOS (ABSOLUTE): 0.1 10*3/uL (ref 0.0–0.4)
Eos: 2 %
Hematocrit: 50.6 % — ABNORMAL HIGH (ref 34.0–46.6)
Hemoglobin: 16.6 g/dL — ABNORMAL HIGH (ref 11.1–15.9)
Immature Grans (Abs): 0 10*3/uL (ref 0.0–0.1)
Immature Granulocytes: 1 %
Lymphocytes Absolute: 1.6 10*3/uL (ref 0.7–3.1)
Lymphs: 26 %
MCH: 27.6 pg (ref 26.6–33.0)
MCHC: 32.8 g/dL (ref 31.5–35.7)
MCV: 84 fL (ref 79–97)
Monocytes Absolute: 0.6 10*3/uL (ref 0.1–0.9)
Monocytes: 9 %
Neutrophils Absolute: 3.8 10*3/uL (ref 1.4–7.0)
Neutrophils: 61 %
Platelets: 360 10*3/uL (ref 150–450)
RBC: 6.02 x10E6/uL — ABNORMAL HIGH (ref 3.77–5.28)
RDW: 12.5 % (ref 11.7–15.4)
WBC: 6.1 10*3/uL (ref 3.4–10.8)

## 2022-09-27 LAB — LIPID PANEL
Chol/HDL Ratio: 2.7 ratio (ref 0.0–4.4)
Cholesterol, Total: 258 mg/dL — ABNORMAL HIGH (ref 100–199)
HDL: 95 mg/dL (ref >39)
LDL Chol Calc (NIH): 152 mg/dL — ABNORMAL HIGH (ref 0–99)
Triglycerides: 70 mg/dL (ref 0–149)
VLDL Cholesterol Cal: 11 mg/dL (ref 5–40)

## 2022-09-27 LAB — B12 AND FOLATE PANEL
Folate: >20 ng/mL (ref >3.0)
Vitamin B-12: 825 pg/mL (ref 232–1245)

## 2022-09-27 NOTE — Telephone Encounter (Signed)
Hematology referral per patient's request has been faxed to [facility] w/ [facility]; [phone]& Eli Phillips with [facility]; [phone]. 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

## 2022-09-30 ENCOUNTER — Telehealth: Payer: Self-pay | Admitting: Nurse Practitioner

## 2022-09-30 NOTE — Telephone Encounter (Signed)
Patient called requesting hematology referral be faxed to a different location @ [facility]. Per her request, referral faxed to [phone]

## 2022-10-06 ENCOUNTER — Encounter: Payer: Self-pay | Admitting: Nurse Practitioner

## 2022-10-06 ENCOUNTER — Ambulatory Visit: Payer: BLUE CROSS/BLUE SHIELD | Admitting: Nurse Practitioner

## 2022-10-06 VITALS — BP 132/86 | HR 96 | Temp 98.1°F | Resp 16 | Ht <= 58 in | Wt 183.4 lb

## 2022-10-06 DIAGNOSIS — N951 Menopausal and female climacteric states: Secondary | ICD-10-CM

## 2022-10-06 DIAGNOSIS — D751 Secondary polycythemia: Secondary | ICD-10-CM | POA: Diagnosis not present

## 2022-10-06 DIAGNOSIS — F5101 Primary insomnia: Secondary | ICD-10-CM

## 2022-10-06 MED ORDER — ESTRADIOL-LEVONORGESTREL 0.045-0.015 MG/DAY TD PTWK: 1.0000 | MEDICATED_PATCH | TRANSDERMAL | 5 refills | Status: DC

## 2022-10-06 NOTE — Progress Notes (Signed)
[facility] [place], [place] [address]  Internal MEDICINE  Office Visit Note  Patient Name: Megan [name]  [id number]  [id number]  Date of Service: 10/06/2022  Chief Complaint  Patient presents with   Follow-up    Review labs     HPI Lavona presents for a follow-up visit for lab results, and vasomotor symptoms  --Labs reviewed with patient today -- cholesterol is improving. The rest of the labs are normal except for her CBC which still shows the elevated RBC, HGB and HCT.  Mammogram is negative  Waiting for hematology to call to schedule appointment Interested in trying hormone therapy replacement for vasomotor symptoms -- brain fog, mood swings, insomnia, fatigue.      Current Medication: Outpatient Encounter Medications as of 10/06/2022  Medication Sig   ALPRAZolam (XANAX) 0.5 MG tablet Take 1 tablet (0.5 mg total) by mouth at bedtime as needed for anxiety or sleep.   amLODipine (NORVASC) 10 MG tablet Take 1 tablet (10 mg total) by mouth daily.   amphetamine-dextroamphetamine (ADDERALL) 10 MG tablet Take 1 tablet (10 mg total) by mouth daily as needed.   [START ON 10/20/2022] amphetamine-dextroamphetamine (ADDERALL) 10 MG tablet Take 1 tablet (10 mg total) by mouth daily as needed.   [START ON 11/17/2022] amphetamine-dextroamphetamine (ADDERALL) 10 MG tablet Take 1 tablet (10 mg total) by mouth daily as needed.   amphetamine-dextroamphetamine (ADDERALL) 30 MG tablet Take 1 tablet by mouth daily.   [START ON 10/20/2022] amphetamine-dextroamphetamine (ADDERALL) 30 MG tablet Take 1 tablet by mouth daily.   [START ON 11/17/2022] amphetamine-dextroamphetamine (ADDERALL) 30 MG tablet Take 1 tablet by mouth daily.   estradiol-levonorgestrel (CLIMARAPRO) 0.045-0.015 MG/DAY Place 1 patch onto the skin once a week.   lidocaine (LIDODERM) 5 % Place 1 patch onto the skin daily. Remove & Discard patch within 12 hours or as directed by MD   meloxicam (MOBIC) 15 MG  tablet Take 1 tablet (15 mg total) by mouth daily.   pantoprazole (PROTONIX) 20 MG tablet Take 1 tablet (20 mg total) by mouth 2 (two) times daily.   TAURINE PO Take by mouth.   triamcinolone (NASACORT) 55 MCG/ACT AERO nasal inhaler Place 2 sprays into the nose daily.   valACYclovir (VALTREX) 500 MG tablet TAKE 1 TABLET BY MOUTH TWICE A DAY   No facility-administered encounter medications on file as of 10/06/2022.    Surgical History: Past Surgical History:  Procedure Laterality Date   BREAST BIOPSY Left 2002   benign   BUNIONECTOMY Right    CESAREAN SECTION     COLONOSCOPY N/A 01/10/2022   Procedure: COLONOSCOPY;  Surgeon: Lucilla Lame, MD;  Location: [facility];  Service: Endoscopy;  Laterality: N/A;   ESOPHAGOGASTRODUODENOSCOPY N/A 01/10/2022   Procedure: ESOPHAGOGASTRODUODENOSCOPY (EGD);  Surgeon: Lucilla Lame, MD;  Location: [facility];  Service: Endoscopy;  Laterality: N/A;  Latex    Medical History: Past Medical History:  Diagnosis Date   ADD (attention deficit disorder)    Arthritis    feet   Family history of adverse reaction to anesthesia    Sister - MI during knee surgery   Motion sickness    Sea Sick   Pre-hypertension    Wears contact lenses     Family History: Family History  Problem Relation Age of Onset   Lung cancer Father    Diabetes Sister    Breast cancer Neg Hx     Social History   Socioeconomic History   Marital status: Married  Spouse name: Not on file   Number of children: Not on file   Years of education: Not on file   Highest education level: Not on file  Occupational History   Not on file  Tobacco Use   Smoking status: Never   Smokeless tobacco: Never  Vaping Use   Vaping Use: Never used  Substance and Sexual Activity   Alcohol use: Yes    Comment: social   Drug use: Not Currently    Types: Marijuana    Comment: once in a while    Sexual activity: Not on file  Other Topics Concern   Not on file  Social  History Narrative   Not on file   Social Determinants of Health   Financial Resource Strain: Not on file  Food Insecurity: Not on file  Transportation Needs: Not on file  Physical Activity: Not on file  Stress: Not on file  Social Connections: Not on file  Intimate Partner Violence: Not on file      Review of Systems  Constitutional:  Positive for fatigue.  HENT: Negative.    Respiratory: Negative.  Negative for cough, chest tightness, shortness of breath and wheezing.   Cardiovascular: Negative.  Negative for chest pain and palpitations.  Gastrointestinal:  Negative for constipation, diarrhea, nausea and vomiting.  Musculoskeletal:  Positive for arthralgias.  Psychiatric/Behavioral:  Positive for dysphoric mood and sleep disturbance. The patient is nervous/anxious.     Vital Signs: BP 132/86   Pulse 96 Comment: 119  Temp 98.1 F (36.7 C)   Resp 16   Ht '4\' 10"'$  (1.473 m)   Wt 183 lb 6.4 oz (83.2 kg)   SpO2 98%   BMI 38.33 kg/m    Physical Exam Vitals reviewed.  Constitutional:      General: She is not in acute distress.    Appearance: Normal appearance. She is obese. She is not ill-appearing.  HENT:     Head: Normocephalic and atraumatic.  Eyes:     Pupils: Pupils are equal, round, and reactive to light.  Cardiovascular:     Rate and Rhythm: Normal rate and regular rhythm.  Pulmonary:     Effort: Pulmonary effort is normal. No respiratory distress.  Neurological:     Mental Status: She is alert and oriented to person, place, and time.  Psychiatric:        Mood and Affect: Mood normal.        Behavior: Behavior normal.        Assessment/Plan: 1. Vasomotor symptoms due to menopause Will tx vasomotor symptoms with a low dose hormone patch. Follow up in 1 month - estradiol-levonorgestrel (CLIMARAPRO) 0.045-0.015 MG/DAY; Place 1 patch onto the skin once a week.  Dispense: 4 patch; Refill: 5  2. Erythrocytosis Waiting for a call from hematology to  schedule an appointment  3. Primary insomnia Hoping that insomnia will improve with tx of vasomotor sx caused by menopause.  - estradiol-levonorgestrel (CLIMARAPRO) 0.045-0.015 MG/DAY; Place 1 patch onto the skin once a week.  Dispense: 4 patch; Refill: 5   General Counseling: Marianne verbalizes understanding of the findings of todays visit and agrees with plan of treatment. I have discussed any further diagnostic evaluation that may be needed or ordered today. We also reviewed her medications today. she has been encouraged to call the office with any questions or concerns that should arise related to todays visit.    No orders of the defined types were placed in this encounter.   Meds ordered  this encounter  Medications   estradiol-levonorgestrel (CLIMARAPRO) 0.045-0.015 MG/DAY    Sig: Place 1 patch onto the skin once a week.    Dispense:  4 patch    Refill:  5    Please fill as brand name, patient has copay saving card info    Return in about 1 month (around 11/06/2022) for F/U, eval new med, [place] PCP.   Total time spent:30 Minutes Time spent includes review of chart, medications, test results, and follow up plan with the patient.   [place] Controlled Substance Database was reviewed by me.  This patient was seen by Jonetta Osgood, FNP-C in collaboration with Dr. Clayborn Bigness as a part of collaborative care agreement.   Bellarose Burtt R. Valetta Fuller, MSN, FNP-C Internal medicine

## 2022-10-08 ENCOUNTER — Encounter: Payer: Self-pay | Admitting: Nurse Practitioner

## 2022-11-07 ENCOUNTER — Ambulatory Visit: Payer: BLUE CROSS/BLUE SHIELD | Admitting: Nurse Practitioner

## 2022-11-09 ENCOUNTER — Encounter: Payer: Self-pay | Admitting: Nurse Practitioner

## 2022-11-09 ENCOUNTER — Ambulatory Visit (INDEPENDENT_AMBULATORY_CARE_PROVIDER_SITE_OTHER): Payer: BLUE CROSS/BLUE SHIELD | Admitting: Nurse Practitioner

## 2022-11-09 VITALS — BP 140/91 | HR 113 | Temp 98.4°F | Resp 16 | Ht <= 58 in | Wt 184.0 lb

## 2022-11-09 DIAGNOSIS — D751 Secondary polycythemia: Secondary | ICD-10-CM | POA: Diagnosis not present

## 2022-11-09 DIAGNOSIS — F5101 Primary insomnia: Secondary | ICD-10-CM

## 2022-11-09 DIAGNOSIS — N951 Menopausal and female climacteric states: Secondary | ICD-10-CM

## 2022-11-09 DIAGNOSIS — F988 Other specified behavioral and emotional disorders with onset usually occurring in childhood and adolescence: Secondary | ICD-10-CM | POA: Diagnosis not present

## 2022-11-09 MED ORDER — LISDEXAMFETAMINE DIMESYLATE 30 MG PO CAPS: 30.0000 mg | ORAL_CAPSULE | Freq: Every day | ORAL | 0 refills | Status: DC

## 2022-11-09 MED ORDER — CLONIDINE HCL 0.1 MG PO TABS: 0.1000 mg | ORAL_TABLET | Freq: Two times a day (BID) | ORAL | 2 refills | Status: DC

## 2022-11-09 MED ORDER — CLONIDINE HCL 0.1 MG PO TABS: 0.1000 mg | ORAL_TABLET | Freq: Every day | ORAL | 2 refills | Status: DC

## 2022-11-09 NOTE — Progress Notes (Signed)
[facility] [place], [place] [address]  Internal MEDICINE  Office Visit Note  Patient Name: Megan [name]  [id number]  [id number]  Date of Service: 11/09/2022  Chief Complaint  Patient presents with   Follow-up    Eval new med.     HPI Megan [name] presents for a follow-up visit for erythrocytosis, ADHD, insomnia and hypertension. Seen by hematology, waiting for labs to result. Being evaluated for polycythemia.  ADHD -- wants to try vyvanse instead of adderall, heard that it was better for some people. Has taken adderall for a long time.  Insomnia -- wants to try clonidine instead of xanax which may also help with anxiety and hypertension  Hypertension -- still slightly elevated. Takes amlodipine.  Hematology does not want her to take HRT so she has decided not to take the climarapro patch.     Current Medication: Outpatient Encounter Medications as of 11/09/2022  Medication Sig   amLODipine (NORVASC) 10 MG tablet Take 1 tablet (10 mg total) by mouth daily.   amphetamine-dextroamphetamine (ADDERALL) 10 MG tablet Take 1 tablet (10 mg total) by mouth daily as needed.   amphetamine-dextroamphetamine (ADDERALL) 10 MG tablet Take 1 tablet (10 mg total) by mouth daily as needed.   [START ON 11/17/2022] amphetamine-dextroamphetamine (ADDERALL) 10 MG tablet Take 1 tablet (10 mg total) by mouth daily as needed.   amphetamine-dextroamphetamine (ADDERALL) 30 MG tablet Take 1 tablet by mouth daily.   amphetamine-dextroamphetamine (ADDERALL) 30 MG tablet Take 1 tablet by mouth daily.   [START ON 11/17/2022] amphetamine-dextroamphetamine (ADDERALL) 30 MG tablet Take 1 tablet by mouth daily.   cloNIDine (CATAPRES) 0.1 MG tablet Take 1 tablet (0.1 mg total) by mouth 2 (two) times daily.   lidocaine (LIDODERM) 5 % Place 1 patch onto the skin daily. Remove & Discard patch within 12 hours or as directed by MD   lisdexamfetamine (VYVANSE) 30 MG capsule Take 1 capsule (30 mg  total) by mouth daily.   meloxicam (MOBIC) 15 MG tablet Take 1 tablet (15 mg total) by mouth daily.   pantoprazole (PROTONIX) 20 MG tablet Take 1 tablet (20 mg total) by mouth 2 (two) times daily.   TAURINE PO Take by mouth.   triamcinolone (NASACORT) 55 MCG/ACT AERO nasal inhaler Place 2 sprays into the nose daily.   valACYclovir (VALTREX) 500 MG tablet TAKE 1 TABLET BY MOUTH TWICE A DAY   [DISCONTINUED] ALPRAZolam (XANAX) 0.5 MG tablet Take 1 tablet (0.5 mg total) by mouth at bedtime as needed for anxiety or sleep.   [DISCONTINUED] estradiol-levonorgestrel (CLIMARAPRO) 0.045-0.015 MG/DAY Place 1 patch onto the skin once a week.   No facility-administered encounter medications on file as of 11/09/2022.    Surgical History: Past Surgical History:  Procedure Laterality Date   BREAST BIOPSY Left 2002   benign   BUNIONECTOMY Right    CESAREAN SECTION     COLONOSCOPY N/A 01/10/2022   Procedure: COLONOSCOPY;  Surgeon: Lucilla Lame, MD;  Location: [facility];  Service: Endoscopy;  Laterality: N/A;   ESOPHAGOGASTRODUODENOSCOPY N/A 01/10/2022   Procedure: ESOPHAGOGASTRODUODENOSCOPY (EGD);  Surgeon: Lucilla Lame, MD;  Location: [facility] [place];  Service: Endoscopy;  Laterality: N/A;  Latex    Medical History: Past Medical History:  Diagnosis Date   ADD (attention deficit disorder)    Arthritis    feet   Family history of adverse reaction to anesthesia    Sister - MI during knee surgery   Motion sickness    Sea Sick  Pre-hypertension    Wears contact lenses     Family History: Family History  Problem Relation Age of Onset   Lung cancer Father    Diabetes Sister    Breast cancer Neg Hx     Social History   Socioeconomic History   Marital status: Married    Spouse name: Not on file   Number of children: Not on file   Years of education: Not on file   Highest education level: Not on file  Occupational History   Not on file  Tobacco Use   Smoking status: Never    Smokeless tobacco: Never  Vaping Use   Vaping Use: Never used  Substance and Sexual Activity   Alcohol use: Yes    Comment: social   Drug use: Not Currently    Types: Marijuana    Comment: once in a while    Sexual activity: Not on file  Other Topics Concern   Not on file  Social History Narrative   Not on file   Social Determinants of Health   Financial Resource Strain: Not on file  Food Insecurity: Not on file  Transportation Needs: Not on file  Physical Activity: Not on file  Stress: Not on file  Social Connections: Not on file  Intimate Partner Violence: Not on file      Review of Systems  Constitutional:  Positive for fatigue.  HENT: Negative.    Respiratory: Negative.  Negative for cough, chest tightness, shortness of breath and wheezing.   Cardiovascular: Negative.  Negative for chest pain and palpitations.  Gastrointestinal:  Negative for constipation, diarrhea, nausea and vomiting.  Musculoskeletal:  Positive for arthralgias.  Psychiatric/Behavioral:  Positive for dysphoric mood and sleep disturbance. The patient is nervous/anxious.     Vital Signs: BP (!) 140/91   Pulse (!) 113   Temp 98.4 F (36.9 C)   Resp 16   Ht '4\' 10"'$  (1.473 m)   Wt 184 lb (83.5 kg)   SpO2 93%   BMI 38.46 kg/m    Physical Exam Vitals reviewed.  Constitutional:      General: She is not in acute distress.    Appearance: Normal appearance. She is obese. She is not ill-appearing.  HENT:     Head: Normocephalic and atraumatic.  Eyes:     Pupils: Pupils are equal, round, and reactive to light.  Cardiovascular:     Rate and Rhythm: Normal rate and regular rhythm.  Pulmonary:     Effort: Pulmonary effort is normal. No respiratory distress.  Neurological:     Mental Status: She is alert and oriented to person, place, and time.  Psychiatric:        Mood and Affect: Mood normal.        Behavior: Behavior normal.        Assessment/Plan: 1. Vasomotor symptoms due to  menopause Clonidine may help with hot flashes as a secondary benefit. Discontinue climarapro patches. - cloNIDine (CATAPRES) 0.1 MG tablet; Take 1 tablet (0.1 mg total) by mouth at bedtime.  Dispense: 30 tablet; Refill: 2  2. Erythrocytosis Sees hematology, waiting for lab results.   3. Primary insomnia Start clonidine at bedtime, may also help with anxiety, ADHD and hot flashes. Patient instructed to take her BP at home about 1 hour after taking clonidine to make sure her BP does not drop too low. Follow up in 1 month.  Discontinue alprazolam.  - cloNIDine (CATAPRES) 0.1 MG tablet; Take 1 tablet (0.1 mg total) by  mouth at bedtime.  Dispense: 30 tablet; Refill: 2  4. Attention deficit disorder (ADD) without hyperactivity Switch from adderall to lisdexamfetamine. Will try for a month and then follow up in 4 weeks to discuss effectiveness.  - lisdexamfetamine (VYVANSE) 30 MG capsule; Take 1 capsule (30 mg total) by mouth daily.  Dispense: 30 capsule; Refill: 0 - cloNIDine (CATAPRES) 0.1 MG tablet; Take 1 tablet (0.1 mg total) by mouth at bedtime.  Dispense: 30 tablet; Refill: 2   General Counseling: Megan [name] verbalizes understanding of the findings of todays visit and agrees with plan of treatment. I have discussed any further diagnostic evaluation that may be needed or ordered today. We also reviewed her medications today. she has been encouraged to call the office with any questions or concerns that should arise related to todays visit.    No orders of the defined types were placed in this encounter.   Meds ordered this encounter  Medications   lisdexamfetamine (VYVANSE) 30 MG capsule    Sig: Take 1 capsule (30 mg total) by mouth daily.    Dispense:  30 capsule    Refill:  0    Discontinue adderall, fill new script today   cloNIDine (CATAPRES) 0.1 MG tablet    Sig: Take 1 tablet (0.1 mg total) by mouth 2 (two) times daily.    Dispense:  30 tablet    Refill:  2    Return in about  4 weeks (around 12/07/2022) for F/U, ADHD med check, [facility] PCP and sleep , eval new med.   Total time spent:30 Minutes Time spent includes review of chart, medications, test results, and follow up plan with the patient.    Controlled Substance Database was reviewed by me.  This patient was seen by Jonetta Osgood, FNP-C in collaboration with Dr. Clayborn Bigness as a part of collaborative care agreement.   Brandin Dilday R. Valetta Fuller, MSN, FNP-C Internal medicine

## 2022-12-12 ENCOUNTER — Ambulatory Visit: Payer: BLUE CROSS/BLUE SHIELD | Admitting: Nurse Practitioner

## 2022-12-13 ENCOUNTER — Encounter: Payer: Self-pay | Admitting: Nurse Practitioner

## 2022-12-13 ENCOUNTER — Ambulatory Visit (INDEPENDENT_AMBULATORY_CARE_PROVIDER_SITE_OTHER): Payer: BLUE CROSS/BLUE SHIELD | Admitting: Nurse Practitioner

## 2022-12-13 VITALS — BP 145/89 | HR 60 | Temp 97.5°F | Resp 16 | Ht <= 58 in | Wt 181.0 lb

## 2022-12-13 DIAGNOSIS — N951 Menopausal and female climacteric states: Secondary | ICD-10-CM | POA: Diagnosis not present

## 2022-12-13 DIAGNOSIS — E6609 Other obesity due to excess calories: Secondary | ICD-10-CM | POA: Diagnosis not present

## 2022-12-13 DIAGNOSIS — Z9189 Other specified personal risk factors, not elsewhere classified: Secondary | ICD-10-CM

## 2022-12-13 DIAGNOSIS — F5101 Primary insomnia: Secondary | ICD-10-CM

## 2022-12-13 DIAGNOSIS — F411 Generalized anxiety disorder: Secondary | ICD-10-CM

## 2022-12-13 DIAGNOSIS — F988 Other specified behavioral and emotional disorders with onset usually occurring in childhood and adolescence: Secondary | ICD-10-CM

## 2022-12-13 DIAGNOSIS — Z6837 Body mass index (BMI) 37.0-37.9, adult: Secondary | ICD-10-CM

## 2022-12-13 MED ORDER — SEMAGLUTIDE-WEIGHT MANAGEMENT 0.5 MG/0.5ML ~~LOC~~ SOAJ: 0.5000 mg | SUBCUTANEOUS | 2 refills | Status: DC

## 2022-12-13 MED ORDER — LISDEXAMFETAMINE DIMESYLATE 30 MG PO CAPS: 30.0000 mg | ORAL_CAPSULE | Freq: Every day | ORAL | 0 refills | Status: DC

## 2022-12-13 MED ORDER — CLONIDINE HCL 0.1 MG PO TABS: 0.1000 mg | ORAL_TABLET | Freq: Every day | ORAL | 2 refills | Status: DC

## 2022-12-13 MED ORDER — ALPRAZOLAM 0.5 MG PO TABS: 0.5000 mg | ORAL_TABLET | Freq: Two times a day (BID) | ORAL | 0 refills | Status: DC | PRN

## 2022-12-13 NOTE — Progress Notes (Signed)
[facility] [address] [place], [place] [address]  Internal MEDICINE  Office Visit Note  Patient Name: Megan [name]  [id number]  [id number]  Date of Service: 12/13/2022  Chief Complaint  Patient presents with   Follow-up    Med check    HPI Megan [name] presents for a follow-up visit for anxiety, insomnia and ADHD Anxiety -- takes prn alprazolam, due for refills.  Insomnia -- alprazolam helps patient sleep and she also takes clonidine as needed.  ADHD -- has been taking adderall for ADHD symptom control. Wants to try vyvanse. Heart rate is normal, BP is stable. Denies any palpitations or other adverse effects of the stimulant medications.  Seen by hematology-- her labs were negative but the specialist said she may need an ultrasound.  Interested in getting help with weight loss with medications. Also has a mild elevated ASCVD risk. Interested in [place].    The 10-year ASCVD risk score (Arnett DK, et al., 2019) is: 6.8%   Values used to calculate the score:     Age: 62 years     Sex: Female     Is Non-Hispanic African American: No     Diabetic: No     Tobacco smoker: No     Systolic Blood Pressure: 145 mmHg     Is BP treated: Yes     HDL Cholesterol: 95 mg/dL     Total Cholesterol: 258 mg/dL   Current Medication: Outpatient Encounter Medications as of 12/13/2022  Medication Sig   ALPRAZolam (XANAX) 0.5 MG tablet Take 1 tablet (0.5 mg total) by mouth 2 (two) times daily as needed for anxiety.   amLODipine (NORVASC) 10 MG tablet Take 1 tablet (10 mg total) by mouth daily.   lidocaine (LIDODERM) 5 % Place 1 patch onto the skin daily. Remove & Discard patch within 12 hours or as directed by MD   meloxicam (MOBIC) 15 MG tablet Take 1 tablet (15 mg total) by mouth daily.   pantoprazole (PROTONIX) 20 MG tablet Take 1 tablet (20 mg total) by mouth 2 (two) times daily.   Semaglutide-Weight Management 0.5 MG/0.5ML SOAJ Inject 0.5 mg into the skin once a week.    TAURINE PO Take by mouth.   triamcinolone (NASACORT) 55 MCG/ACT AERO nasal inhaler Place 2 sprays into the nose daily.   valACYclovir (VALTREX) 500 MG tablet TAKE 1 TABLET BY MOUTH TWICE A DAY   [DISCONTINUED] ALPRAZolam (XANAX) 1 MG tablet Take by mouth.   [DISCONTINUED] amphetamine-dextroamphetamine (ADDERALL) 10 MG tablet Take 1 tablet (10 mg total) by mouth daily as needed.   [DISCONTINUED] amphetamine-dextroamphetamine (ADDERALL) 30 MG tablet Take 1 tablet by mouth daily.   [DISCONTINUED] cloNIDine (CATAPRES) 0.1 MG tablet Take 1 tablet (0.1 mg total) by mouth at bedtime.   [DISCONTINUED] lisdexamfetamine (VYVANSE) 30 MG capsule Take 1 capsule (30 mg total) by mouth daily.   cloNIDine (CATAPRES) 0.1 MG tablet Take 1 tablet (0.1 mg total) by mouth at bedtime.   lisdexamfetamine (VYVANSE) 30 MG capsule Take 1 capsule (30 mg total) by mouth daily.   No facility-administered encounter medications on file as of 12/13/2022.    Surgical History: Past Surgical History:  Procedure Laterality Date   BREAST BIOPSY Left 2002   benign   BUNIONECTOMY Right    CESAREAN SECTION     COLONOSCOPY N/A 01/10/2022   Procedure: COLONOSCOPY;  Surgeon: Midge Minium, MD;  [facility];  Service: Endoscopy;  Laterality: N/A;   ESOPHAGOGASTRODUODENOSCOPY N/A 01/10/2022   Procedure:  ESOPHAGOGASTRODUODENOSCOPY (EGD);  Surgeon: Midge Minium, MD;  Location: [facility];  Service: Endoscopy;  Laterality: N/A;  Latex    Medical History: Past Medical History:  Diagnosis Date   ADD (attention deficit disorder)    Arthritis    feet   Family history of adverse reaction to anesthesia    Sister - MI during knee surgery   Motion sickness    Sea Sick   Pre-hypertension    Wears contact lenses     Family History: Family History  Problem Relation Age of Onset   Lung cancer Father    Diabetes Sister    Breast cancer Neg Hx     Social History   Socioeconomic History   Marital status:  Married    Spouse name: Not on file   Number of children: Not on file   Years of education: Not on file   Highest education level: Not on file  Occupational History   Not on file  Tobacco Use   Smoking status: Never   Smokeless tobacco: Never  Vaping Use   Vaping Use: Never used  Substance and Sexual Activity   Alcohol use: Yes    Comment: social   Drug use: Not Currently    Types: Marijuana    Comment: once in a while    Sexual activity: Not on file  Other Topics Concern   Not on file  Social History Narrative   Not on file   Social Determinants of Health   Financial Resource Strain: Not on file  Food Insecurity: Not on file  Transportation Needs: Not on file  Physical Activity: Not on file  Stress: Not on file  Social Connections: Not on file  Intimate Partner Violence: Not on file      Review of Systems  Constitutional:  Positive for fatigue.  HENT: Negative.    Respiratory: Negative.  Negative for cough, chest tightness, shortness of breath and wheezing.   Cardiovascular: Negative.  Negative for chest pain and palpitations.  Gastrointestinal:  Negative for constipation, diarrhea, nausea and vomiting.  Musculoskeletal:  Positive for arthralgias.  Psychiatric/Behavioral:  Positive for decreased concentration, dysphoric mood and sleep disturbance. The patient is nervous/anxious.     Vital Signs: BP (!) 145/89   Pulse 60   Temp (!) 97.5 F (36.4 C)   Resp 16   Ht 4\' 10"  (1.473 m)   Wt 181 lb (82.1 kg)   SpO2 97%   BMI 37.83 kg/m    Physical Exam Vitals reviewed.  Constitutional:      General: She is not in acute distress.    Appearance: Normal appearance. She is obese. She is not ill-appearing.  HENT:     Head: Normocephalic and atraumatic.  Eyes:     Pupils: Pupils are equal, round, and reactive to light.  Cardiovascular:     Rate and Rhythm: Normal rate and regular rhythm.  Pulmonary:     Effort: Pulmonary effort is normal. No respiratory  distress.  Neurological:     Mental Status: She is alert and oriented to person, place, and time.  Psychiatric:        Mood and Affect: Mood normal.        Behavior: Behavior normal.        Assessment/Plan: 1. Vasomotor symptoms due to menopause Clonidine prescribed for sleep and anxiety but can also help with hot flashes.  - cloNIDine (CATAPRES) 0.1 MG tablet; Take 1 tablet (0.1 mg total) by mouth at bedtime.  Dispense:  30 tablet; Refill: 2  2. Class 2 obesity due to excess calories without serious comorbidity with body mass index (BMI) of 37.0 to 37.9 in adult [facility] prescribed for weight loss and cardiovascular risk  - Semaglutide-Weight Management 0.5 MG/0.5ML SOAJ; Inject 0.5 mg into the skin once a week.  Dispense: 2 mL; Refill: 2  3. At high risk for cardiovascular disease Wegovy prescribed for increased cardiovascular risk  - Semaglutide-Weight Management 0.5 MG/0.5ML SOAJ; Inject 0.5 mg into the skin once a week.  Dispense: 2 mL; Refill: 2  4. Primary insomnia Continue prn alprazolam for anxiety and sleep, clonidine may also help with insomnia so continue clonidine as prescribed also.  - cloNIDine (CATAPRES) 0.1 MG tablet; Take 1 tablet (0.1 mg total) by mouth at bedtime.  Dispense: 30 tablet; Refill: 2 - ALPRAZolam (XANAX) 0.5 MG tablet; Take 1 tablet (0.5 mg total) by mouth 2 (two) times daily as needed for anxiety.  Dispense: 20 tablet; Refill: 0  5. Attention deficit disorder (ADD) without hyperactivity Vyvanse prescribed for ADHD, take as ordered. Clonidine also has some effect on ADHD symptoms and may help with this as well, take as prescribed.  - cloNIDine (CATAPRES) 0.1 MG tablet; Take 1 tablet (0.1 mg total) by mouth at bedtime.  Dispense: 30 tablet; Refill: 2 - lisdexamfetamine (VYVANSE) 30 MG capsule; Take 1 capsule (30 mg total) by mouth daily.  Dispense: 30 capsule; Refill: 0  6. GAD (generalized anxiety disorder) Take prn alprazolam as prescribed.  -  ALPRAZolam (XANAX) 0.5 MG tablet; Take 1 tablet (0.5 mg total) by mouth 2 (two) times daily as needed for anxiety.  Dispense: 20 tablet; Refill: 0   General Counseling: Maryela verbalizes understanding of the findings of todays visit and agrees with plan of treatment. I have discussed any further diagnostic evaluation that may be needed or ordered today. We also reviewed her medications today. she has been encouraged to call the office with any questions or concerns that should arise related to todays visit.    No orders of the defined types were placed in this encounter.   Meds ordered this encounter  Medications   cloNIDine (CATAPRES) 0.1 MG tablet    Sig: Take 1 tablet (0.1 mg total) by mouth at bedtime.    Dispense:  30 tablet    Refill:  2    Please disregard prior prescription, I have corrected the instructions.   lisdexamfetamine (VYVANSE) 30 MG capsule    Sig: Take 1 capsule (30 mg total) by mouth daily.    Dispense:  30 capsule    Refill:  0    fill new script today please and let patient know when ready   Semaglutide-Weight Management 0.5 MG/0.5ML SOAJ    Sig: Inject 0.5 mg into the skin once a week.    Dispense:  2 mL    Refill:  2    Dx code E66.01, Z91.89   ALPRAZolam (XANAX) 0.5 MG tablet    Sig: Take 1 tablet (0.5 mg total) by mouth 2 (two) times daily as needed for anxiety.    Dispense:  20 tablet    Refill:  0    Return in about 4 weeks (around 01/10/2023) for F/U, eval new med, Weight loss, Eric Morganti PCP.   Total time spent:30 Minutes Time spent includes review of chart, medications, test results, and follow up plan with the patient.   [place] Controlled Substance Database was reviewed by me.  This patient was seen by Sallyanne Kuster, FNP-C in  collaboration with Dr. Clayborn Bigness as a part of collaborative care agreement.   Alyxandra Tenbrink R. Valetta Fuller, MSN, FNP-C Internal medicine

## 2022-12-24 ENCOUNTER — Encounter: Payer: Self-pay | Admitting: Nurse Practitioner

## 2023-01-10 ENCOUNTER — Ambulatory Visit: Payer: BLUE CROSS/BLUE SHIELD | Admitting: Nurse Practitioner

## 2023-01-10 ENCOUNTER — Encounter: Payer: Self-pay | Admitting: Nurse Practitioner

## 2023-01-10 VITALS — BP 158/99 | HR 97 | Temp 98.1°F | Resp 16 | Ht <= 58 in | Wt 179.4 lb

## 2023-01-10 DIAGNOSIS — E6609 Other obesity due to excess calories: Secondary | ICD-10-CM | POA: Diagnosis not present

## 2023-01-10 DIAGNOSIS — Z6837 Body mass index (BMI) 37.0-37.9, adult: Secondary | ICD-10-CM

## 2023-01-10 DIAGNOSIS — F411 Generalized anxiety disorder: Secondary | ICD-10-CM

## 2023-01-10 DIAGNOSIS — N951 Menopausal and female climacteric states: Secondary | ICD-10-CM | POA: Diagnosis not present

## 2023-01-10 DIAGNOSIS — F5101 Primary insomnia: Secondary | ICD-10-CM | POA: Diagnosis not present

## 2023-01-10 DIAGNOSIS — F988 Other specified behavioral and emotional disorders with onset usually occurring in childhood and adolescence: Secondary | ICD-10-CM

## 2023-01-10 MED ORDER — ALPRAZOLAM 0.5 MG PO TABS: 0.5000 mg | ORAL_TABLET | Freq: Two times a day (BID) | ORAL | 0 refills | Status: DC | PRN

## 2023-01-10 MED ORDER — AMPHETAMINE-DEXTROAMPHETAMINE 30 MG PO TABS: 30.0000 mg | ORAL_TABLET | Freq: Two times a day (BID) | ORAL | 0 refills | Status: DC

## 2023-01-10 NOTE — Progress Notes (Signed)
[facility] [address] [place], [place] [address]  Internal MEDICINE  Office Visit Note  Patient Name: Megan [name]  [id number]  [id number]  Date of Service: 01/10/2023  Chief Complaint  Patient presents with   Follow-up    Med check     HPI [name] presents for a follow-up visit for hypertension, ADHD, and insomnia Hypertension -- clonidine helping at night. Forgot to take other BP medication this morning. Plans to take the medication as soon as she leaves.  ADHD -- vyvanse did not work well for the patient, was not having adverse side effects but did not have the desired outcome. Denies any adverse side effects and blood pressure and heart rate are normal.  Insomnia -- takes alprazolam prn     Current Medication: Outpatient Encounter Medications as of 01/10/2023  Medication Sig   amphetamine-dextroamphetamine (ADDERALL XR) 30 MG 24 hr capsule Take 1 capsule by mouth every morning.   amphetamine-dextroamphetamine (ADDERALL) 30 MG tablet Take 1 tablet by mouth 2 (two) times daily. Do not take 2nd dose after 2 pm.   ALPRAZolam (XANAX) 0.5 MG tablet Take 1 tablet (0.5 mg total) by mouth 2 (two) times daily as needed for anxiety.   amLODipine (NORVASC) 10 MG tablet Take 1 tablet (10 mg total) by mouth daily.   amphetamine-dextroamphetamine (ADDERALL) 10 MG tablet Take 1 tablet by mouth daily as needed.   cloNIDine (CATAPRES) 0.1 MG tablet Take 1 tablet (0.1 mg total) by mouth at bedtime.   lidocaine (LIDODERM) 5 % Place 1 patch onto the skin daily. Remove & Discard patch within 12 hours or as directed by MD   meloxicam (MOBIC) 15 MG tablet Take 1 tablet (15 mg total) by mouth daily.   pantoprazole (PROTONIX) 20 MG tablet Take 1 tablet (20 mg total) by mouth 2 (two) times daily.   Semaglutide-Weight Management 0.5 MG/0.5ML SOAJ Inject 0.5 mg into the skin once a week.   TAURINE PO Take by mouth.   triamcinolone (NASACORT) 55 MCG/ACT AERO nasal inhaler Place 2  sprays into the nose daily.   valACYclovir (VALTREX) 500 MG tablet TAKE 1 TABLET BY MOUTH TWICE A DAY   [DISCONTINUED] ALPRAZolam (XANAX) 0.5 MG tablet Take 1 tablet (0.5 mg total) by mouth 2 (two) times daily as needed for anxiety.   [DISCONTINUED] lisdexamfetamine (VYVANSE) 30 MG capsule Take 1 capsule (30 mg total) by mouth daily.   No facility-administered encounter medications on file as of 01/10/2023.    Surgical History: Past Surgical History:  Procedure Laterality Date   BREAST BIOPSY Left 2002   benign   BUNIONECTOMY Right    CESAREAN SECTION     COLONOSCOPY N/A 01/10/2022   Procedure: COLONOSCOPY;  Surgeon: Midge Minium, MD;  Location: [facility];  Service: Endoscopy;  Laterality: N/A;   ESOPHAGOGASTRODUODENOSCOPY N/A 01/10/2022   Procedure: ESOPHAGOGASTRODUODENOSCOPY (EGD);  Surgeon: Midge Minium, MD;  Location: [facility];  Service: Endoscopy;  Laterality: N/A;  Latex    Medical History: Past Medical History:  Diagnosis Date   ADD (attention deficit disorder)    Arthritis    feet   Family history of adverse reaction to anesthesia    Sister - MI during knee surgery   Motion sickness    Sea Sick   Pre-hypertension    Wears contact lenses     Family History: Family History  Problem Relation Age of Onset   Lung cancer Father    Diabetes Sister    Breast cancer Neg  Hx     Social History   Socioeconomic History   Marital status: Married    Spouse name: Not on file   Number of children: Not on file   Years of education: Not on file   Highest education level: Not on file  Occupational History   Not on file  Tobacco Use   Smoking status: Never   Smokeless tobacco: Never  Vaping Use   Vaping Use: Never used  Substance and Sexual Activity   Alcohol use: Yes    Comment: social   Drug use: Not Currently    Types: Marijuana    Comment: once in a while    Sexual activity: Not on file  Other Topics Concern   Not on file  Social History  Narrative   Not on file   Social Determinants of Health   Financial Resource Strain: Not on file  Food Insecurity: Not on file  Transportation Needs: Not on file  Physical Activity: Not on file  Stress: Not on file  Social Connections: Not on file  Intimate Partner Violence: Not on file      Review of Systems  Constitutional:  Positive for fatigue.  HENT: Negative.    Respiratory: Negative.  Negative for cough, chest tightness, shortness of breath and wheezing.   Cardiovascular: Negative.  Negative for chest pain and palpitations.  Gastrointestinal:  Negative for constipation, diarrhea, nausea and vomiting.  Musculoskeletal:  Positive for arthralgias.  Psychiatric/Behavioral:  Positive for decreased concentration, dysphoric mood and sleep disturbance. The patient is nervous/anxious.     Vital Signs: BP (!) 158/99   Pulse 97   Temp 98.1 F (36.7 C)   Resp 16   Ht 4\' 10"  (1.473 m)   Wt 179 lb 6.4 oz (81.4 kg)   SpO2 96%   BMI 37.49 kg/m    Physical Exam Vitals reviewed.  Constitutional:      General: She is not in acute distress.    Appearance: Normal appearance. She is obese. She is not ill-appearing.  HENT:     Head: Normocephalic and atraumatic.  Eyes:     Pupils: Pupils are equal, round, and reactive to light.  Cardiovascular:     Rate and Rhythm: Normal rate and regular rhythm.  Pulmonary:     Effort: Pulmonary effort is normal. No respiratory distress.  Skin:    Capillary Refill: Capillary refill takes 2 to 3 seconds.  Neurological:     Mental Status: She is alert and oriented to person, place, and time.  Psychiatric:        Mood and Affect: Mood normal.        Behavior: Behavior normal.        Assessment/Plan: 1. Vasomotor symptoms due to menopause On medications to help with symptoms, no issues  2. Class 2 obesity due to excess calories without serious comorbidity with body mass index (BMI) of 37.0 to 37.9 in adult Not currently taking any  medication for weight loss. Currently working on diet and lifestyle modifications  3. Attention deficit disorder (ADD) without hyperactivity Refills ordered x3 months, follow up in 3 months for additional refills.  - amphetamine-dextroamphetamine (ADDERALL XR) 30 MG 24 hr capsule; Take 1 capsule by mouth every morning. - amphetamine-dextroamphetamine (ADDERALL) 10 MG tablet; Take 1 tablet by mouth daily as needed. - amphetamine-dextroamphetamine (ADDERALL) 30 MG tablet; Take 1 tablet by mouth 2 (two) times daily. Do not take 2nd dose after 2 pm.  Dispense: 60 tablet; Refill: 0  4.  Primary insomnia Continue alprazolam as prescribed - ALPRAZolam (XANAX) 0.5 MG tablet; Take 1 tablet (0.5 mg total) by mouth 2 (two) times daily as needed for anxiety.  Dispense: 20 tablet; Refill: 0  5. GAD (generalized anxiety disorder) Continue alprazolam as prescribed.  - ALPRAZolam (XANAX) 0.5 MG tablet; Take 1 tablet (0.5 mg total) by mouth 2 (two) times daily as needed for anxiety.  Dispense: 20 tablet; Refill: 0   General Counseling: Corbyn verbalizes understanding of the findings of todays visit and agrees with plan of treatment. I have discussed any further diagnostic evaluation that may be needed or ordered today. We also reviewed her medications today. she has been encouraged to call the office with any questions or concerns that should arise related to todays visit.    No orders of the defined types were placed in this encounter.   Meds ordered this encounter  Medications   amphetamine-dextroamphetamine (ADDERALL) 30 MG tablet    Sig: Take 1 tablet by mouth 2 (two) times daily. Do not take 2nd dose after 2 pm.    Dispense:  60 tablet    Refill:  0    Discontinue vyvanse, fill new script today.   ALPRAZolam (XANAX) 0.5 MG tablet    Sig: Take 1 tablet (0.5 mg total) by mouth 2 (two) times daily as needed for anxiety.    Dispense:  20 tablet    Refill:  0    Return in about 4 weeks (around  02/07/2023) for F/U, ADHD med check, Keldrick Pomplun PCP new med.   Total time spent:30 Minutes Time spent includes review of chart, medications, test results, and follow up plan with the patient.   [place] Controlled Substance Database was reviewed by me.  This patient was seen by Sallyanne Kuster, FNP-C in collaboration with Dr. Beverely Risen as a part of collaborative care agreement.   Lun Muro R. Tedd Sias, MSN, FNP-C Internal medicine

## 2023-02-07 ENCOUNTER — Ambulatory Visit (INDEPENDENT_AMBULATORY_CARE_PROVIDER_SITE_OTHER): Payer: BLUE CROSS/BLUE SHIELD | Admitting: Nurse Practitioner

## 2023-02-07 ENCOUNTER — Encounter: Payer: Self-pay | Admitting: Nurse Practitioner

## 2023-02-07 VITALS — BP 135/75 | HR 98 | Temp 98.7°F | Resp 16 | Ht <= 58 in | Wt 180.0 lb

## 2023-02-07 DIAGNOSIS — F988 Other specified behavioral and emotional disorders with onset usually occurring in childhood and adolescence: Secondary | ICD-10-CM | POA: Diagnosis not present

## 2023-02-07 DIAGNOSIS — F411 Generalized anxiety disorder: Secondary | ICD-10-CM

## 2023-02-07 DIAGNOSIS — G4709 Other insomnia: Secondary | ICD-10-CM | POA: Diagnosis not present

## 2023-02-07 MED ORDER — ALPRAZOLAM 0.5 MG PO TABS: 0.5000 mg | ORAL_TABLET | Freq: Two times a day (BID) | ORAL | 2 refills | Status: DC | PRN

## 2023-02-07 MED ORDER — AMPHETAMINE-DEXTROAMPHETAMINE 30 MG PO TABS: 30.0000 mg | ORAL_TABLET | Freq: Two times a day (BID) | ORAL | 0 refills | Status: DC

## 2023-02-07 MED ORDER — ALPRAZOLAM 0.5 MG PO TABS: 0.5000 mg | ORAL_TABLET | Freq: Two times a day (BID) | ORAL | 0 refills | Status: DC | PRN

## 2023-02-07 NOTE — Progress Notes (Signed)
[facility] [address] [place], [place] [address]  Internal MEDICINE  Office Visit Note  Patient Name: Megan [name]  [id number]  [id number]  Date of Service: 02/07/2023  Chief Complaint  Patient presents with   Follow-up    ADHD med check     HPI Megan [name] presents for a follow-up visit for ADHD, refills Adderal dosing working better this time. The short term adderall twice a day is working better for her than the adderal XR did. Denies any palpitations or other adverse side effects of the medications. Heart rate and BP are normal.  Takes alprazolam for anxiety and is due for refills.  Alprazolam and clonidine are helping with her sleep at night.    Current Medication: Outpatient Encounter Medications as of 02/07/2023  Medication Sig   amLODipine (NORVASC) 10 MG tablet Take 1 tablet (10 mg total) by mouth daily.   cloNIDine (CATAPRES) 0.1 MG tablet Take 1 tablet (0.1 mg total) by mouth at bedtime.   lidocaine (LIDODERM) 5 % Place 1 patch onto the skin daily. Remove & Discard patch within 12 hours or as directed by MD   meloxicam (MOBIC) 15 MG tablet Take 1 tablet (15 mg total) by mouth daily.   pantoprazole (PROTONIX) 20 MG tablet Take 1 tablet (20 mg total) by mouth 2 (two) times daily.   Semaglutide-Weight Management 0.5 MG/0.5ML SOAJ Inject 0.5 mg into the skin once a week.   TAURINE PO Take by mouth.   triamcinolone (NASACORT) 55 MCG/ACT AERO nasal inhaler Place 2 sprays into the nose daily.   valACYclovir (VALTREX) 500 MG tablet TAKE 1 TABLET BY MOUTH TWICE A DAY   [DISCONTINUED] ALPRAZolam (XANAX) 0.5 MG tablet Take 1 tablet (0.5 mg total) by mouth 2 (two) times daily as needed for anxiety.   [DISCONTINUED] amphetamine-dextroamphetamine (ADDERALL XR) 30 MG 24 hr capsule Take 1 capsule by mouth every morning.   [DISCONTINUED] amphetamine-dextroamphetamine (ADDERALL) 10 MG tablet Take 1 tablet by mouth daily as needed.   [DISCONTINUED]  amphetamine-dextroamphetamine (ADDERALL) 30 MG tablet Take 1 tablet by mouth 2 (two) times daily. Do not take 2nd dose after 2 pm.   ALPRAZolam (XANAX) 0.5 MG tablet Take 1 tablet (0.5 mg total) by mouth 2 (two) times daily as needed for anxiety.   amphetamine-dextroamphetamine (ADDERALL) 30 MG tablet Take 1 tablet by mouth 2 (two) times daily. Do not take 2nd dose after 2 pm.   [START ON 03/07/2023] amphetamine-dextroamphetamine (ADDERALL) 30 MG tablet Take 1 tablet by mouth 2 (two) times daily. Do not take 2nd dose after 2 pm.   [START ON 04/04/2023] amphetamine-dextroamphetamine (ADDERALL) 30 MG tablet Take 1 tablet by mouth 2 (two) times daily. Do not take 2nd dose after 2 pm.   [DISCONTINUED] ALPRAZolam (XANAX) 0.5 MG tablet Take 1 tablet (0.5 mg total) by mouth 2 (two) times daily as needed for anxiety.   No facility-administered encounter medications on file as of 02/07/2023.    Surgical History: Past Surgical History:  Procedure Laterality Date   BREAST BIOPSY Left 2002   benign   BUNIONECTOMY Right    CESAREAN SECTION     COLONOSCOPY N/A 01/10/2022   Procedure: COLONOSCOPY;  Surgeon: Midge Minium, MD;  Location: [facility];  Service: Endoscopy;  Laterality: N/A;   ESOPHAGOGASTRODUODENOSCOPY N/A 01/10/2022   Procedure: ESOPHAGOGASTRODUODENOSCOPY (EGD);  Surgeon: Midge Minium, MD;  Location: [facility];  Service: Endoscopy;  Laterality: N/A;  Latex    Medical History: Past Medical History:  Diagnosis Date  ADD (attention deficit disorder)    Arthritis    feet   Family history of adverse reaction to anesthesia    Sister - MI during knee surgery   Motion sickness    Sea Sick   Pre-hypertension    Wears contact lenses     Family History: Family History  Problem Relation Age of Onset   Lung cancer Father    Diabetes Sister    Breast cancer Neg Hx     Social History   Socioeconomic History   Marital status: Married    Spouse name: Not on file   Number  of children: Not on file   Years of education: Not on file   Highest education level: Not on file  Occupational History   Not on file  Tobacco Use   Smoking status: Never   Smokeless tobacco: Never  Vaping Use   Vaping Use: Never used  Substance and Sexual Activity   Alcohol use: Yes    Comment: social   Drug use: Not Currently    Types: Marijuana    Comment: once in a while    Sexual activity: Not on file  Other Topics Concern   Not on file  Social History Narrative   Not on file   Social Determinants of Health   Financial Resource Strain: Not on file  Food Insecurity: Not on file  Transportation Needs: Not on file  Physical Activity: Not on file  Stress: Not on file  Social Connections: Not on file  Intimate Partner Violence: Not on file      Review of Systems  Constitutional:  Positive for fatigue.  HENT: Negative.    Respiratory: Negative.  Negative for cough, chest tightness, shortness of breath and wheezing.   Cardiovascular: Negative.  Negative for chest pain and palpitations.  Gastrointestinal:  Negative for constipation, diarrhea, nausea and vomiting.  Musculoskeletal:  Positive for arthralgias.  Psychiatric/Behavioral:  Positive for decreased concentration, dysphoric mood and sleep disturbance. The patient is nervous/anxious.     Vital Signs: BP 135/75 Comment: 150/90  Pulse 98   Temp 98.7 F (37.1 C)   Resp 16   Ht 4\' 10"  (1.473 m)   Wt 180 lb (81.6 kg)   SpO2 96%   BMI 37.62 kg/m    Physical Exam Vitals reviewed.  Constitutional:      General: She is not in acute distress.    Appearance: Normal appearance. She is obese. She is not ill-appearing.  HENT:     Head: Normocephalic and atraumatic.  Eyes:     Pupils: Pupils are equal, round, and reactive to light.  Cardiovascular:     Rate and Rhythm: Normal rate and regular rhythm.  Pulmonary:     Effort: Pulmonary effort is normal. No respiratory distress.  Neurological:     Mental  Status: She is alert and oriented to person, place, and time.  Psychiatric:        Mood and Affect: Mood normal.        Behavior: Behavior normal.        Assessment/Plan: 1. Other insomnia Continue clonidine and alprazolam as prescribed.  - ALPRAZolam (XANAX) 0.5 MG tablet; Take 1 tablet (0.5 mg total) by mouth 2 (two) times daily as needed for anxiety.  Dispense: 30 tablet; Refill: 2  2. Attention deficit disorder (ADD) without hyperactivity Continue adderall as prescribed, follow up in 3 months for additional refills  - amphetamine-dextroamphetamine (ADDERALL) 30 MG tablet; Take 1 tablet by mouth 2 (two)  times daily. Do not take 2nd dose after 2 pm.  Dispense: 60 tablet; Refill: 0 - amphetamine-dextroamphetamine (ADDERALL) 30 MG tablet; Take 1 tablet by mouth 2 (two) times daily. Do not take 2nd dose after 2 pm.  Dispense: 60 tablet; Refill: 0 - amphetamine-dextroamphetamine (ADDERALL) 30 MG tablet; Take 1 tablet by mouth 2 (two) times daily. Do not take 2nd dose after 2 pm.  Dispense: 60 tablet; Refill: 0  3. GAD (generalized anxiety disorder) Continue alprazolam as prescribed - ALPRAZolam (XANAX) 0.5 MG tablet; Take 1 tablet (0.5 mg total) by mouth 2 (two) times daily as needed for anxiety.  Dispense: 30 tablet; Refill: 2   General Counseling: Aley verbalizes understanding of the findings of todays visit and agrees with plan of treatment. I have discussed any further diagnostic evaluation that may be needed or ordered today. We also reviewed her medications today. she has been encouraged to call the office with any questions or concerns that should arise related to todays visit.    No orders of the defined types were placed in this encounter.   Meds ordered this encounter  Medications   amphetamine-dextroamphetamine (ADDERALL) 30 MG tablet    Sig: Take 1 tablet by mouth 2 (two) times daily. Do not take 2nd dose after 2 pm.    Dispense:  60 tablet    Refill:  0    Fill  for 02/07/23   amphetamine-dextroamphetamine (ADDERALL) 30 MG tablet    Sig: Take 1 tablet by mouth 2 (two) times daily. Do not take 2nd dose after 2 pm.    Dispense:  60 tablet    Refill:  0    Fill for 03/07/23   amphetamine-dextroamphetamine (ADDERALL) 30 MG tablet    Sig: Take 1 tablet by mouth 2 (two) times daily. Do not take 2nd dose after 2 pm.    Dispense:  60 tablet    Refill:  0    Fill for 04/04/23   DISCONTD: ALPRAZolam (XANAX) 0.5 MG tablet    Sig: Take 1 tablet (0.5 mg total) by mouth 2 (two) times daily as needed for anxiety.    Dispense:  30 tablet    Refill:  0   ALPRAZolam (XANAX) 0.5 MG tablet    Sig: Take 1 tablet (0.5 mg total) by mouth 2 (two) times daily as needed for anxiety.    Dispense:  30 tablet    Refill:  2    Return in about 3 months (around 05/03/2023) for F/U, ADHD med check, anxiety med refill, Mckinley Adelstein PCP.   Total time spent:30 Minutes Time spent includes review of chart, medications, test results, and follow up plan with the patient.   [place] Controlled Substance Database was reviewed by me.  This patient was seen by Sallyanne Kuster, FNP-C in collaboration with Dr. Beverely Risen as a part of collaborative care agreement.   Derell Bruun R. Tedd Sias, MSN, FNP-C Internal medicine

## 2023-02-16 ENCOUNTER — Encounter: Payer: Self-pay | Admitting: Nurse Practitioner

## 2023-02-23 ENCOUNTER — Other Ambulatory Visit: Payer: Self-pay | Admitting: Nurse Practitioner

## 2023-02-23 DIAGNOSIS — F988 Other specified behavioral and emotional disorders with onset usually occurring in childhood and adolescence: Secondary | ICD-10-CM

## 2023-02-23 DIAGNOSIS — N951 Menopausal and female climacteric states: Secondary | ICD-10-CM

## 2023-02-23 DIAGNOSIS — F5101 Primary insomnia: Secondary | ICD-10-CM

## 2023-05-08 ENCOUNTER — Ambulatory Visit: Payer: BLUE CROSS/BLUE SHIELD | Admitting: Nurse Practitioner

## 2023-05-08 ENCOUNTER — Encounter: Payer: Self-pay | Admitting: Nurse Practitioner

## 2023-05-08 VITALS — BP 125/85 | HR 90 | Temp 98.4°F | Resp 16 | Ht <= 58 in | Wt 181.0 lb

## 2023-05-08 DIAGNOSIS — E782 Mixed hyperlipidemia: Secondary | ICD-10-CM | POA: Diagnosis not present

## 2023-05-08 DIAGNOSIS — G4709 Other insomnia: Secondary | ICD-10-CM | POA: Diagnosis not present

## 2023-05-08 DIAGNOSIS — M170 Bilateral primary osteoarthritis of knee: Secondary | ICD-10-CM

## 2023-05-08 DIAGNOSIS — F411 Generalized anxiety disorder: Secondary | ICD-10-CM

## 2023-05-08 DIAGNOSIS — K760 Fatty (change of) liver, not elsewhere classified: Secondary | ICD-10-CM

## 2023-05-08 DIAGNOSIS — F988 Other specified behavioral and emotional disorders with onset usually occurring in childhood and adolescence: Secondary | ICD-10-CM

## 2023-05-08 MED ORDER — AMPHETAMINE-DEXTROAMPHETAMINE 30 MG PO TABS: 30.0000 mg | ORAL_TABLET | Freq: Two times a day (BID) | ORAL | 0 refills | Status: DC

## 2023-05-08 MED ORDER — ALPRAZOLAM 0.5 MG PO TABS: 0.5000 mg | ORAL_TABLET | Freq: Two times a day (BID) | ORAL | 2 refills | Status: DC | PRN

## 2023-05-08 NOTE — Progress Notes (Signed)
[facility] [address] [place], [place] [address]  Internal MEDICINE  Office Visit Note  Patient Name: Megan [name]  [id number]  [id number]  Date of Service: 05/08/2023  Chief Complaint  Patient presents with   Follow-up    HPI Megan [name] presents for a follow-up visit for diet/lifestyle changes, knee pain, high cholesterol, ADHD, and GAD, and insomnia.  Fatty liver -- labs for liver function has been normal. [facility] hematology did RUQ ultrasound which showed increased echogenicity with undulating contour which is consistent with fatty liver.  ---discussed some interventions and lifestyle changes to improve fatty liver.  Bilateral knee pain -- pain has been worse, sees Dr. Martha Clan at [facility] and going to physical therapy at [facility].  High cholesterol -- checked in January, LDL was 152. Influences fatty liver.  ADHD -- current dose remains effective. Heart rate and BP are normal. Denies palpitations or any other adverse side effects of the medication. Due for refills.  GAD and insomnia -- takes alprazolam prn for anxiety/sleep. Due for refills.   The 10-year ASCVD risk score (Arnett DK, et al., 2019) is: 5.7%   Values used to calculate the score:     Age: 62 years     Sex: Female     Is Non-Hispanic African American: No     Diabetic: No     Tobacco smoker: No     Systolic Blood Pressure: 125 mmHg     Is BP treated: Yes     HDL Cholesterol: 95 mg/dL     Total Cholesterol: 258 mg/dL    Current Medication: Outpatient Encounter Medications as of 05/08/2023  Medication Sig   amLODipine (NORVASC) 10 MG tablet Take 1 tablet (10 mg total) by mouth daily.   cloNIDine (CATAPRES) 0.1 MG tablet TAKE 1 TABLET BY MOUTH AT BEDTIME.   lidocaine (LIDODERM) 5 % Place 1 patch onto the skin daily. Remove & Discard patch within 12 hours or as directed by MD   meloxicam (MOBIC) 15 MG tablet Take 1 tablet (15 mg total) by mouth daily.   pantoprazole (PROTONIX) 20 MG tablet  Take 1 tablet (20 mg total) by mouth 2 (two) times daily.   Semaglutide-Weight Management 0.5 MG/0.5ML SOAJ Inject 0.5 mg into the skin once a week.   TAURINE PO Take by mouth.   triamcinolone (NASACORT) 55 MCG/ACT AERO nasal inhaler Place 2 sprays into the nose daily.   valACYclovir (VALTREX) 500 MG tablet TAKE 1 TABLET BY MOUTH TWICE A DAY   [DISCONTINUED] ALPRAZolam (XANAX) 0.5 MG tablet Take 1 tablet (0.5 mg total) by mouth 2 (two) times daily as needed for anxiety.   [DISCONTINUED] amphetamine-dextroamphetamine (ADDERALL) 30 MG tablet Take 1 tablet by mouth 2 (two) times daily. Do not take 2nd dose after 2 pm.   [DISCONTINUED] amphetamine-dextroamphetamine (ADDERALL) 30 MG tablet Take 1 tablet by mouth 2 (two) times daily. Do not take 2nd dose after 2 pm.   [DISCONTINUED] amphetamine-dextroamphetamine (ADDERALL) 30 MG tablet Take 1 tablet by mouth 2 (two) times daily. Do not take 2nd dose after 2 pm.   ALPRAZolam (XANAX) 0.5 MG tablet Take 1 tablet (0.5 mg total) by mouth 2 (two) times daily as needed for anxiety.   amphetamine-dextroamphetamine (ADDERALL) 30 MG tablet Take 1 tablet by mouth 2 (two) times daily. Do not take 2nd dose after 2 pm.   [START ON 06/05/2023] amphetamine-dextroamphetamine (ADDERALL) 30 MG tablet Take 1 tablet by mouth 2 (two) times daily. Do not take 2nd dose after 2 pm.   [  START ON 07/03/2023] amphetamine-dextroamphetamine (ADDERALL) 30 MG tablet Take 1 tablet by mouth 2 (two) times daily. Do not take 2nd dose after 2 pm.   No facility-administered encounter medications on file as of 05/08/2023.    Surgical History: Past Surgical History:  Procedure Laterality Date   BREAST BIOPSY Left 2002   benign   BUNIONECTOMY Right    CESAREAN SECTION     COLONOSCOPY N/A 01/10/2022   Procedure: COLONOSCOPY;  Surgeon: Midge Minium, MD;  Location: [facility];  Service: Endoscopy;  Laterality: N/A;   ESOPHAGOGASTRODUODENOSCOPY N/A 01/10/2022   Procedure:  ESOPHAGOGASTRODUODENOSCOPY (EGD);  Surgeon: Midge Minium, MD;  Location: [facility];  Service: Endoscopy;  Laterality: N/A;  Latex    Medical History: Past Medical History:  Diagnosis Date   ADD (attention deficit disorder)    Arthritis    feet   Family history of adverse reaction to anesthesia    Sister - MI during knee surgery   Motion sickness    Sea Sick   Pre-hypertension    Wears contact lenses     Family History: Family History  Problem Relation Age of Onset   Lung cancer Father    Diabetes Sister    Breast cancer Neg Hx     Social History   Socioeconomic History   Marital status: Married    Spouse name: Not on file   Number of children: Not on file   Years of education: Not on file   Highest education level: Not on file  Occupational History   Not on file  Tobacco Use   Smoking status: Never   Smokeless tobacco: Never  Vaping Use   Vaping status: Never Used  Substance and Sexual Activity   Alcohol use: Yes    Comment: social   Drug use: Not Currently    Types: Marijuana    Comment: once in a while    Sexual activity: Not on file  Other Topics Concern   Not on file  Social History Narrative   Not on file   Social Determinants of Health   Financial Resource Strain: Not on file  Food Insecurity: Not on file  Transportation Needs: Not on file  Physical Activity: Not on file  Stress: Not on file  Social Connections: Not on file  Intimate Partner Violence: Not on file      Review of Systems  Constitutional:  Positive for fatigue.  HENT: Negative.    Respiratory: Negative.  Negative for cough, chest tightness, shortness of breath and wheezing.   Cardiovascular: Negative.  Negative for chest pain and palpitations.  Gastrointestinal:  Negative for constipation, diarrhea, nausea and vomiting.  Musculoskeletal:  Positive for arthralgias and joint swelling.  Psychiatric/Behavioral:  Positive for decreased concentration, dysphoric mood and  sleep disturbance. The patient is nervous/anxious.     Vital Signs: BP 125/85 Comment: 130/90  Pulse 90   Temp 98.4 F (36.9 C)   Resp 16   Ht 4\' 10"  (1.473 m)   Wt 181 lb (82.1 kg)   SpO2 96%   BMI 37.83 kg/m    Physical Exam Vitals reviewed.  Constitutional:      General: She is not in acute distress.    Appearance: Normal appearance. She is obese. She is not ill-appearing.  HENT:     Head: Normocephalic and atraumatic.  Eyes:     Pupils: Pupils are equal, round, and reactive to light.  Cardiovascular:     Rate and Rhythm: Normal rate and regular  rhythm.  Pulmonary:     Effort: Pulmonary effort is normal. No respiratory distress.  Neurological:     Mental Status: She is alert and oriented to person, place, and time.  Psychiatric:        Mood and Affect: Mood normal.        Behavior: Behavior normal.        Assessment/Plan: 1. NAFLD (nonalcoholic fatty liver disease) Discussed diet and lifestyle modifications to help improve the liver.   2. Mixed hyperlipidemia Continue diet modifications to decrease cholesterol.   3. Primary osteoarthritis of both knees Following up with orthopedic at [facility] office   4. Other insomnia Continue prn alprazolam as prescribed. Follow up in 3 months for additional refills - ALPRAZolam (XANAX) 0.5 MG tablet; Take 1 tablet (0.5 mg total) by mouth 2 (two) times daily as needed for anxiety.  Dispense: 30 tablet; Refill: 2  5. GAD (generalized anxiety disorder) Continue prn alprazolam as prescribed. Follow up in 3 months for additional refills - ALPRAZolam (XANAX) 0.5 MG tablet; Take 1 tablet (0.5 mg total) by mouth 2 (two) times daily as needed for anxiety.  Dispense: 30 tablet; Refill: 2  6. Attention deficit disorder (ADD) without hyperactivity Continue adderall as prescribed, follow up in 3 months for additional refills.  - amphetamine-dextroamphetamine (ADDERALL) 30 MG tablet; Take 1 tablet by mouth 2 (two) times daily.  Do not take 2nd dose after 2 pm.  Dispense: 60 tablet; Refill: 0 - amphetamine-dextroamphetamine (ADDERALL) 30 MG tablet; Take 1 tablet by mouth 2 (two) times daily. Do not take 2nd dose after 2 pm.  Dispense: 60 tablet; Refill: 0 - amphetamine-dextroamphetamine (ADDERALL) 30 MG tablet; Take 1 tablet by mouth 2 (two) times daily. Do not take 2nd dose after 2 pm.  Dispense: 60 tablet; Refill: 0   General Counseling: Megan [name] verbalizes understanding of the findings of todays visit and agrees with plan of treatment. I have discussed any further diagnostic evaluation that may be needed or ordered today. We also reviewed her medications today. she has been encouraged to call the office with any questions or concerns that should arise related to todays visit.    No orders of the defined types were placed in this encounter.   Meds ordered this encounter  Medications   ALPRAZolam (XANAX) 0.5 MG tablet    Sig: Take 1 tablet (0.5 mg total) by mouth 2 (two) times daily as needed for anxiety.    Dispense:  30 tablet    Refill:  2   amphetamine-dextroamphetamine (ADDERALL) 30 MG tablet    Sig: Take 1 tablet by mouth 2 (two) times daily. Do not take 2nd dose after 2 pm.    Dispense:  60 tablet    Refill:  0    Fill for august   amphetamine-dextroamphetamine (ADDERALL) 30 MG tablet    Sig: Take 1 tablet by mouth 2 (two) times daily. Do not take 2nd dose after 2 pm.    Dispense:  60 tablet    Refill:  0    Fill for september   amphetamine-dextroamphetamine (ADDERALL) 30 MG tablet    Sig: Take 1 tablet by mouth 2 (two) times daily. Do not take 2nd dose after 2 pm.    Dispense:  60 tablet    Refill:  0    Fill for october    Return in about 3 months (around 08/01/2023) for F/U, ADHD med check, Charday Capetillo PCP.   Total time spent:30 Minutes Time spent includes review of chart,  medications, test results, and follow up plan with the patient.   [place] Controlled Substance Database was reviewed by  me.  This patient was seen by Sallyanne Kuster, FNP-C in collaboration with Dr. Beverely Risen as a part of collaborative care agreement.   Yenifer Saccente R. Tedd Sias, MSN, FNP-C Internal medicine

## 2023-06-04 ENCOUNTER — Encounter: Payer: Self-pay | Admitting: Nurse Practitioner

## 2023-06-04 DIAGNOSIS — F411 Generalized anxiety disorder: Secondary | ICD-10-CM | POA: Insufficient documentation

## 2023-06-04 DIAGNOSIS — M170 Bilateral primary osteoarthritis of knee: Secondary | ICD-10-CM | POA: Insufficient documentation

## 2023-06-04 DIAGNOSIS — K760 Fatty (change of) liver, not elsewhere classified: Secondary | ICD-10-CM | POA: Insufficient documentation

## 2023-06-04 DIAGNOSIS — E782 Mixed hyperlipidemia: Secondary | ICD-10-CM | POA: Insufficient documentation

## 2023-06-27 ENCOUNTER — Telehealth: Payer: Self-pay | Admitting: Nurse Practitioner

## 2023-06-27 NOTE — Telephone Encounter (Signed)
Left vm and sent mychart message to confirm 07/04/23 appointment-Toni

## 2023-07-04 ENCOUNTER — Encounter: Payer: BLUE CROSS/BLUE SHIELD | Admitting: Nurse Practitioner

## 2023-07-10 ENCOUNTER — Encounter: Payer: Self-pay | Admitting: Nurse Practitioner

## 2023-07-10 ENCOUNTER — Ambulatory Visit (INDEPENDENT_AMBULATORY_CARE_PROVIDER_SITE_OTHER): Payer: BLUE CROSS/BLUE SHIELD | Admitting: Nurse Practitioner

## 2023-07-10 VITALS — BP 128/88 | HR 95 | Temp 98.1°F | Resp 16 | Ht <= 58 in | Wt 179.0 lb

## 2023-07-10 DIAGNOSIS — F5101 Primary insomnia: Secondary | ICD-10-CM

## 2023-07-10 DIAGNOSIS — N951 Menopausal and female climacteric states: Secondary | ICD-10-CM

## 2023-07-10 DIAGNOSIS — G4709 Other insomnia: Secondary | ICD-10-CM | POA: Diagnosis not present

## 2023-07-10 DIAGNOSIS — Z0001 Encounter for general adult medical examination with abnormal findings: Secondary | ICD-10-CM | POA: Diagnosis not present

## 2023-07-10 DIAGNOSIS — I10 Essential (primary) hypertension: Secondary | ICD-10-CM

## 2023-07-10 DIAGNOSIS — F411 Generalized anxiety disorder: Secondary | ICD-10-CM

## 2023-07-10 DIAGNOSIS — F988 Other specified behavioral and emotional disorders with onset usually occurring in childhood and adolescence: Secondary | ICD-10-CM

## 2023-07-10 DIAGNOSIS — Z23 Encounter for immunization: Secondary | ICD-10-CM

## 2023-07-10 MED ORDER — ALPRAZOLAM 0.5 MG PO TABS
0.5000 mg | ORAL_TABLET | Freq: Two times a day (BID) | ORAL | 2 refills | Status: DC | PRN
Start: 2023-07-10 — End: 2023-10-23

## 2023-07-10 MED ORDER — AMPHETAMINE-DEXTROAMPHETAMINE 30 MG PO TABS: 30.0000 mg | ORAL_TABLET | Freq: Two times a day (BID) | ORAL | 0 refills | Status: DC

## 2023-07-10 MED ORDER — AMLODIPINE BESYLATE 10 MG PO TABS: 10.0000 mg | ORAL_TABLET | Freq: Every day | ORAL | 3 refills | Status: DC

## 2023-07-10 MED ORDER — CLONIDINE HCL 0.1 MG PO TABS: 0.1000 mg | ORAL_TABLET | Freq: Every day | ORAL | 1 refills | Status: DC

## 2023-07-10 NOTE — Progress Notes (Cosign Needed)
[facility] [address] [place], [place] [address]  Internal MEDICINE  Office Visit Note  Patient Name: Megan [name]  [id number]  [id number]  Date of Service: 07/10/2023  Chief Complaint  Patient presents with   Annual Exam    HPI Megan [name] presents for an annual well visit and physical exam.  Well-appearing 62 y.o. female with insomnia, hypertension, GAD, ADHD, arthritis, NAFLD, high cholesterol, high RBCs being monitored by hematology.  Routine CRC screening: due in 2033 Routine mammogram: January 2024 at [facility] birads category 1 negative  DEXA scan: due next year  Pap smear: due in December 2025 Labs: has had labs done with hematology recently.  New or worsening pain: none Other concerns: Fatty liver disease, getting labs done soon.    Current Medication: Outpatient Encounter Medications as of 07/10/2023  Medication Sig   TAURINE PO Take by mouth.   triamcinolone (NASACORT) 55 MCG/ACT AERO nasal inhaler Place 2 sprays into the nose daily.   valACYclovir (VALTREX) 500 MG tablet TAKE 1 TABLET BY MOUTH TWICE A DAY   [DISCONTINUED] ALPRAZolam (XANAX) 0.5 MG tablet Take 1 tablet (0.5 mg total) by mouth 2 (two) times daily as needed for anxiety.   [DISCONTINUED] amLODipine (NORVASC) 10 MG tablet Take 1 tablet (10 mg total) by mouth daily.   [DISCONTINUED] amphetamine-dextroamphetamine (ADDERALL) 30 MG tablet Take 1 tablet by mouth 2 (two) times daily. Do not take 2nd dose after 2 pm.   [DISCONTINUED] amphetamine-dextroamphetamine (ADDERALL) 30 MG tablet Take 1 tablet by mouth 2 (two) times daily. Do not take 2nd dose after 2 pm.   [DISCONTINUED] amphetamine-dextroamphetamine (ADDERALL) 30 MG tablet Take 1 tablet by mouth 2 (two) times daily. Do not take 2nd dose after 2 pm.   [DISCONTINUED] cloNIDine (CATAPRES) 0.1 MG tablet TAKE 1 TABLET BY MOUTH AT BEDTIME.   [DISCONTINUED] lidocaine (LIDODERM) 5 % Place 1 patch onto the skin daily. Remove & Discard patch within  12 hours or as directed by MD   [DISCONTINUED] meloxicam (MOBIC) 15 MG tablet Take 1 tablet (15 mg total) by mouth daily.   [DISCONTINUED] pantoprazole (PROTONIX) 20 MG tablet Take 1 tablet (20 mg total) by mouth 2 (two) times daily.   [DISCONTINUED] Semaglutide-Weight Management 0.5 MG/0.5ML SOAJ Inject 0.5 mg into the skin once a week.   ALPRAZolam (XANAX) 0.5 MG tablet Take 1 tablet (0.5 mg total) by mouth 2 (two) times daily as needed for anxiety.   amLODipine (NORVASC) 10 MG tablet Take 1 tablet (10 mg total) by mouth daily.   amphetamine-dextroamphetamine (ADDERALL) 30 MG tablet Take 1 tablet by mouth 2 (two) times daily. Do not take 2nd dose after 2 pm.   [START ON 09/04/2023] amphetamine-dextroamphetamine (ADDERALL) 30 MG tablet Take 1 tablet by mouth 2 (two) times daily. Do not take 2nd dose after 2 pm.   [START ON 10/02/2023] amphetamine-dextroamphetamine (ADDERALL) 30 MG tablet Take 1 tablet by mouth 2 (two) times daily. Do not take 2nd dose after 2 pm.   [DISCONTINUED] cloNIDine (CATAPRES) 0.1 MG tablet Take 1 tablet (0.1 mg total) by mouth at bedtime.   No facility-administered encounter medications on file as of 07/10/2023.    Surgical History: Past Surgical History:  Procedure Laterality Date   BREAST BIOPSY Left 2002   benign   BUNIONECTOMY Right    CESAREAN SECTION     COLONOSCOPY N/A 01/10/2022   Procedure: COLONOSCOPY;  Surgeon: Midge Minium, MD;  Location: [facility];  Service: Endoscopy;  Laterality: N/A;   ESOPHAGOGASTRODUODENOSCOPY  N/A 01/10/2022   Procedure: ESOPHAGOGASTRODUODENOSCOPY (EGD);  Surgeon: Midge Minium, MD;  Location: [facility];  Service: Endoscopy;  Laterality: N/A;  Latex    Medical History: Past Medical History:  Diagnosis Date   ADD (attention deficit disorder)    Arthritis    feet   Family history of adverse reaction to anesthesia    Sister - MI during knee surgery   Motion sickness    Sea Sick   Pre-hypertension    Wears  contact lenses     Family History: Family History  Problem Relation Age of Onset   Lung cancer Father    Diabetes Sister    Breast cancer Neg Hx     Social History   Socioeconomic History   Marital status: Married    Spouse name: Not on file   Number of children: Not on file   Years of education: Not on file   Highest education level: Not on file  Occupational History   Not on file  Tobacco Use   Smoking status: Never   Smokeless tobacco: Never  Vaping Use   Vaping status: Never Used  Substance and Sexual Activity   Alcohol use: Yes    Comment: social   Drug use: Not Currently    Types: Marijuana    Comment: once in a while    Sexual activity: Not on file  Other Topics Concern   Not on file  Social History Narrative   Not on file   Social Drivers of Health   Financial Resource Strain: Not on file  Food Insecurity: Not on file  Transportation Needs: Not on file  Physical Activity: Not on file  Stress: Not on file  Social Connections: Not on file  Intimate Partner Violence: Not on file      Review of Systems  Constitutional:  Positive for fatigue. Negative for activity change, appetite change, chills, fever and unexpected weight change.  HENT: Negative.  Negative for congestion, ear pain, postnasal drip, rhinorrhea, sore throat and trouble swallowing.   Eyes: Negative.   Respiratory: Negative.  Negative for cough, chest tightness, shortness of breath and wheezing.   Cardiovascular: Negative.  Negative for chest pain and palpitations.  Gastrointestinal: Negative.  Negative for abdominal pain, blood in stool, constipation, diarrhea, nausea and vomiting.  Endocrine: Negative.   Genitourinary: Negative.  Negative for difficulty urinating, dysuria, frequency, hematuria and urgency.  Musculoskeletal:  Positive for arthralgias and joint swelling. Negative for back pain, myalgias and neck pain.  Skin: Negative.  Negative for rash and wound.  Allergic/Immunologic:  Negative.  Negative for immunocompromised state.  Neurological: Negative.  Negative for dizziness, seizures, numbness and headaches.  Hematological: Negative.   Psychiatric/Behavioral:  Positive for decreased concentration, dysphoric mood and sleep disturbance. Negative for behavioral problems, self-injury and suicidal ideas. The patient is nervous/anxious.     Vital Signs: BP 128/88   Pulse 95   Temp 98.1 F (36.7 C)   Resp 16   Ht 4\' 10"  (1.473 m)   Wt 179 lb (81.2 kg)   SpO2 99%   BMI 37.41 kg/m    Physical Exam Vitals reviewed.  Constitutional:      General: She is not in acute distress.    Appearance: Normal appearance. She is well-developed. She is obese. She is not ill-appearing or diaphoretic.  HENT:     Head: Normocephalic and atraumatic.     Right Ear: Tympanic membrane, ear canal and external ear normal.     Left Ear:  Tympanic membrane, ear canal and external ear normal.     Nose: Nose normal. No congestion or rhinorrhea.     Mouth/Throat:     Mouth: Mucous membranes are moist.     Pharynx: Oropharynx is clear. No oropharyngeal exudate or posterior oropharyngeal erythema.  Eyes:     Extraocular Movements: Extraocular movements intact.     Conjunctiva/sclera: Conjunctivae normal.     Pupils: Pupils are equal, round, and reactive to light.  Neck:     Thyroid: No thyromegaly.     Vascular: No JVD.     Trachea: No tracheal deviation.  Cardiovascular:     Rate and Rhythm: Normal rate and regular rhythm.     Pulses: Normal pulses.     Heart sounds: Normal heart sounds. No murmur heard.    No friction rub. No gallop.  Pulmonary:     Effort: Pulmonary effort is normal. No respiratory distress.     Breath sounds: Normal breath sounds. No wheezing or rales.  Chest:     Chest wall: No tenderness.  Abdominal:     General: Bowel sounds are normal.     Palpations: Abdomen is soft.  Musculoskeletal:        General: Normal range of motion.     Cervical back: Normal  range of motion and neck supple.     Right lower leg: No edema.     Left lower leg: No edema.  Lymphadenopathy:     Cervical: No cervical adenopathy.  Skin:    General: Skin is warm and dry.     Capillary Refill: Capillary refill takes less than 2 seconds.  Neurological:     Mental Status: She is alert and oriented to person, place, and time.     Cranial Nerves: No cranial nerve deficit.     Coordination: Coordination normal.     Gait: Gait normal.  Psychiatric:        Mood and Affect: Mood normal.        Behavior: Behavior normal.        Thought Content: Thought content normal.        Judgment: Judgment normal.        Assessment/Plan: 1. Encounter for routine adult health examination with abnormal findings (Primary) Age-appropriate preventive screenings and vaccinations discussed, annual physical exam completed. Routine labs for health maintenance up to date currently. PHM updated.    2. Essential hypertension Continue amlodipine as prescribed.  - amLODipine (NORVASC) 10 MG tablet; Take 1 tablet (10 mg total) by mouth daily.  Dispense: 90 tablet; Refill: 3  3. Vasomotor symptoms due to menopause Tolerable, wants to try more natural treatments.   4. Other insomnia Continue prn alprazolam as prescribed. Follow up in 3 months for additional refills.  - ALPRAZolam (XANAX) 0.5 MG tablet; Take 1 tablet (0.5 mg total) by mouth 2 (two) times daily as needed for anxiety.  Dispense: 30 tablet; Refill: 2  5. GAD (generalized anxiety disorder) Continue prn alprazolam as prescribed. Follow up in 3 months for additional refills.  - ALPRAZolam (XANAX) 0.5 MG tablet; Take 1 tablet (0.5 mg total) by mouth 2 (two) times daily as needed for anxiety.  Dispense: 30 tablet; Refill: 2  6. Attention deficit disorder (ADD) without hyperactivity Continue adderall as prescribed, follow up in 3 months for additional refills. UDS due at next office visit.  - amphetamine-dextroamphetamine (ADDERALL)  30 MG tablet; Take 1 tablet by mouth 2 (two) times daily. Do not take 2nd dose after 2 pm.  Dispense: 60 tablet; Refill: 0 - amphetamine-dextroamphetamine (ADDERALL) 30 MG tablet; Take 1 tablet by mouth 2 (two) times daily. Do not take 2nd dose after 2 pm.  Dispense: 60 tablet; Refill: 0 - amphetamine-dextroamphetamine (ADDERALL) 30 MG tablet; Take 1 tablet by mouth 2 (two) times daily. Do not take 2nd dose after 2 pm.  Dispense: 60 tablet; Refill: 0     General Counseling: Leoni verbalizes understanding of the findings of todays visit and agrees with plan of treatment. I have discussed any further diagnostic evaluation that may be needed or ordered today. We also reviewed her medications today. she has been encouraged to call the office with any questions or concerns that should arise related to todays visit.    No orders of the defined types were placed in this encounter.   Meds ordered this encounter  Medications   amphetamine-dextroamphetamine (ADDERALL) 30 MG tablet    Sig: Take 1 tablet by mouth 2 (two) times daily. Do not take 2nd dose after 2 pm.    Dispense:  60 tablet    Refill:  0    Fill for November   amphetamine-dextroamphetamine (ADDERALL) 30 MG tablet    Sig: Take 1 tablet by mouth 2 (two) times daily. Do not take 2nd dose after 2 pm.    Dispense:  60 tablet    Refill:  0    Fill for December   amphetamine-dextroamphetamine (ADDERALL) 30 MG tablet    Sig: Take 1 tablet by mouth 2 (two) times daily. Do not take 2nd dose after 2 pm.    Dispense:  60 tablet    Refill:  0    Fill for january   amLODipine (NORVASC) 10 MG tablet    Sig: Take 1 tablet (10 mg total) by mouth daily.    Dispense:  90 tablet    Refill:  3   DISCONTD: cloNIDine (CATAPRES) 0.1 MG tablet    Sig: Take 1 tablet (0.1 mg total) by mouth at bedtime.    Dispense:  90 tablet    Refill:  1   ALPRAZolam (XANAX) 0.5 MG tablet    Sig: Take 1 tablet (0.5 mg total) by mouth 2 (two) times daily as  needed for anxiety.    Dispense:  30 tablet    Refill:  2    Return in about 4 months (around 10/25/2023) for F/U, ADHD med check, anxiety med refill, Lounell Schumacher PCP.   Total time spent:30 Minutes Time spent includes review of chart, medications, test results, and follow up plan with the patient.   [place] Controlled Substance Database was reviewed by me.  This patient was seen by Sallyanne Kuster, FNP-C in collaboration with Dr. Beverely Risen as a part of collaborative care agreement.  Kaziah Krizek R. Tedd Sias, MSN, FNP-C Internal medicine

## 2023-08-07 ENCOUNTER — Ambulatory Visit: Payer: BLUE CROSS/BLUE SHIELD | Admitting: Nurse Practitioner

## 2023-08-31 ENCOUNTER — Other Ambulatory Visit: Payer: Self-pay | Admitting: Nurse Practitioner

## 2023-08-31 DIAGNOSIS — F5101 Primary insomnia: Secondary | ICD-10-CM

## 2023-08-31 DIAGNOSIS — N951 Menopausal and female climacteric states: Secondary | ICD-10-CM

## 2023-08-31 DIAGNOSIS — F988 Other specified behavioral and emotional disorders with onset usually occurring in childhood and adolescence: Secondary | ICD-10-CM

## 2023-09-03 ENCOUNTER — Encounter: Payer: Self-pay | Admitting: Nurse Practitioner

## 2023-10-17 ENCOUNTER — Other Ambulatory Visit: Payer: Self-pay | Admitting: Nurse Practitioner

## 2023-10-17 DIAGNOSIS — G4709 Other insomnia: Secondary | ICD-10-CM

## 2023-10-17 DIAGNOSIS — F411 Generalized anxiety disorder: Secondary | ICD-10-CM

## 2023-10-23 ENCOUNTER — Ambulatory Visit (INDEPENDENT_AMBULATORY_CARE_PROVIDER_SITE_OTHER): Payer: BLUE CROSS/BLUE SHIELD | Admitting: Nurse Practitioner

## 2023-10-23 ENCOUNTER — Encounter: Payer: Self-pay | Admitting: Nurse Practitioner

## 2023-10-23 VITALS — BP 150/88 | HR 100 | Temp 98.0°F | Resp 16 | Ht <= 58 in | Wt 178.2 lb

## 2023-10-23 DIAGNOSIS — F411 Generalized anxiety disorder: Secondary | ICD-10-CM

## 2023-10-23 DIAGNOSIS — K760 Fatty (change of) liver, not elsewhere classified: Secondary | ICD-10-CM

## 2023-10-23 DIAGNOSIS — G4709 Other insomnia: Secondary | ICD-10-CM | POA: Diagnosis not present

## 2023-10-23 DIAGNOSIS — F988 Other specified behavioral and emotional disorders with onset usually occurring in childhood and adolescence: Secondary | ICD-10-CM

## 2023-10-23 DIAGNOSIS — I10 Essential (primary) hypertension: Secondary | ICD-10-CM

## 2023-10-23 MED ORDER — AMPHETAMINE-DEXTROAMPHETAMINE 30 MG PO TABS: 30.0000 mg | ORAL_TABLET | Freq: Two times a day (BID) | ORAL | 0 refills | Status: DC

## 2023-10-23 MED ORDER — ALPRAZOLAM 0.5 MG PO TABS: 0.5000 mg | ORAL_TABLET | Freq: Two times a day (BID) | ORAL | 2 refills | Status: DC | PRN

## 2023-10-23 NOTE — Progress Notes (Signed)
 [facility] [address] [place], [place] [address]  Internal MEDICINE  Office Visit Note  Patient Name: Megan [name]  [id number]  [id number]  Date of Service: 10/23/2023  Chief Complaint  Patient presents with   Follow-up    Stomach messed up    HPI Megan [name] presents for a follow-up visit for ADHD, anxiety, insomnia and upset stomach.  Having abdominal pain, queasiness, nausea, fatigue. Symptoms noticed over the past 2 days. Denies any vomiting, diarrhea or constipation. Probably just a virus but patient will test for covid if symptoms continue. Had a sore throat a few days ago as well ADHD -- current dose remains effective. Elevated BP today but patient did not take any of her medications today due to feeling nauseated with upset stomach. Denies any palpitations or other adverse side effects of the medication, due for refills today.  Anxiety and insomnia -- takes alprazolam twice daily as needed, no issues, due for refills.  Hypertension -- BP is well controlled with current medication but BP is elevated todaty since she did not take her BP medication.  Does not have polycythemia vera per hematology. Diagnosed with fatty liver disease, taking metformin now.     Current Medication: Outpatient Encounter Medications as of 10/23/2023  Medication Sig   amLODipine (NORVASC) 10 MG tablet Take 1 tablet (10 mg total) by mouth daily.   cloNIDine (CATAPRES) 0.1 MG tablet TAKE 1 TABLET BY MOUTH EVERYDAY AT BEDTIME   metFORMIN (GLUCOPHAGE-XR) 500 MG 24 hr tablet Take by mouth.   TAURINE PO Take by mouth.   triamcinolone (NASACORT) 55 MCG/ACT AERO nasal inhaler Place 2 sprays into the nose daily.   valACYclovir (VALTREX) 500 MG tablet TAKE 1 TABLET BY MOUTH TWICE A DAY   [DISCONTINUED] ALPRAZolam (XANAX) 0.5 MG tablet Take 1 tablet (0.5 mg total) by mouth 2 (two) times daily as needed for anxiety.   [DISCONTINUED] amphetamine-dextroamphetamine (ADDERALL) 30 MG tablet Take 1  tablet by mouth 2 (two) times daily. Do not take 2nd dose after 2 pm.   [DISCONTINUED] amphetamine-dextroamphetamine (ADDERALL) 30 MG tablet Take 1 tablet by mouth 2 (two) times daily. Do not take 2nd dose after 2 pm.   [DISCONTINUED] amphetamine-dextroamphetamine (ADDERALL) 30 MG tablet Take 1 tablet by mouth 2 (two) times daily. Do not take 2nd dose after 2 pm.   ALPRAZolam (XANAX) 0.5 MG tablet Take 1 tablet (0.5 mg total) by mouth 2 (two) times daily as needed for anxiety.   [START ON 12/18/2023] amphetamine-dextroamphetamine (ADDERALL) 30 MG tablet Take 1 tablet by mouth 2 (two) times daily. Do not take 2nd dose after 2 pm.   [START ON 11/20/2023] amphetamine-dextroamphetamine (ADDERALL) 30 MG tablet Take 1 tablet by mouth 2 (two) times daily. Do not take 2nd dose after 2 pm.   amphetamine-dextroamphetamine (ADDERALL) 30 MG tablet Take 1 tablet by mouth 2 (two) times daily. Do not take 2nd dose after 2 pm.   No facility-administered encounter medications on file as of 10/23/2023.    Surgical History: Past Surgical History:  Procedure Laterality Date   BREAST BIOPSY Left 2002   benign   BUNIONECTOMY Right    CESAREAN SECTION     COLONOSCOPY N/A 01/10/2022   Procedure: COLONOSCOPY;  Surgeon: Midge Minium, MD;  Location: [facility];  Service: Endoscopy;  Laterality: N/A;   ESOPHAGOGASTRODUODENOSCOPY N/A 01/10/2022   Procedure: ESOPHAGOGASTRODUODENOSCOPY (EGD);  Surgeon: Midge Minium, MD;  Location: [facility];  Service: Endoscopy;  Laterality: N/A;  Latex  Medical History: Past Medical History:  Diagnosis Date   ADD (attention deficit disorder)    Arthritis    feet   Family history of adverse reaction to anesthesia    Sister - MI during knee surgery   Motion sickness    Sea Sick   Pre-hypertension    Wears contact lenses     Family History: Family History  Problem Relation Age of Onset   Lung cancer Father    Diabetes Sister    Breast cancer Neg Hx      Social History   Socioeconomic History   Marital status: Married    Spouse name: Not on file   Number of children: Not on file   Years of education: Not on file   Highest education level: Not on file  Occupational History   Not on file  Tobacco Use   Smoking status: Never   Smokeless tobacco: Never  Vaping Use   Vaping status: Never Used  Substance and Sexual Activity   Alcohol use: Yes    Comment: social   Drug use: Not Currently    Types: Marijuana    Comment: once in a while    Sexual activity: Not on file  Other Topics Concern   Not on file  Social History Narrative   Not on file   Social Drivers of Health   Financial Resource Strain: Not on file  Food Insecurity: Not on file  Transportation Needs: Not on file  Physical Activity: Not on file  Stress: Not on file  Social Connections: Not on file  Intimate Partner Violence: Not on file      Review of Systems  Constitutional:  Positive for fatigue.  HENT: Negative.    Respiratory: Negative.  Negative for cough, chest tightness, shortness of breath and wheezing.   Cardiovascular: Negative.  Negative for chest pain and palpitations.  Gastrointestinal:  Positive for abdominal pain and nausea. Negative for constipation, diarrhea and vomiting.  Musculoskeletal:  Positive for arthralgias.  Psychiatric/Behavioral:  Positive for decreased concentration and sleep disturbance. Negative for self-injury and suicidal ideas. The patient is nervous/anxious.     Vital Signs: BP (!) 150/88   Pulse 100   Temp 98 F (36.7 C)   Resp 16   Ht 4\' 10"  (1.473 m)   Wt 178 lb 3.2 oz (80.8 kg)   SpO2 97%   BMI 37.24 kg/m    Physical Exam Vitals reviewed.  Constitutional:      General: She is not in acute distress.    Appearance: Normal appearance. She is obese. She is ill-appearing.  HENT:     Head: Normocephalic and atraumatic.  Eyes:     Pupils: Pupils are equal, round, and reactive to light.  Cardiovascular:      Rate and Rhythm: Normal rate and regular rhythm.  Pulmonary:     Effort: Pulmonary effort is normal. No respiratory distress.  Abdominal:     General: Bowel sounds are normal. There is distension.     Palpations: Abdomen is soft.     Tenderness: There is no guarding.     Hernia: No hernia is present.  Neurological:     Mental Status: She is alert and oriented to person, place, and time.  Psychiatric:        Mood and Affect: Mood normal.        Behavior: Behavior normal.        Assessment/Plan: 1. NAFLD (nonalcoholic fatty liver disease) (Primary) Started metformin, continue as prescribed.  2. Essential hypertension Stable when taking her medications, emphasized importance of taking her medications as prescribed. Continue amlodipine as prescribed. Take clonidine as needed for elevated BP.   3. Other insomnia Continue alprazolam as prescribed. Follow up in 3 months for additional refills. UDS due at next office visit.  - ALPRAZolam (XANAX) 0.5 MG tablet; Take 1 tablet (0.5 mg total) by mouth 2 (two) times daily as needed for anxiety.  Dispense: 60 tablet; Refill: 2  4. GAD (generalized anxiety disorder) Continue alprazolam as prescribed.  - ALPRAZolam (XANAX) 0.5 MG tablet; Take 1 tablet (0.5 mg total) by mouth 2 (two) times daily as needed for anxiety.  Dispense: 60 tablet; Refill: 2  5. Attention deficit disorder (ADD) without hyperactivity Continue adderall as prescribed. Follow up in 3 months for additional refills. UDS due at next office visit.  - amphetamine-dextroamphetamine (ADDERALL) 30 MG tablet; Take 1 tablet by mouth 2 (two) times daily. Do not take 2nd dose after 2 pm.  Dispense: 60 tablet; Refill: 0 - amphetamine-dextroamphetamine (ADDERALL) 30 MG tablet; Take 1 tablet by mouth 2 (two) times daily. Do not take 2nd dose after 2 pm.  Dispense: 60 tablet; Refill: 0 - amphetamine-dextroamphetamine (ADDERALL) 30 MG tablet; Take 1 tablet by mouth 2 (two) times daily. Do  not take 2nd dose after 2 pm.  Dispense: 60 tablet; Refill: 0   General Counseling: Laberta verbalizes understanding of the findings of todays visit and agrees with plan of treatment. I have discussed any further diagnostic evaluation that may be needed or ordered today. We also reviewed her medications today. she has been encouraged to call the office with any questions or concerns that should arise related to todays visit.    No orders of the defined types were placed in this encounter.   Meds ordered this encounter  Medications   ALPRAZolam (XANAX) 0.5 MG tablet    Sig: Take 1 tablet (0.5 mg total) by mouth 2 (two) times daily as needed for anxiety.    Dispense:  60 tablet    Refill:  2    For future refills   amphetamine-dextroamphetamine (ADDERALL) 30 MG tablet    Sig: Take 1 tablet by mouth 2 (two) times daily. Do not take 2nd dose after 2 pm.    Dispense:  60 tablet    Refill:  0    Fill for april   amphetamine-dextroamphetamine (ADDERALL) 30 MG tablet    Sig: Take 1 tablet by mouth 2 (two) times daily. Do not take 2nd dose after 2 pm.    Dispense:  60 tablet    Refill:  0    Fill for march   amphetamine-dextroamphetamine (ADDERALL) 30 MG tablet    Sig: Take 1 tablet by mouth 2 (two) times daily. Do not take 2nd dose after 2 pm.    Dispense:  60 tablet    Refill:  0    Fill for february    Return in about 3 months (around 01/10/2024) for F/U, ADHD med check, Ladean Steinmeyer PCP, anxiety med refill.   Total time spent:30 Minutes Time spent includes review of chart, medications, test results, and follow up plan with the patient.   [place] Controlled Substance Database was reviewed by me.  This patient was seen by Sallyanne Kuster, FNP-C in collaboration with Dr. Beverely Risen as a part of collaborative care agreement.   Oaklyn Mans R. Tedd Sias, MSN, FNP-C Internal medicine

## 2023-10-24 ENCOUNTER — Encounter: Payer: Self-pay | Admitting: Nurse Practitioner

## 2023-12-06 ENCOUNTER — Ambulatory Visit (INDEPENDENT_AMBULATORY_CARE_PROVIDER_SITE_OTHER): Admitting: Nurse Practitioner

## 2023-12-06 ENCOUNTER — Encounter: Payer: Self-pay | Admitting: Nurse Practitioner

## 2023-12-06 VITALS — BP 130/86 | HR 113 | Temp 98.5°F | Resp 16 | Ht <= 58 in | Wt 174.0 lb

## 2023-12-06 DIAGNOSIS — G44019 Episodic cluster headache, not intractable: Secondary | ICD-10-CM

## 2023-12-06 MED ORDER — RIZATRIPTAN BENZOATE 10 MG PO TBDP: 10.0000 mg | ORAL_TABLET | ORAL | 5 refills | Status: DC | PRN

## 2023-12-06 NOTE — Progress Notes (Signed)
 [facility] [address] [place], [place] [address]  Internal MEDICINE  Office Visit Note  Patient Name: Megan [name]  [id number]  [id number]  Date of Service: 12/06/2023  Chief Complaint  Patient presents with   Acute Visit    Cluster headache     HPI Megan [name] presents for an acute sick visit for cluster headaches  --onset was in the past week, first one happened a couple of months ago.  --reports that it start in the back of her head and goes behind the left eye causing shooting pains and a fine rash forms on her forehead.      Current Medication:  Outpatient Encounter Medications as of 12/06/2023  Medication Sig   ALPRAZolam (XANAX) 0.5 MG tablet Take 1 tablet (0.5 mg total) by mouth 2 (two) times daily as needed for anxiety.   amLODipine (NORVASC) 10 MG tablet Take 1 tablet (10 mg total) by mouth daily.   amphetamine-dextroamphetamine (ADDERALL) 30 MG tablet Take 1 tablet by mouth 2 (two) times daily. Do not take 2nd dose after 2 pm.   amphetamine-dextroamphetamine (ADDERALL) 30 MG tablet Take 1 tablet by mouth 2 (two) times daily. Do not take 2nd dose after 2 pm.   amphetamine-dextroamphetamine (ADDERALL) 30 MG tablet Take 1 tablet by mouth 2 (two) times daily. Do not take 2nd dose after 2 pm.   cloNIDine (CATAPRES) 0.1 MG tablet TAKE 1 TABLET BY MOUTH EVERYDAY AT BEDTIME   metFORMIN (GLUCOPHAGE-XR) 500 MG 24 hr tablet Take by mouth.   rizatriptan (MAXALT-MLT) 10 MG disintegrating tablet Take 1 tablet (10 mg total) by mouth as needed for migraine. May repeat in 2 hours if needed   TAURINE PO Take by mouth.   triamcinolone (NASACORT) 55 MCG/ACT AERO nasal inhaler Place 2 sprays into the nose daily.   valACYclovir (VALTREX) 500 MG tablet TAKE 1 TABLET BY MOUTH TWICE A DAY   No facility-administered encounter medications on file as of 12/06/2023.      Medical History: Past Medical History:  Diagnosis Date   ADD (attention deficit disorder)     Arthritis    feet   Family history of adverse reaction to anesthesia    Sister - MI during knee surgery   Motion sickness    Sea Sick   Pre-hypertension    Wears contact lenses      Vital Signs: BP 130/86   Pulse (!) 113   Temp 98.5 F (36.9 C)   Resp 16   Ht 4\' 10"  (1.473 m)   Wt 174 lb (78.9 kg)   SpO2 97%   BMI 36.37 kg/m    Review of Systems  Constitutional:  Positive for fatigue.  Eyes:  Positive for pain.  Respiratory: Negative.  Negative for cough, chest tightness, shortness of breath and wheezing.   Cardiovascular: Negative.  Negative for chest pain and palpitations.  Musculoskeletal: Negative.   Neurological:  Positive for headaches.    Physical Exam Vitals reviewed.  Constitutional:      Appearance: Normal appearance. She is obese. She is not ill-appearing.  HENT:     Head: Normocephalic and atraumatic.  Eyes:     Pupils: Pupils are equal, round, and reactive to light.  Cardiovascular:     Rate and Rhythm: Normal rate and regular rhythm.  Pulmonary:     Effort: Pulmonary effort is normal. No respiratory distress.  Neurological:     Mental Status: She is alert and oriented to person, place, and time.  Psychiatric:  Mood and Affect: Mood normal.        Behavior: Behavior normal.       Assessment/Plan: 1. Episodic cluster headache, not intractable (Primary) Take rizatriptan as prescribed for acute cluster headache - rizatriptan (MAXALT-MLT) 10 MG disintegrating tablet; Take 1 tablet (10 mg total) by mouth as needed for migraine. May repeat in 2 hours if needed  Dispense: 10 tablet; Refill: 5   General Counseling: Megan [name] verbalizes understanding of the findings of todays visit and agrees with plan of treatment. I have discussed any further diagnostic evaluation that may be needed or ordered today. We also reviewed her medications today. she has been encouraged to call the office with any questions or concerns that should arise related to  todays visit.    Counseling:    No orders of the defined types were placed in this encounter.   Meds ordered this encounter  Medications   rizatriptan (MAXALT-MLT) 10 MG disintegrating tablet    Sig: Take 1 tablet (10 mg total) by mouth as needed for migraine. May repeat in 2 hours if needed    Dispense:  10 tablet    Refill:  5    Return if symptoms worsen or fail to improve.  [place] Controlled Substance Database was reviewed by me for overdose risk score (ORS)  Time spent:30 Minutes Time spent with patient included reviewing progress notes, labs, imaging studies, and discussing plan for follow up.   This patient was seen by Laurence Pons, FNP-C in collaboration with Dr. Verneta Gone as a part of collaborative care agreement.  [name], MSN, FNP-C Internal Medicine

## 2023-12-23 ENCOUNTER — Encounter: Payer: Self-pay | Admitting: Nurse Practitioner

## 2024-01-03 DIAGNOSIS — G43001 Migraine without aura, not intractable, with status migrainosus: Secondary | ICD-10-CM | POA: Diagnosis not present

## 2024-01-03 DIAGNOSIS — M542 Cervicalgia: Secondary | ICD-10-CM | POA: Diagnosis not present

## 2024-01-03 DIAGNOSIS — M9902 Segmental and somatic dysfunction of thoracic region: Secondary | ICD-10-CM | POA: Diagnosis not present

## 2024-01-03 DIAGNOSIS — M9901 Segmental and somatic dysfunction of cervical region: Secondary | ICD-10-CM | POA: Diagnosis not present

## 2024-01-05 DIAGNOSIS — M9901 Segmental and somatic dysfunction of cervical region: Secondary | ICD-10-CM | POA: Diagnosis not present

## 2024-01-05 DIAGNOSIS — M9902 Segmental and somatic dysfunction of thoracic region: Secondary | ICD-10-CM | POA: Diagnosis not present

## 2024-01-05 DIAGNOSIS — G43001 Migraine without aura, not intractable, with status migrainosus: Secondary | ICD-10-CM | POA: Diagnosis not present

## 2024-01-05 DIAGNOSIS — M542 Cervicalgia: Secondary | ICD-10-CM | POA: Diagnosis not present

## 2024-01-08 DIAGNOSIS — M9901 Segmental and somatic dysfunction of cervical region: Secondary | ICD-10-CM | POA: Diagnosis not present

## 2024-01-08 DIAGNOSIS — G43001 Migraine without aura, not intractable, with status migrainosus: Secondary | ICD-10-CM | POA: Diagnosis not present

## 2024-01-08 DIAGNOSIS — M9902 Segmental and somatic dysfunction of thoracic region: Secondary | ICD-10-CM | POA: Diagnosis not present

## 2024-01-08 DIAGNOSIS — M542 Cervicalgia: Secondary | ICD-10-CM | POA: Diagnosis not present

## 2024-01-10 DIAGNOSIS — M9902 Segmental and somatic dysfunction of thoracic region: Secondary | ICD-10-CM | POA: Diagnosis not present

## 2024-01-10 DIAGNOSIS — M9901 Segmental and somatic dysfunction of cervical region: Secondary | ICD-10-CM | POA: Diagnosis not present

## 2024-01-10 DIAGNOSIS — M542 Cervicalgia: Secondary | ICD-10-CM | POA: Diagnosis not present

## 2024-01-10 DIAGNOSIS — G43001 Migraine without aura, not intractable, with status migrainosus: Secondary | ICD-10-CM | POA: Diagnosis not present

## 2024-01-15 ENCOUNTER — Ambulatory Visit: Payer: BLUE CROSS/BLUE SHIELD | Admitting: Nurse Practitioner

## 2024-01-17 DIAGNOSIS — G43001 Migraine without aura, not intractable, with status migrainosus: Secondary | ICD-10-CM | POA: Diagnosis not present

## 2024-01-17 DIAGNOSIS — M170 Bilateral primary osteoarthritis of knee: Secondary | ICD-10-CM | POA: Diagnosis not present

## 2024-01-17 DIAGNOSIS — M9901 Segmental and somatic dysfunction of cervical region: Secondary | ICD-10-CM | POA: Diagnosis not present

## 2024-01-17 DIAGNOSIS — M9902 Segmental and somatic dysfunction of thoracic region: Secondary | ICD-10-CM | POA: Diagnosis not present

## 2024-01-17 DIAGNOSIS — M542 Cervicalgia: Secondary | ICD-10-CM | POA: Diagnosis not present

## 2024-01-19 DIAGNOSIS — H524 Presbyopia: Secondary | ICD-10-CM | POA: Diagnosis not present

## 2024-01-23 DIAGNOSIS — K080 Exfoliation of teeth due to systemic causes: Secondary | ICD-10-CM | POA: Diagnosis not present

## 2024-01-24 DIAGNOSIS — M9901 Segmental and somatic dysfunction of cervical region: Secondary | ICD-10-CM | POA: Diagnosis not present

## 2024-01-24 DIAGNOSIS — G43001 Migraine without aura, not intractable, with status migrainosus: Secondary | ICD-10-CM | POA: Diagnosis not present

## 2024-01-24 DIAGNOSIS — M9902 Segmental and somatic dysfunction of thoracic region: Secondary | ICD-10-CM | POA: Diagnosis not present

## 2024-01-24 DIAGNOSIS — M542 Cervicalgia: Secondary | ICD-10-CM | POA: Diagnosis not present

## 2024-01-25 ENCOUNTER — Encounter: Payer: Self-pay | Admitting: Nurse Practitioner

## 2024-01-25 ENCOUNTER — Ambulatory Visit: Admitting: Nurse Practitioner

## 2024-01-25 VITALS — BP 135/85 | HR 98 | Temp 100.0°F | Resp 16 | Ht <= 58 in | Wt 172.0 lb

## 2024-01-25 DIAGNOSIS — Z23 Encounter for immunization: Secondary | ICD-10-CM

## 2024-01-25 DIAGNOSIS — N951 Menopausal and female climacteric states: Secondary | ICD-10-CM

## 2024-01-25 DIAGNOSIS — Z6835 Body mass index (BMI) 35.0-35.9, adult: Secondary | ICD-10-CM

## 2024-01-25 DIAGNOSIS — F5101 Primary insomnia: Secondary | ICD-10-CM

## 2024-01-25 DIAGNOSIS — E66812 Obesity, class 2: Secondary | ICD-10-CM

## 2024-01-25 DIAGNOSIS — K760 Fatty (change of) liver, not elsewhere classified: Secondary | ICD-10-CM | POA: Diagnosis not present

## 2024-01-25 DIAGNOSIS — G44019 Episodic cluster headache, not intractable: Secondary | ICD-10-CM

## 2024-01-25 DIAGNOSIS — F988 Other specified behavioral and emotional disorders with onset usually occurring in childhood and adolescence: Secondary | ICD-10-CM

## 2024-01-25 DIAGNOSIS — F411 Generalized anxiety disorder: Secondary | ICD-10-CM

## 2024-01-25 MED ORDER — CLONIDINE HCL 0.1 MG PO TABS: ORAL_TABLET | ORAL | 1 refills | Status: DC

## 2024-01-25 MED ORDER — RIZATRIPTAN BENZOATE 10 MG PO TBDP: 10.0000 mg | ORAL_TABLET | ORAL | 5 refills | Status: DC | PRN

## 2024-01-25 MED ORDER — PNEUMOCOCCAL 20-VAL CONJ VACC 0.5 ML IM SUSY: 0.5000 mL | PREFILLED_SYRINGE | Freq: Once | INTRAMUSCULAR | 0 refills | Status: DC | PRN

## 2024-01-25 MED ORDER — ALPRAZOLAM 0.5 MG PO TABS: 0.5000 mg | ORAL_TABLET | Freq: Two times a day (BID) | ORAL | 2 refills | Status: DC | PRN

## 2024-01-25 MED ORDER — AMPHETAMINE-DEXTROAMPHETAMINE 30 MG PO TABS: 30.0000 mg | ORAL_TABLET | Freq: Two times a day (BID) | ORAL | 0 refills | Status: DC

## 2024-01-25 MED ORDER — AMPHETAMINE-DEXTROAMPHETAMINE 30 MG PO TABS
30.0000 mg | ORAL_TABLET | Freq: Two times a day (BID) | ORAL | 0 refills | Status: DC
Start: 2024-01-25 — End: 2024-05-15

## 2024-01-25 MED ORDER — ZEPBOUND 5 MG/0.5ML ~~LOC~~ SOLN: 5.0000 mg | SUBCUTANEOUS | 5 refills | Status: DC

## 2024-01-25 MED ORDER — AMPHETAMINE-DEXTROAMPHETAMINE 30 MG PO TABS
30.0000 mg | ORAL_TABLET | Freq: Two times a day (BID) | ORAL | 0 refills | Status: DC
Start: 2024-01-25 — End: 2024-02-19

## 2024-01-25 NOTE — Progress Notes (Signed)
 [facility] [address] [place], [place] [address]  Internal MEDICINE  Office Visit Note  Patient Name: Megan [name]  [id number]  [id number]  Date of Service: 01/25/2024  Chief Complaint  Patient presents with   Follow-up   ADHD    HPI [name] presents for a follow-up visit for obesity, ADHD, anxiety, refills.  Obesity -- was started on metformin which she is tolerating well. Wants to try zepbound via [facility] pharmacy.  ADHD -- heart rate and blood pressure are stable. Current dose is effective. Denies any palpitations or other adverse side effects of the medications. Due for refills Anxiety -- taking alprazolam  as needed, no issues. Due for refills. Annual wellness visit was cancelled due to being out of town and not flying back in time for appt. Will schedule this for next visit.  Fatty liver -- weight loss will help improve fatty liver and GLP1 RA medications are beneficial to fatty liver as well.  Wants to get pneumonia vaccine, flu vaccine during next season and the next covid vaccine when it is available.     Current Medication: Outpatient Encounter Medications as of 01/25/2024  Medication Sig   amLODipine  (NORVASC ) 10 MG tablet Take 1 tablet (10 mg total) by mouth daily.   metFORMIN (GLUCOPHAGE-XR) 500 MG 24 hr tablet Take by mouth.   pneumococcal 20-valent conjugate vaccine (PREVNAR 20) 0.5 ML injection Inject 0.5 mLs into the muscle once as needed for up to 1 dose for immunization.   TAURINE PO Take by mouth.   tirzepatide (ZEPBOUND) 5 MG/0.5ML injection vial Inject 5 mg into the skin once a week.   triamcinolone  (NASACORT ) 55 MCG/ACT AERO nasal inhaler Place 2 sprays into the nose daily.   valACYclovir  (VALTREX ) 500 MG tablet TAKE 1 TABLET BY MOUTH TWICE A DAY   [DISCONTINUED] ALPRAZolam  (XANAX ) 0.5 MG tablet Take 1 tablet (0.5 mg total) by mouth 2 (two) times daily as needed for anxiety.   [DISCONTINUED] amphetamine -dextroamphetamine   (ADDERALL) 30 MG tablet Take 1 tablet by mouth 2 (two) times daily. Do not take 2nd dose after 2 pm.   [DISCONTINUED] amphetamine -dextroamphetamine  (ADDERALL) 30 MG tablet Take 1 tablet by mouth 2 (two) times daily. Do not take 2nd dose after 2 pm.   [DISCONTINUED] amphetamine -dextroamphetamine  (ADDERALL) 30 MG tablet Take 1 tablet by mouth 2 (two) times daily. Do not take 2nd dose after 2 pm.   [DISCONTINUED] cloNIDine  (CATAPRES ) 0.1 MG tablet TAKE 1 TABLET BY MOUTH EVERYDAY AT BEDTIME   [DISCONTINUED] rizatriptan  (MAXALT -MLT) 10 MG disintegrating tablet Take 1 tablet (10 mg total) by mouth as needed for migraine. May repeat in 2 hours if needed   ALPRAZolam  (XANAX ) 0.5 MG tablet Take 1 tablet (0.5 mg total) by mouth 2 (two) times daily as needed for anxiety.   amphetamine -dextroamphetamine  (ADDERALL) 30 MG tablet Take 1 tablet by mouth 2 (two) times daily. Do not take 2nd dose after 2 pm.   amphetamine -dextroamphetamine  (ADDERALL) 30 MG tablet Take 1 tablet by mouth 2 (two) times daily. Do not take 2nd dose after 2 pm.   amphetamine -dextroamphetamine  (ADDERALL) 30 MG tablet Take 1 tablet by mouth 2 (two) times daily. Do not take 2nd dose after 2 pm.   cloNIDine  (CATAPRES ) 0.1 MG tablet TAKE 1 TABLET BY MOUTH EVERYDAY AT BEDTIME   rizatriptan  (MAXALT -MLT) 10 MG disintegrating tablet Take 1 tablet (10 mg total) by mouth as needed for migraine. May repeat in 2 hours if needed   No facility-administered encounter medications on  file as of 01/25/2024.    Surgical History: Past Surgical History:  Procedure Laterality Date   BREAST BIOPSY Left 2002   benign   BUNIONECTOMY Right    CESAREAN SECTION     COLONOSCOPY N/A 01/10/2022   Procedure: COLONOSCOPY;  Surgeon: Marnee Sink, MD;  Location: [facility];  Service: Endoscopy;  Laterality: N/A;   ESOPHAGOGASTRODUODENOSCOPY N/A 01/10/2022   Procedure: ESOPHAGOGASTRODUODENOSCOPY (EGD);  Surgeon: Marnee Sink, MD;  Location: [facility];   Service: Endoscopy;  Laterality: N/A;  Latex    Medical History: Past Medical History:  Diagnosis Date   ADD (attention deficit disorder)    Arthritis    feet   Family history of adverse reaction to anesthesia    Sister - MI during knee surgery   Motion sickness    Sea Sick   Pre-hypertension    Wears contact lenses     Family History: Family History  Problem Relation Age of Onset   Lung cancer Father    Diabetes Sister    Breast cancer Neg Hx     Social History   Socioeconomic History   Marital status: Married    Spouse name: Not on file   Number of children: Not on file   Years of education: Not on file   Highest education level: Not on file  Occupational History   Not on file  Tobacco Use   Smoking status: Never   Smokeless tobacco: Never  Vaping Use   Vaping status: Never Used  Substance and Sexual Activity   Alcohol use: Yes    Comment: social   Drug use: Not Currently    Types: Marijuana    Comment: once in a while    Sexual activity: Not on file  Other Topics Concern   Not on file  Social History Narrative   Not on file   Social Drivers of Health   Financial Resource Strain: Not on file  Food Insecurity: Not on file  Transportation Needs: Not on file  Physical Activity: Not on file  Stress: Not on file  Social Connections: Not on file  Intimate Partner Violence: Not on file      Review of Systems  Constitutional:  Positive for fatigue.  HENT: Negative.    Respiratory: Negative.  Negative for cough, chest tightness, shortness of breath and wheezing.   Cardiovascular: Negative.  Negative for chest pain and palpitations.  Gastrointestinal:  Positive for abdominal pain and nausea. Negative for constipation, diarrhea and vomiting.  Musculoskeletal:  Positive for arthralgias.  Psychiatric/Behavioral:  Positive for decreased concentration and sleep disturbance. Negative for self-injury and suicidal ideas. The patient is nervous/anxious.      Vital Signs: BP 135/85 Comment: 140/90  Pulse 98   Temp 100 F (37.8 C)   Resp 16   Ht 4\' 10"  (1.473 m)   Wt 172 lb (78 kg)   SpO2 96%   BMI 35.95 kg/m    Physical Exam Vitals reviewed.  Constitutional:      General: She is not in acute distress.    Appearance: Normal appearance. She is obese. She is ill-appearing.  HENT:     Head: Normocephalic and atraumatic.  Eyes:     Pupils: Pupils are equal, round, and reactive to light.  Cardiovascular:     Rate and Rhythm: Normal rate and regular rhythm.  Pulmonary:     Effort: Pulmonary effort is normal. No respiratory distress.  Abdominal:     General: Bowel sounds are normal. There  is no distension.     Palpations: Abdomen is soft.     Tenderness: There is no guarding.     Hernia: No hernia is present.  Skin:    General: Skin is warm and dry.     Capillary Refill: Capillary refill takes less than 2 seconds.  Neurological:     Mental Status: She is alert and oriented to person, place, and time.  Psychiatric:        Mood and Affect: Mood normal.        Behavior: Behavior normal.        Assessment/Plan: 1. NAFLD (nonalcoholic fatty liver disease) (Primary) Zepbound prescribed.  - tirzepatide (ZEPBOUND) 5 MG/0.5ML injection vial; Inject 5 mg into the skin once a week.  Dispense: 2 mL; Refill: 5  2. Vasomotor symptoms due to menopause Continue clonidine  as prescribed.  - cloNIDine  (CATAPRES ) 0.1 MG tablet; TAKE 1 TABLET BY MOUTH EVERYDAY AT BEDTIME  Dispense: 90 tablet; Refill: 1  3. Episodic cluster headache, not intractable Continue prn rizatriptan  as prescribed.  - rizatriptan  (MAXALT -MLT) 10 MG disintegrating tablet; Take 1 tablet (10 mg total) by mouth as needed for migraine. May repeat in 2 hours if needed  Dispense: 10 tablet; Refill: 5  4. Class 2 severe obesity due to excess calories with serious comorbidity and body mass index (BMI) of 35.0 to 35.9 in adult [facility]) Sample given, will try zepbound as  prescribed. - tirzepatide (ZEPBOUND) 5 MG/0.5ML injection vial; Inject 5 mg into the skin once a week.  Dispense: 2 mL; Refill: 5  5. Need for vaccination - pneumococcal 20-valent conjugate vaccine (PREVNAR 20) 0.5 ML injection; Inject 0.5 mLs into the muscle once as needed for up to 1 dose for immunization.  Dispense: 0.5 mL; Refill: 0  6. GAD (generalized anxiety disorder) Continue alprazolam  as prescribed. Follow up in 3 months for additional refills.  - ALPRAZolam  (XANAX ) 0.5 MG tablet; Take 1 tablet (0.5 mg total) by mouth 2 (two) times daily as needed for anxiety.  Dispense: 60 tablet; Refill: 2  7. Primary insomnia Continue alprazolam  and clonidine  as prescribed. Follow up in 3 months for additional refills of alprazolam .  - ALPRAZolam  (XANAX ) 0.5 MG tablet; Take 1 tablet (0.5 mg total) by mouth 2 (two) times daily as needed for anxiety.  Dispense: 60 tablet; Refill: 2 - cloNIDine  (CATAPRES ) 0.1 MG tablet; TAKE 1 TABLET BY MOUTH EVERYDAY AT BEDTIME  Dispense: 90 tablet; Refill: 1  8. Attention deficit disorder (ADD) without hyperactivity Continue adderall as prescribed. Follow up in 3 months for additional refills.  - amphetamine -dextroamphetamine  (ADDERALL) 30 MG tablet; Take 1 tablet by mouth 2 (two) times daily. Do not take 2nd dose after 2 pm.  Dispense: 60 tablet; Refill: 0 - amphetamine -dextroamphetamine  (ADDERALL) 30 MG tablet; Take 1 tablet by mouth 2 (two) times daily. Do not take 2nd dose after 2 pm.  Dispense: 60 tablet; Refill: 0 - amphetamine -dextroamphetamine  (ADDERALL) 30 MG tablet; Take 1 tablet by mouth 2 (two) times daily. Do not take 2nd dose after 2 pm.  Dispense: 60 tablet; Refill: 0   General Counseling: Reann verbalizes understanding of the findings of todays visit and agrees with plan of treatment. I have discussed any further diagnostic evaluation that may be needed or ordered today. We also reviewed her medications today. she has been encouraged to call the  office with any questions or concerns that should arise related to todays visit.    No orders of the defined types were placed in  this encounter.   Meds ordered this encounter  Medications   tirzepatide (ZEPBOUND) 5 MG/0.5ML injection vial    Sig: Inject 5 mg into the skin once a week.    Dispense:  2 mL    Refill:  5    Dx code E66.01   ALPRAZolam  (XANAX ) 0.5 MG tablet    Sig: Take 1 tablet (0.5 mg total) by mouth 2 (two) times daily as needed for anxiety.    Dispense:  60 tablet    Refill:  2    For future refills   amphetamine -dextroamphetamine  (ADDERALL) 30 MG tablet    Sig: Take 1 tablet by mouth 2 (two) times daily. Do not take 2nd dose after 2 pm.    Dispense:  60 tablet    Refill:  0    Fill for march   amphetamine -dextroamphetamine  (ADDERALL) 30 MG tablet    Sig: Take 1 tablet by mouth 2 (two) times daily. Do not take 2nd dose after 2 pm.    Dispense:  60 tablet    Refill:  0    Fill for february   amphetamine -dextroamphetamine  (ADDERALL) 30 MG tablet    Sig: Take 1 tablet by mouth 2 (two) times daily. Do not take 2nd dose after 2 pm.    Dispense:  60 tablet    Refill:  0    Fill for april   cloNIDine  (CATAPRES ) 0.1 MG tablet    Sig: TAKE 1 TABLET BY MOUTH EVERYDAY AT BEDTIME    Dispense:  90 tablet    Refill:  1   rizatriptan  (MAXALT -MLT) 10 MG disintegrating tablet    Sig: Take 1 tablet (10 mg total) by mouth as needed for migraine. May repeat in 2 hours if needed    Dispense:  10 tablet    Refill:  5   pneumococcal 20-valent conjugate vaccine (PREVNAR 20) 0.5 ML injection    Sig: Inject 0.5 mLs into the muscle once as needed for up to 1 dose for immunization.    Dispense:  0.5 mL    Refill:  0    Return in about 3 months (around 04/17/2024) for AWV, Lilleigh Hechavarria PCP, ADHD med check, anxiety med refill.   Total time spent:30 Minutes Time spent includes review of chart, medications, test results, and follow up plan with the patient.   [place] Controlled Substance  Database was reviewed by me.  This patient was seen by Laurence Pons, FNP-C in collaboration with Dr. Verneta Gone as a part of collaborative care agreement.   Clary Meeker R. Bobbi Burow, MSN, FNP-C Internal medicine

## 2024-01-26 DIAGNOSIS — M542 Cervicalgia: Secondary | ICD-10-CM | POA: Diagnosis not present

## 2024-01-26 DIAGNOSIS — G43001 Migraine without aura, not intractable, with status migrainosus: Secondary | ICD-10-CM | POA: Diagnosis not present

## 2024-01-26 DIAGNOSIS — M9902 Segmental and somatic dysfunction of thoracic region: Secondary | ICD-10-CM | POA: Diagnosis not present

## 2024-01-26 DIAGNOSIS — M9901 Segmental and somatic dysfunction of cervical region: Secondary | ICD-10-CM | POA: Diagnosis not present

## 2024-01-29 DIAGNOSIS — M542 Cervicalgia: Secondary | ICD-10-CM | POA: Diagnosis not present

## 2024-01-29 DIAGNOSIS — G43001 Migraine without aura, not intractable, with status migrainosus: Secondary | ICD-10-CM | POA: Diagnosis not present

## 2024-01-29 DIAGNOSIS — M9901 Segmental and somatic dysfunction of cervical region: Secondary | ICD-10-CM | POA: Diagnosis not present

## 2024-01-29 DIAGNOSIS — M9902 Segmental and somatic dysfunction of thoracic region: Secondary | ICD-10-CM | POA: Diagnosis not present

## 2024-01-31 DIAGNOSIS — M9901 Segmental and somatic dysfunction of cervical region: Secondary | ICD-10-CM | POA: Diagnosis not present

## 2024-01-31 DIAGNOSIS — G43001 Migraine without aura, not intractable, with status migrainosus: Secondary | ICD-10-CM | POA: Diagnosis not present

## 2024-01-31 DIAGNOSIS — M542 Cervicalgia: Secondary | ICD-10-CM | POA: Diagnosis not present

## 2024-01-31 DIAGNOSIS — M9902 Segmental and somatic dysfunction of thoracic region: Secondary | ICD-10-CM | POA: Diagnosis not present

## 2024-02-07 DIAGNOSIS — G43001 Migraine without aura, not intractable, with status migrainosus: Secondary | ICD-10-CM | POA: Diagnosis not present

## 2024-02-07 DIAGNOSIS — M542 Cervicalgia: Secondary | ICD-10-CM | POA: Diagnosis not present

## 2024-02-07 DIAGNOSIS — M9902 Segmental and somatic dysfunction of thoracic region: Secondary | ICD-10-CM | POA: Diagnosis not present

## 2024-02-07 DIAGNOSIS — M9901 Segmental and somatic dysfunction of cervical region: Secondary | ICD-10-CM | POA: Diagnosis not present

## 2024-02-10 ENCOUNTER — Encounter: Payer: Self-pay | Admitting: Nurse Practitioner

## 2024-02-12 DIAGNOSIS — M170 Bilateral primary osteoarthritis of knee: Secondary | ICD-10-CM | POA: Diagnosis not present

## 2024-02-19 ENCOUNTER — Encounter: Payer: Self-pay | Admitting: Nurse Practitioner

## 2024-02-19 ENCOUNTER — Ambulatory Visit (INDEPENDENT_AMBULATORY_CARE_PROVIDER_SITE_OTHER): Admitting: Nurse Practitioner

## 2024-02-19 VITALS — BP 118/88 | HR 90 | Temp 98.1°F | Resp 16 | Ht <= 58 in | Wt 168.0 lb

## 2024-02-19 DIAGNOSIS — E6609 Other obesity due to excess calories: Secondary | ICD-10-CM

## 2024-02-19 DIAGNOSIS — I83813 Varicose veins of bilateral lower extremities with pain: Secondary | ICD-10-CM | POA: Diagnosis not present

## 2024-02-19 DIAGNOSIS — M170 Bilateral primary osteoarthritis of knee: Secondary | ICD-10-CM | POA: Diagnosis not present

## 2024-02-19 DIAGNOSIS — B372 Candidiasis of skin and nail: Secondary | ICD-10-CM

## 2024-02-19 DIAGNOSIS — E66812 Obesity, class 2: Secondary | ICD-10-CM | POA: Diagnosis not present

## 2024-02-19 DIAGNOSIS — M9902 Segmental and somatic dysfunction of thoracic region: Secondary | ICD-10-CM | POA: Diagnosis not present

## 2024-02-19 DIAGNOSIS — M9901 Segmental and somatic dysfunction of cervical region: Secondary | ICD-10-CM | POA: Diagnosis not present

## 2024-02-19 DIAGNOSIS — Z6837 Body mass index (BMI) 37.0-37.9, adult: Secondary | ICD-10-CM

## 2024-02-19 DIAGNOSIS — F988 Other specified behavioral and emotional disorders with onset usually occurring in childhood and adolescence: Secondary | ICD-10-CM

## 2024-02-19 DIAGNOSIS — G43001 Migraine without aura, not intractable, with status migrainosus: Secondary | ICD-10-CM | POA: Diagnosis not present

## 2024-02-19 DIAGNOSIS — M542 Cervicalgia: Secondary | ICD-10-CM | POA: Diagnosis not present

## 2024-02-19 MED ORDER — CLOTRIMAZOLE-BETAMETHASONE 1-0.05 % EX CREA: 1.0000 | TOPICAL_CREAM | Freq: Every day | CUTANEOUS | 1 refills | Status: AC

## 2024-02-19 MED ORDER — AMPHETAMINE-DEXTROAMPHETAMINE 30 MG PO TABS
30.0000 mg | ORAL_TABLET | Freq: Two times a day (BID) | ORAL | 0 refills | Status: DC
Start: 2024-02-19 — End: 2024-05-15

## 2024-02-19 NOTE — Progress Notes (Signed)
 [facility] [address] [place], [place] [address]  Internal MEDICINE  Office Visit Note  Patient Name: Megan [name]  [id number]  [id number]  Date of Service: 02/19/2024  Chief Complaint  Patient presents with   Follow-up    HPI Megan [name] presents for a follow-up visit for ADHD, yeast rash, weight loss, and varicose veins.  Weight loss -- started zepbound  and is getting to the medication via mail order [facility].  Yeast rash under belly, axilla and under breasts ADHD -- taking adderall. Heart rate and BP are normal. Current dose is effective. Denies any palpitations or other adverse effects of the medication.  Varicose veins of both legs, havign pain in back of right leg.     Current Medication: Outpatient Encounter Medications as of 02/19/2024  Medication Sig   ALPRAZolam  (XANAX ) 0.5 MG tablet Take 1 tablet (0.5 mg total) by mouth 2 (two) times daily as needed for anxiety.   amLODipine  (NORVASC ) 10 MG tablet Take 1 tablet (10 mg total) by mouth daily.   amphetamine -dextroamphetamine  (ADDERALL) 30 MG tablet Take 1 tablet by mouth 2 (two) times daily. Do not take 2nd dose after 2 pm.   amphetamine -dextroamphetamine  (ADDERALL) 30 MG tablet Take 1 tablet by mouth 2 (two) times daily. Do not take 2nd dose after 2 pm.   cloNIDine  (CATAPRES ) 0.1 MG tablet TAKE 1 TABLET BY MOUTH EVERYDAY AT BEDTIME   clotrimazole -betamethasone  (LOTRISONE ) cream Apply 1 Application topically daily. To affected areas until resolved.   metFORMIN (GLUCOPHAGE-XR) 500 MG 24 hr tablet Take by mouth.   pneumococcal 20-valent conjugate vaccine (PREVNAR 20) 0.5 ML injection Inject 0.5 mLs into the muscle once as needed for up to 1 dose for immunization.   rizatriptan  (MAXALT -MLT) 10 MG disintegrating tablet Take 1 tablet (10 mg total) by mouth as needed for migraine. May repeat in 2 hours if needed   TAURINE PO Take by mouth.   tirzepatide  (ZEPBOUND ) 5 MG/0.5ML injection vial Inject 5 mg  into the skin once a week.   triamcinolone  (NASACORT ) 55 MCG/ACT AERO nasal inhaler Place 2 sprays into the nose daily.   valACYclovir  (VALTREX ) 500 MG tablet TAKE 1 TABLET BY MOUTH TWICE A DAY   [DISCONTINUED] amphetamine -dextroamphetamine  (ADDERALL) 30 MG tablet Take 1 tablet by mouth 2 (two) times daily. Do not take 2nd dose after 2 pm.   amphetamine -dextroamphetamine  (ADDERALL) 30 MG tablet Take 1 tablet by mouth 2 (two) times daily. Do not take 2nd dose after 2 pm.   No facility-administered encounter medications on file as of 02/19/2024.    Surgical History: Past Surgical History:  Procedure Laterality Date   BREAST BIOPSY Left 2002   benign   BUNIONECTOMY Right    CESAREAN SECTION     COLONOSCOPY N/A 01/10/2022   Procedure: COLONOSCOPY;  Surgeon: Jinny Carmine, MD;  Location: [facility];  Service: Endoscopy;  Laterality: N/A;   ESOPHAGOGASTRODUODENOSCOPY N/A 01/10/2022   Procedure: ESOPHAGOGASTRODUODENOSCOPY (EGD);  Surgeon: Jinny Carmine, MD;  Location: [facility];  Service: Endoscopy;  Laterality: N/A;  Latex    Medical History: Past Medical History:  Diagnosis Date   ADD (attention deficit disorder)    Arthritis    feet   Family history of adverse reaction to anesthesia    Sister - MI during knee surgery   Motion sickness    Sea Sick   Pre-hypertension    Wears contact lenses     Family History: Family History  Problem Relation Age of Onset  Lung cancer Father    Diabetes Sister    Breast cancer Neg Hx     Social History   Socioeconomic History   Marital status: Married    Spouse name: Not on file   Number of children: Not on file   Years of education: Not on file   Highest education level: Not on file  Occupational History   Not on file  Tobacco Use   Smoking status: Never   Smokeless tobacco: Never  Vaping Use   Vaping status: Never Used  Substance and Sexual Activity   Alcohol use: Yes    Comment: social   Drug use: Not Currently     Types: Marijuana    Comment: once in a while    Sexual activity: Not on file  Other Topics Concern   Not on file  Social History Narrative   Not on file   Social Drivers of Health   Financial Resource Strain: Not on file  Food Insecurity: Not on file  Transportation Needs: Not on file  Physical Activity: Not on file  Stress: Not on file  Social Connections: Not on file  Intimate Partner Violence: Not on file      Review of Systems  Constitutional:  Positive for fatigue.  HENT: Negative.    Respiratory: Negative.  Negative for cough, chest tightness, shortness of breath and wheezing.   Cardiovascular: Negative.  Negative for chest pain and palpitations.  Gastrointestinal:  Positive for abdominal pain and nausea. Negative for constipation, diarrhea and vomiting.  Musculoskeletal:  Positive for arthralgias.  Psychiatric/Behavioral:  Positive for decreased concentration and sleep disturbance. Negative for self-injury and suicidal ideas. The patient is nervous/anxious.     Vital Signs: BP 118/88   Pulse 90   Temp 98.1 F (36.7 C)   Resp 16   Ht 4' 10 (1.473 m)   Wt 168 lb (76.2 kg)   SpO2 94%   BMI 35.11 kg/m    Physical Exam Vitals reviewed.  Constitutional:      General: She is not in acute distress.    Appearance: Normal appearance. She is obese. She is ill-appearing.  HENT:     Head: Normocephalic and atraumatic.  Eyes:     Pupils: Pupils are equal, round, and reactive to light.  Cardiovascular:     Rate and Rhythm: Normal rate and regular rhythm.  Pulmonary:     Effort: Pulmonary effort is normal. No respiratory distress.  Abdominal:     General: Bowel sounds are normal. There is no distension.     Palpations: Abdomen is soft.     Tenderness: There is no guarding.     Hernia: No hernia is present.  Skin:    General: Skin is warm and dry.     Capillary Refill: Capillary refill takes less than 2 seconds.  Neurological:     Mental Status: She is  alert and oriented to person, place, and time.  Psychiatric:        Mood and Affect: Mood normal.        Behavior: Behavior normal.        Assessment/Plan: 1. Varicose veins of both lower extremities with pain (Primary) Referred to vascular surgery  - Ambulatory referral to Vascular Surgery  2. Yeast dermatitis Topical lotrisone  cream prescribed.  - clotrimazole -betamethasone  (LOTRISONE ) cream; Apply 1 Application topically daily. To affected areas until resolved.  Dispense: 45 g; Refill: 1  3. Class 2 obesity due to excess calories without serious comorbidity with body  mass index (BMI) of 37.0 to 37.9 in adult Taking zepbound  5 mg weekly, gets medication from [facility].   4. Attention deficit disorder (ADD) without hyperactivity Continue adderall as prescribed. Follow up in 3 months for additional refills.  - amphetamine -dextroamphetamine  (ADDERALL) 30 MG tablet; Take 1 tablet by mouth 2 (two) times daily. Do not take 2nd dose after 2 pm.  Dispense: 60 tablet; Refill: 0   General Counseling: Shirely verbalizes understanding of the findings of todays visit and agrees with plan of treatment. I have discussed any further diagnostic evaluation that may be needed or ordered today. We also reviewed her medications today. she has been encouraged to call the office with any questions or concerns that should arise related to todays visit.    Orders Placed This Encounter  Procedures   Ambulatory referral to Vascular Surgery    Meds ordered this encounter  Medications   clotrimazole -betamethasone  (LOTRISONE ) cream    Sig: Apply 1 Application topically daily. To affected areas until resolved.    Dispense:  45 g    Refill:  1    Fill new script today   amphetamine -dextroamphetamine  (ADDERALL) 30 MG tablet    Sig: Take 1 tablet by mouth 2 (two) times daily. Do not take 2nd dose after 2 pm.    Dispense:  60 tablet    Refill:  0    For future refill, keep on file    Return in  about 3 months (around 05/15/2024) for F/U, ADHD med check, [facility] PCP.   Total time spent:30 Minutes Time spent includes review of chart, medications, test results, and follow up plan with the patient.   [place] Controlled Substance Database was reviewed by me.  This patient was seen by Mardy Maxin, FNP-C in collaboration with Dr. Sigrid Bathe as a part of collaborative care agreement.   Nasiah Lehenbauer R. Maxin, MSN, FNP-C Internal medicine

## 2024-02-26 ENCOUNTER — Telehealth: Payer: Self-pay | Admitting: Gastroenterology

## 2024-02-26 DIAGNOSIS — M170 Bilateral primary osteoarthritis of knee: Secondary | ICD-10-CM | POA: Diagnosis not present

## 2024-02-26 NOTE — Telephone Encounter (Signed)
 Patient left a voicemail requesting to schedule an appointment with Dr. Barnie Bora, stating she is experiencing difficulty swallowing again. I returned the call and informed the patient that Dr. Barnie Bora is now a hospitalist and no longer sees patients in the outpatient clinic--he only sees inpatients and performs procedures.  I explained that she is currently established with [facility] and, per our records, was last seen by a GI provider there on 07/27/2023, which is within the past three years. I informed her that she would need to follow up with [facility] for treatment. The patient stated she saw [facility] for a different issue. I acknowledged her concern but clarified that, based on the records, she was seen by another GI specialist and would need to return to them.  When asked if Dr. Barnie Bora referred her to [facility], she stated she went on her own due to [facility] being in-network with her insurance. I advised her to contact [facility] directly to schedule a follow-up appointment. The patient became upset and ended the call abruptly.

## 2024-03-04 DIAGNOSIS — K080 Exfoliation of teeth due to systemic causes: Secondary | ICD-10-CM | POA: Diagnosis not present

## 2024-03-19 DIAGNOSIS — K080 Exfoliation of teeth due to systemic causes: Secondary | ICD-10-CM | POA: Diagnosis not present

## 2024-03-20 ENCOUNTER — Encounter: Payer: Self-pay | Admitting: Nurse Practitioner

## 2024-03-20 DIAGNOSIS — I83813 Varicose veins of bilateral lower extremities with pain: Secondary | ICD-10-CM | POA: Insufficient documentation

## 2024-04-03 ENCOUNTER — Other Ambulatory Visit: Payer: Self-pay

## 2024-04-03 DIAGNOSIS — E66812 Obesity, class 2: Secondary | ICD-10-CM

## 2024-04-03 DIAGNOSIS — K760 Fatty (change of) liver, not elsewhere classified: Secondary | ICD-10-CM

## 2024-04-03 MED ORDER — ZEPBOUND 5 MG/0.5ML ~~LOC~~ SOLN: 5.0000 mg | SUBCUTANEOUS | 5 refills | Status: DC

## 2024-04-14 ENCOUNTER — Other Ambulatory Visit: Payer: Self-pay | Admitting: Nurse Practitioner

## 2024-04-14 DIAGNOSIS — F5101 Primary insomnia: Secondary | ICD-10-CM

## 2024-04-14 DIAGNOSIS — N951 Menopausal and female climacteric states: Secondary | ICD-10-CM

## 2024-04-18 DIAGNOSIS — M170 Bilateral primary osteoarthritis of knee: Secondary | ICD-10-CM | POA: Diagnosis not present

## 2024-04-22 ENCOUNTER — Ambulatory Visit: Admitting: Nurse Practitioner

## 2024-05-01 DIAGNOSIS — G8929 Other chronic pain: Secondary | ICD-10-CM | POA: Diagnosis not present

## 2024-05-01 DIAGNOSIS — M25562 Pain in left knee: Secondary | ICD-10-CM | POA: Diagnosis not present

## 2024-05-01 DIAGNOSIS — M25561 Pain in right knee: Secondary | ICD-10-CM | POA: Diagnosis not present

## 2024-05-15 ENCOUNTER — Encounter: Payer: Self-pay | Admitting: Nurse Practitioner

## 2024-05-15 ENCOUNTER — Ambulatory Visit: Admitting: Nurse Practitioner

## 2024-05-15 VITALS — BP 135/80 | HR 100 | Temp 97.7°F | Resp 16 | Ht <= 58 in | Wt 162.0 lb

## 2024-05-15 DIAGNOSIS — G44019 Episodic cluster headache, not intractable: Secondary | ICD-10-CM

## 2024-05-15 DIAGNOSIS — I10 Essential (primary) hypertension: Secondary | ICD-10-CM

## 2024-05-15 DIAGNOSIS — F988 Other specified behavioral and emotional disorders with onset usually occurring in childhood and adolescence: Secondary | ICD-10-CM | POA: Diagnosis not present

## 2024-05-15 DIAGNOSIS — F5101 Primary insomnia: Secondary | ICD-10-CM

## 2024-05-15 DIAGNOSIS — E66811 Obesity, class 1: Secondary | ICD-10-CM | POA: Diagnosis not present

## 2024-05-15 DIAGNOSIS — Z6833 Body mass index (BMI) 33.0-33.9, adult: Secondary | ICD-10-CM | POA: Diagnosis not present

## 2024-05-15 DIAGNOSIS — Z1231 Encounter for screening mammogram for malignant neoplasm of breast: Secondary | ICD-10-CM

## 2024-05-15 DIAGNOSIS — Z23 Encounter for immunization: Secondary | ICD-10-CM | POA: Diagnosis not present

## 2024-05-15 DIAGNOSIS — F411 Generalized anxiety disorder: Secondary | ICD-10-CM

## 2024-05-15 DIAGNOSIS — Z0000 Encounter for general adult medical examination without abnormal findings: Secondary | ICD-10-CM | POA: Diagnosis not present

## 2024-05-15 DIAGNOSIS — E6609 Other obesity due to excess calories: Secondary | ICD-10-CM | POA: Diagnosis not present

## 2024-05-15 MED ORDER — AMPHETAMINE-DEXTROAMPHETAMINE 30 MG PO TABS: 30.0000 mg | ORAL_TABLET | Freq: Two times a day (BID) | ORAL | 0 refills | Status: DC

## 2024-05-15 MED ORDER — TIRZEPATIDE-WEIGHT MANAGEMENT 2.5 MG/0.5ML ~~LOC~~ SOLN
2.5000 mg | SUBCUTANEOUS | 5 refills | Status: AC
Start: 2024-05-15 — End: ?

## 2024-05-15 MED ORDER — ALPRAZOLAM 0.5 MG PO TABS: 0.5000 mg | ORAL_TABLET | Freq: Two times a day (BID) | ORAL | 2 refills | Status: DC | PRN

## 2024-05-15 MED ORDER — AMLODIPINE BESYLATE 10 MG PO TABS: 10.0000 mg | ORAL_TABLET | Freq: Every day | ORAL | 3 refills | Status: AC

## 2024-05-15 MED ORDER — RIZATRIPTAN BENZOATE 10 MG PO TBDP: 10.0000 mg | ORAL_TABLET | ORAL | 5 refills | Status: AC | PRN

## 2024-05-15 MED ORDER — NOVAVAX COVID-19 VACCINE 5 MCG/0.5ML IM SUSY: 5.0000 ug | PREFILLED_SYRINGE | Freq: Once | INTRAMUSCULAR | 0 refills | Status: AC | PRN

## 2024-05-15 MED ORDER — PNEUMOCOCCAL 20-VAL CONJ VACC 0.5 ML IM SUSY: 0.5000 mL | PREFILLED_SYRINGE | Freq: Once | INTRAMUSCULAR | 0 refills | Status: AC | PRN

## 2024-05-15 NOTE — Progress Notes (Signed)
 [facility] [address] [place], [place] [address]  Internal MEDICINE  Office Visit Note  Patient Name: Megan [name]  [id number]  [id number]  Date of Service: 05/15/2024  Chief Complaint  Patient presents with   Medicare Wellness    HPI Megan [name] presents for her initial medicare annual wellness visit.  Well-appearing 63 y.o. female with insomnia, hypertension, GAD, ADHD, arthritis, NAFLD, high cholesterol, high RBCs being monitored by hematology.  Routine CRC screening: due in 2033 Routine mammogram: due now, goes to [facility].  DEXA scan: due this year  Pap smear: due in December this year  Labs: labs were done earlier in the year and results have already been discussed, we will plan to repeat labs in January.  New or worsening pain: none  ADHD -- current dose remains effective. Her heart rate and BP are stable. She denies any palpitations or other adverse side effects. Due for refills  Anxiety -- takes alprazolam  as needed, due for refills.      05/15/2024    3:10 PM  MMSE - Mini Mental State Exam  Orientation to time 5  Orientation to Place 5  Registration 3  Attention/ Calculation 5  Recall 3  Language- name 2 objects 2  Language- repeat 1  Language- follow 3 step command 3  Language- read & follow direction 1  Write a sentence 1  Copy design 1  Total score 30    Functional Status Survey: Is the patient deaf or have difficulty hearing?: No Does the patient have difficulty seeing, even when wearing glasses/contacts?: Yes Does the patient have difficulty concentrating, remembering, or making decisions?: No Does the patient have difficulty walking or climbing stairs?: Yes Does the patient have difficulty dressing or bathing?: No Does the patient have difficulty doing errands alone such as visiting a doctor's office or shopping?: No     06/28/2022   10:12 AM 08/11/2022   12:05 PM 10/06/2022    2:28 PM 07/10/2023    1:59 PM 05/15/2024     3:08 PM  Fall Risk  Falls in the past year? 1  1 0 0  Was there an injury with Fall? 0  1 0 0  Fall Risk Category Calculator 1  2 0 0  Fall Risk Category (Retired) Low       (RETIRED) Patient Fall Risk Level  Low fall risk      Patient at Risk for Falls Due to   No Fall Risks No Fall Risks No Fall Risks  Fall risk Follow up   Falls evaluation completed Falls evaluation completed Falls evaluation completed     Data saved with a previous flowsheet row definition       05/15/2024    3:08 PM  Depression screen PHQ 2/9  Decreased Interest 0  Down, Depressed, Hopeless 0  PHQ - 2 Score 0       Current Medication: Outpatient Encounter Medications as of 05/15/2024  Medication Sig   COVID-19 Subunit Vacc-Novavax (NOVAVAX COVID-19 VACCINE ) 5 MCG/0.5ML SUSY Inject 5 mcg into the muscle once as needed for up to 1 dose (covid vaccination).   tirzepatide  (ZEPBOUND ) 2.5 MG/0.5ML injection vial Inject 2.5 mg into the skin once a week.   [DISCONTINUED] COVID-19 mRNA vaccine 2023-2024 (COMIRNATY) SUSP injection Inject 0.3 mLs into the muscle once.   [DISCONTINUED] pneumococcal 20-valent conjugate vaccine (PREVNAR 20) 0.5 ML injection Inject 0.5 mLs into the muscle once as needed for up to 1 dose for immunization.  ALPRAZolam  (XANAX ) 0.5 MG tablet Take 1 tablet (0.5 mg total) by mouth 2 (two) times daily as needed for anxiety.   amLODipine  (NORVASC ) 10 MG tablet Take 1 tablet (10 mg total) by mouth daily.   [START ON 07/10/2024] amphetamine -dextroamphetamine  (ADDERALL) 30 MG tablet Take 1 tablet by mouth 2 (two) times daily. Do not take 2nd dose after 2 pm.   [START ON 06/12/2024] amphetamine -dextroamphetamine  (ADDERALL) 30 MG tablet Take 1 tablet by mouth 2 (two) times daily. Do not take 2nd dose after 2 pm.   amphetamine -dextroamphetamine  (ADDERALL) 30 MG tablet Take 1 tablet by mouth 2 (two) times daily. Do not take 2nd dose after 2 pm.   cloNIDine  (CATAPRES ) 0.1 MG tablet TAKE 1 TABLET(0.1 MG)  BY MOUTH AT BEDTIME   clotrimazole -betamethasone  (LOTRISONE ) cream Apply 1 Application topically daily. To affected areas until resolved.   pneumococcal 20-valent conjugate vaccine (PREVNAR 20) 0.5 ML injection Inject 0.5 mLs into the muscle once as needed for up to 1 dose for immunization.   rizatriptan  (MAXALT -MLT) 10 MG disintegrating tablet Take 1 tablet (10 mg total) by mouth as needed for migraine. May repeat in 2 hours if needed   TAURINE PO Take by mouth.   triamcinolone  (NASACORT ) 55 MCG/ACT AERO nasal inhaler Place 2 sprays into the nose daily.   valACYclovir  (VALTREX ) 500 MG tablet TAKE 1 TABLET BY MOUTH TWICE A DAY   [DISCONTINUED] ALPRAZolam  (XANAX ) 0.5 MG tablet Take 1 tablet (0.5 mg total) by mouth 2 (two) times daily as needed for anxiety.   [DISCONTINUED] amLODipine  (NORVASC ) 10 MG tablet Take 1 tablet (10 mg total) by mouth daily.   [DISCONTINUED] amphetamine -dextroamphetamine  (ADDERALL) 30 MG tablet Take 1 tablet by mouth 2 (two) times daily. Do not take 2nd dose after 2 pm.   [DISCONTINUED] amphetamine -dextroamphetamine  (ADDERALL) 30 MG tablet Take 1 tablet by mouth 2 (two) times daily. Do not take 2nd dose after 2 pm.   [DISCONTINUED] amphetamine -dextroamphetamine  (ADDERALL) 30 MG tablet Take 1 tablet by mouth 2 (two) times daily. Do not take 2nd dose after 2 pm.   [DISCONTINUED] metFORMIN (GLUCOPHAGE-XR) 500 MG 24 hr tablet Take by mouth.   [DISCONTINUED] rizatriptan  (MAXALT -MLT) 10 MG disintegrating tablet Take 1 tablet (10 mg total) by mouth as needed for migraine. May repeat in 2 hours if needed   [DISCONTINUED] tirzepatide  (ZEPBOUND ) 5 MG/0.5ML injection vial Inject 5 mg into the skin once a week.   No facility-administered encounter medications on file as of 05/15/2024.    Surgical History: Past Surgical History:  Procedure Laterality Date   BREAST BIOPSY Left 2002   benign   BUNIONECTOMY Right    CESAREAN SECTION     COLONOSCOPY N/A 01/10/2022   Procedure:  COLONOSCOPY;  Surgeon: Jinny Carmine, MD;  Location[facility];  Service: Endoscopy;  Laterality: N/A;   ESOPHAGOGASTRODUODENOSCOPY N/A 01/10/2022   Procedure: ESOPHAGOGASTRODUODENOSCOPY (EGD);  Surgeon: Jinny Carmine, MD;  Location: [facility];  Service: Endoscopy;  Laterality: N/A;  Latex    Medical History: Past Medical History:  Diagnosis Date   ADD (attention deficit disorder)    Arthritis    feet   Family history of adverse reaction to anesthesia    Sister - MI during knee surgery   Motion sickness    Sea Sick   Pre-hypertension    Wears contact lenses     Family History: Family History  Problem Relation Age of Onset   Lung cancer Father    Diabetes Sister    Breast cancer  Neg Hx     Social History   Socioeconomic History   Marital status: Married    Spouse name: Not on file   Number of children: Not on file   Years of education: Not on file   Highest education level: Not on file  Occupational History   Not on file  Tobacco Use   Smoking status: Never   Smokeless tobacco: Never  Vaping Use   Vaping status: Never Used  Substance and Sexual Activity   Alcohol use: Yes    Comment: social   Drug use: Not Currently    Types: Marijuana    Comment: once in a while    Sexual activity: Not on file  Other Topics Concern   Not on file  Social History Narrative   Not on file   Social Drivers of Health   Financial Resource Strain: Not on file  Food Insecurity: Not on file  Transportation Needs: Not on file  Physical Activity: Not on file  Stress: Not on file  Social Connections: Not on file  Intimate Partner Violence: Not on file      Review of Systems  Constitutional:  Positive for fatigue. Negative for activity change, appetite change, chills, fever and unexpected weight change.  HENT: Negative.  Negative for congestion, ear pain, postnasal drip, rhinorrhea, sore throat and trouble swallowing.   Eyes: Negative.   Respiratory: Negative.   Negative for cough, chest tightness, shortness of breath and wheezing.   Cardiovascular: Negative.  Negative for chest pain and palpitations.  Gastrointestinal: Negative.  Negative for abdominal pain, blood in stool, constipation, diarrhea, nausea and vomiting.  Endocrine: Negative.   Genitourinary: Negative.  Negative for difficulty urinating, dysuria, frequency, hematuria and urgency.  Musculoskeletal:  Positive for arthralgias and joint swelling. Negative for back pain, myalgias and neck pain.  Skin: Negative.  Negative for rash and wound.  Allergic/Immunologic: Negative.  Negative for immunocompromised state.  Neurological: Negative.  Negative for dizziness, seizures, numbness and headaches.  Hematological: Negative.   Psychiatric/Behavioral:  Positive for decreased concentration, dysphoric mood and sleep disturbance. Negative for behavioral problems, self-injury and suicidal ideas. The patient is nervous/anxious.     Vital Signs: BP 135/80 Comment: 149/91  Pulse 100   Temp 97.7 F (36.5 C)   Resp 16   Ht 4' 10 (1.473 m)   Wt 162 lb (73.5 kg)   SpO2 97%   BMI 33.86 kg/m    Physical Exam Vitals reviewed.  Constitutional:      General: She is not in acute distress.    Appearance: Normal appearance. She is well-developed. She is obese. She is not ill-appearing or diaphoretic.  HENT:     Head: Normocephalic and atraumatic.     Right Ear: Tympanic membrane, ear canal and external ear normal.     Left Ear: Tympanic membrane, ear canal and external ear normal.     Nose: Nose normal. No congestion or rhinorrhea.     Mouth/Throat:     Mouth: Mucous membranes are moist.     Pharynx: Oropharynx is clear. No oropharyngeal exudate or posterior oropharyngeal erythema.  Eyes:     Extraocular Movements: Extraocular movements intact.     Conjunctiva/sclera: Conjunctivae normal.     Pupils: Pupils are equal, round, and reactive to light.  Neck:     Thyroid: No thyromegaly.      Vascular: No JVD.     Trachea: No tracheal deviation.  Cardiovascular:     Rate and Rhythm: Normal rate  and regular rhythm.     Pulses: Normal pulses.     Heart sounds: Normal heart sounds. No murmur heard.    No friction rub. No gallop.  Pulmonary:     Effort: Pulmonary effort is normal. No respiratory distress.     Breath sounds: Normal breath sounds. No wheezing or rales.  Chest:     Chest wall: No tenderness.  Abdominal:     General: Bowel sounds are normal.     Palpations: Abdomen is soft.  Musculoskeletal:        General: Normal range of motion.     Cervical back: Normal range of motion and neck supple.     Right lower leg: No edema.     Left lower leg: No edema.  Lymphadenopathy:     Cervical: No cervical adenopathy.  Skin:    General: Skin is warm and dry.     Capillary Refill: Capillary refill takes less than 2 seconds.  Neurological:     Mental Status: She is alert and oriented to person, place, and time.     Cranial Nerves: No cranial nerve deficit.     Coordination: Coordination normal.     Gait: Gait normal.  Psychiatric:        Mood and Affect: Mood normal.        Behavior: Behavior normal.        Thought Content: Thought content normal.        Judgment: Judgment normal.        Assessment/Plan: 1. Initial Medicare annual wellness visit (Primary) Age-appropriate preventive screenings and vaccinations discussed. Routine labs for health maintenance are up to date. PHM updated.    2. Essential hypertension Stable, continue amlodipine  and clonidine  as prescribed.  - amLODipine  (NORVASC ) 10 MG tablet; Take 1 tablet (10 mg total) by mouth daily.  Dispense: 90 tablet; Refill: 3  3. Episodic cluster headache, not intractable Continue prn rizatriptan  as prescribed.  - rizatriptan  (MAXALT -MLT) 10 MG disintegrating tablet; Take 1 tablet (10 mg total) by mouth as needed for migraine. May repeat in 2 hours if needed  Dispense: 10 tablet; Refill: 5  4. Class 1  obesity due to excess calories with serious comorbidity and body mass index (BMI) of 33.0 to 33.9 in adult Patient wants to try the zepbound  vials through [facility] which is cash pay since her insurance does not cover weight loss medications.  - tirzepatide  (ZEPBOUND ) 2.5 MG/0.5ML injection vial; Inject 2.5 mg into the skin once a week.  Dispense: 2 mL; Refill: 5  5. Encounter for screening mammogram for malignant neoplasm of breast Routine mammogram ordered  - MM 3D SCREENING MAMMOGRAM BILATERAL BREAST; Future  6. Need for vaccination - pneumococcal 20-valent conjugate vaccine (PREVNAR 20) 0.5 ML injection; Inject 0.5 mLs into the muscle once as needed for up to 1 dose for immunization.  Dispense: 0.5 mL; Refill: 0 - COVID-19 Subunit Vacc-Novavax (NOVAVAX COVID-19 VACCINE ) 5 MCG/0.5ML SUSY; Inject 5 mcg into the muscle once as needed for up to 1 dose (covid vaccination).  Dispense: 0.5 mL; Refill: 0  7. Attention deficit disorder (ADD) without hyperactivity Continue adderall as prescribed. Follow up in 3 months for additional refills.  - amphetamine -dextroamphetamine  (ADDERALL) 30 MG tablet; Take 1 tablet by mouth 2 (two) times daily. Do not take 2nd dose after 2 pm.  Dispense: 60 tablet; Refill: 0 - amphetamine -dextroamphetamine  (ADDERALL) 30 MG tablet; Take 1 tablet by mouth 2 (two) times daily. Do not take 2nd dose after 2  pm.  Dispense: 60 tablet; Refill: 0 - amphetamine -dextroamphetamine  (ADDERALL) 30 MG tablet; Take 1 tablet by mouth 2 (two) times daily. Do not take 2nd dose after 2 pm.  Dispense: 60 tablet; Refill: 0  8. GAD (generalized anxiety disorder) Continue prn alprazolam  as prescribed. Follow up in 3 months for additional refills.  - ALPRAZolam  (XANAX ) 0.5 MG tablet; Take 1 tablet (0.5 mg total) by mouth 2 (two) times daily as needed for anxiety.  Dispense: 60 tablet; Refill: 2  9. Primary insomnia Continue prn alprazolam  as prescribed. Follow up in 3 months for  additional refills.  - ALPRAZolam  (XANAX ) 0.5 MG tablet; Take 1 tablet (0.5 mg total) by mouth 2 (two) times daily as needed for anxiety.  Dispense: 60 tablet; Refill: 2     General Counseling: Meisha verbalizes understanding of the findings of todays visit and agrees with plan of treatment. I have discussed any further diagnostic evaluation that may be needed or ordered today. We also reviewed her medications today. she has been encouraged to call the office with any questions or concerns that should arise related to todays visit.    Orders Placed This Encounter  Procedures   MM 3D SCREENING MAMMOGRAM BILATERAL BREAST    Meds ordered this encounter  Medications   pneumococcal 20-valent conjugate vaccine (PREVNAR 20) 0.5 ML injection    Sig: Inject 0.5 mLs into the muscle once as needed for up to 1 dose for immunization.    Dispense:  0.5 mL    Refill:  0   tirzepatide  (ZEPBOUND ) 2.5 MG/0.5ML injection vial    Sig: Inject 2.5 mg into the skin once a week.    Dispense:  2 mL    Refill:  5    Note decreased dose, fill new script today   COVID-19 Subunit Vacc-Novavax (NOVAVAX COVID-19 VACCINE ) 5 MCG/0.5ML SUSY    Sig: Inject 5 mcg into the muscle once as needed for up to 1 dose (covid vaccination).    Dispense:  0.5 mL    Refill:  0    Please administer current strain for covid 19 vaccination   amphetamine -dextroamphetamine  (ADDERALL) 30 MG tablet    Sig: Take 1 tablet by mouth 2 (two) times daily. Do not take 2nd dose after 2 pm.    Dispense:  60 tablet    Refill:  0    Fill for October 29.   amphetamine -dextroamphetamine  (ADDERALL) 30 MG tablet    Sig: Take 1 tablet by mouth 2 (two) times daily. Do not take 2nd dose after 2 pm.    Dispense:  60 tablet    Refill:  0    For October 1 refill   amphetamine -dextroamphetamine  (ADDERALL) 30 MG tablet    Sig: Take 1 tablet by mouth 2 (two) times daily. Do not take 2nd dose after 2 pm.    Dispense:  60 tablet    Refill:  0    Fill  for september   ALPRAZolam  (XANAX ) 0.5 MG tablet    Sig: Take 1 tablet (0.5 mg total) by mouth 2 (two) times daily as needed for anxiety.    Dispense:  60 tablet    Refill:  2    For future refills   rizatriptan  (MAXALT -MLT) 10 MG disintegrating tablet    Sig: Take 1 tablet (10 mg total) by mouth as needed for migraine. May repeat in 2 hours if needed    Dispense:  10 tablet    Refill:  5   amLODipine  (NORVASC ) 10  MG tablet    Sig: Take 1 tablet (10 mg total) by mouth daily.    Dispense:  90 tablet    Refill:  3    Return in about 3 months (around 08/06/2024) for F/U, ADHD med check, Kaydon Husby PCP.   Total time spent:30 Minutes Time spent includes review of chart, medications, test results, and follow up plan with the patient.   [place] Controlled Substance Database was reviewed by me.  This patient was seen by Mardy Maxin, FNP-C in collaboration with Dr. Sigrid Bathe as a part of collaborative care agreement.  Terrea Bruster R. Maxin, MSN, FNP-C Internal medicine

## 2024-05-21 ENCOUNTER — Other Ambulatory Visit (INDEPENDENT_AMBULATORY_CARE_PROVIDER_SITE_OTHER): Payer: Self-pay | Admitting: Vascular Surgery

## 2024-05-21 DIAGNOSIS — M25562 Pain in left knee: Secondary | ICD-10-CM | POA: Diagnosis not present

## 2024-05-21 DIAGNOSIS — M25561 Pain in right knee: Secondary | ICD-10-CM | POA: Diagnosis not present

## 2024-05-21 DIAGNOSIS — I83813 Varicose veins of bilateral lower extremities with pain: Secondary | ICD-10-CM

## 2024-05-21 DIAGNOSIS — G8929 Other chronic pain: Secondary | ICD-10-CM | POA: Diagnosis not present

## 2024-05-23 ENCOUNTER — Encounter (INDEPENDENT_AMBULATORY_CARE_PROVIDER_SITE_OTHER): Payer: Self-pay | Admitting: Nurse Practitioner

## 2024-05-23 ENCOUNTER — Ambulatory Visit (INDEPENDENT_AMBULATORY_CARE_PROVIDER_SITE_OTHER)

## 2024-05-23 ENCOUNTER — Ambulatory Visit (INDEPENDENT_AMBULATORY_CARE_PROVIDER_SITE_OTHER): Admitting: Nurse Practitioner

## 2024-05-23 VITALS — BP 143/81 | HR 109 | Ht <= 58 in | Wt 159.5 lb

## 2024-05-23 DIAGNOSIS — E782 Mixed hyperlipidemia: Secondary | ICD-10-CM | POA: Diagnosis not present

## 2024-05-23 DIAGNOSIS — I83813 Varicose veins of bilateral lower extremities with pain: Secondary | ICD-10-CM

## 2024-05-23 DIAGNOSIS — I10 Essential (primary) hypertension: Secondary | ICD-10-CM | POA: Diagnosis not present

## 2024-05-27 ENCOUNTER — Encounter (INDEPENDENT_AMBULATORY_CARE_PROVIDER_SITE_OTHER): Payer: Self-pay | Admitting: Nurse Practitioner

## 2024-05-27 NOTE — Progress Notes (Incomplete)
 Subjective:    Patient ID: Megan [name], female    DOB: 1961-01-09, 63 y.o.   MRN: [id number] Chief Complaint  Patient presents with  . Establish Care    Vein on right leg pain    The patient is seen for evaluation of symptomatic varicose veins. The patient relates burning and stinging which worsened steadily throughout the course of the day, particularly with standing. The patient also notes an aching and throbbing pain over the varicosities, particularly with prolonged dependent positions. The symptoms are significantly improved with elevation.  The patient also notes that during hot weather the symptoms are greatly intensified. The patient states the pain from the varicose veins interferes with work, daily exercise, shopping and household maintenance. At this point, the symptoms are persistent and severe enough that they're having a negative impact on lifestyle and are interfering with daily activities.  There is no history of DVT, PE or superficial thrombophlebitis. There is no history of ulceration or hemorrhage. The patient denies a significant family history of varicose veins.  The patient has worn graduated compression in the past. At the present time the patient has  been using over-the-counter analgesics. There is no history of prior surgical intervention or sclerotherapy.      Review of Systems  Cardiovascular:  Positive for leg swelling.  All other systems reviewed and are negative.      Objective:   Physical Exam Vitals reviewed.  HENT:     Head: Normocephalic.  Cardiovascular:     Rate and Rhythm: Normal rate.     Pulses: Normal pulses.  Pulmonary:     Effort: Pulmonary effort is normal.  Musculoskeletal:        General: Tenderness present.     Right lower leg: Edema present.     Left lower leg: Edema present.  Skin:    General: Skin is warm and dry.  Neurological:     Mental Status: She is alert and oriented to person, place, and time.   Psychiatric:        Mood and Affect: Mood normal.        Behavior: Behavior normal.        Thought Content: Thought content normal.        Judgment: Judgment normal.     BP (!) 143/81   Pulse (!) 109   Ht 4' 10 (1.473 m)   Wt 159 lb 8 oz (72.3 kg)   BMI 33.34 kg/m   Past Medical History:  Diagnosis Date  . ADD (attention deficit disorder)   . Arthritis    feet  . Family history of adverse reaction to anesthesia    Sister - MI during knee surgery  . Motion sickness    [employer]  . Pre-hypertension   . Wears contact lenses     Social History   Socioeconomic History  . Marital status: Married    Spouse name: Not on file  . Number of children: Not on file  . Years of education: Not on file  . Highest education level: Not on file  Occupational History  . Not on file  Tobacco Use  . Smoking status: Never  . Smokeless tobacco: Never  Vaping Use  . Vaping status: Never Used  Substance and Sexual Activity  . Alcohol use: Yes    Comment: social  . Drug use: Not Currently    Types: Marijuana    Comment: once in a while   . Sexual activity: Not on file  Other Topics Concern  . Not on file  Social History Narrative  . Not on file   Social Drivers of Health   Financial Resource Strain: Not on file  Food Insecurity: Not on file  Transportation Needs: Not on file  Physical Activity: Not on file  Stress: Not on file  Social Connections: Not on file  Intimate Partner Violence: Not on file    Past Surgical History:  Procedure Laterality Date  . BREAST BIOPSY Left 2002   benign  . BUNIONECTOMY Right   . CESAREAN SECTION    . COLONOSCOPY N/A 01/10/2022   Procedure: COLONOSCOPY;  Surgeon: Jinny Carmine, MD;  Location: [facility];  Service: Endoscopy;  Laterality: N/A;  . ESOPHAGOGASTRODUODENOSCOPY N/A 01/10/2022   Procedure: ESOPHAGOGASTRODUODENOSCOPY (EGD);  Surgeon: Jinny Carmine, MD;  Location: [facility];  Service: Endoscopy;  Laterality: N/A;   Latex    Family History  Problem Relation Age of Onset  . Lung cancer Father   . Diabetes Sister   . Breast cancer Neg Hx     Allergies  Allergen Reactions  . Codeine Other (See Comments)    hyper  . Latex     Irritation with mucous membrane contact  . Morphine Hives  . Morphine And Codeine Hives  . Sunflower Oil Itching    Sunflower seeds make lips itch  . Vicodin [Hydrocodone-Acetaminophen ] Other (See Comments)    Stomach pain, hyper  . Nickel Itching and Rash       Latest Ref Rng & Units 09/26/2022   10:33 AM 03/23/2022   10:10 AM 02/01/2021    9:32 AM  CBC  WBC 3.4 - 10.8 x10E3/uL 6.1  7.7  6.0   Hemoglobin 11.1 - 15.9 g/dL 83.3  83.8  85.5   Hematocrit 34.0 - 46.6 % 50.6  49.9  43.4   Platelets 150 - 450 x10E3/uL 360  333  224       CMP     Component Value Date/Time   NA 143 09/26/2022 1033   K 4.0 09/26/2022 1033   CL 100 09/26/2022 1033   CO2 25 09/26/2022 1033   GLUCOSE 94 09/26/2022 1033   GLUCOSE 113 (H) 02/01/2021 0932   BUN 13 09/26/2022 1033   CREATININE 0.73 09/26/2022 1033   CALCIUM 10.3 09/26/2022 1033   PROT 7.3 09/26/2022 1033   ALBUMIN 4.6 09/26/2022 1033   AST 24 09/26/2022 1033   ALT 31 09/26/2022 1033   ALKPHOS 95 09/26/2022 1033   BILITOT 1.0 09/26/2022 1033   EGFR 92 09/26/2022 1033   GFRNONAA >60 02/01/2021 0932     No results found.     Assessment & Plan:   1. Varicose veins of both lower extremities with pain (Primary) Recommend  I have reviewed my previous  discussion with the patient regarding  varicose veins and why they cause symptoms. Patient will continue  wearing graduated compression stockings class 1 on a daily basis, beginning first thing in the morning and removing them in the evening.  The patient is CEAP C3sEpAsPr.  The patient has been wearing compression for more than 12 weeks with no or little benefit.  The patient has been exercising daily for more than 12 weeks. The patient has been elevating and taking  OTC pain medications for more than 12 weeks.  None of these have have eliminated the pain related to the varicose veins and venous reflux or the discomfort regarding venous congestion.    In addition, behavioral modification including elevation  during the day was again discussed and this will continue.  The patient has utilized over the counter pain medications and has been exercising.  However, at this time conservative therapy has not alleviated the patient's symptoms of leg pain and swelling  Recommend: laser ablation of the right and  left great saphenous veins to eliminate the symptoms of pain and swelling of the lower extremities caused by the severe superficial venous reflux disease.   2. Mixed hyperlipidemia Continue statin as ordered and reviewed, no changes at this time  3. Essential hypertension Continue antihypertensive medications as already ordered, these medications have been reviewed and there are no changes at this time.   Current Outpatient Medications on File Prior to Visit  Medication Sig Dispense Refill  . ALPRAZolam  (XANAX ) 0.5 MG tablet Take 1 tablet (0.5 mg total) by mouth 2 (two) times daily as needed for anxiety. 60 tablet 2  . amLODipine  (NORVASC ) 10 MG tablet Take 1 tablet (10 mg total) by mouth daily. 90 tablet 3  . [START ON 07/10/2024] amphetamine -dextroamphetamine  (ADDERALL) 30 MG tablet Take 1 tablet by mouth 2 (two) times daily. Do not take 2nd dose after 2 pm. 60 tablet 0  . [START ON 06/12/2024] amphetamine -dextroamphetamine  (ADDERALL) 30 MG tablet Take 1 tablet by mouth 2 (two) times daily. Do not take 2nd dose after 2 pm. 60 tablet 0  . amphetamine -dextroamphetamine  (ADDERALL) 30 MG tablet Take 1 tablet by mouth 2 (two) times daily. Do not take 2nd dose after 2 pm. 60 tablet 0  . cloNIDine  (CATAPRES ) 0.1 MG tablet TAKE 1 TABLET(0.1 MG) BY MOUTH AT BEDTIME 90 tablet 1  . clotrimazole -betamethasone  (LOTRISONE ) cream Apply 1 Application topically daily. To  affected areas until resolved. 45 g 1  . COVID-19 Subunit Vacc-Novavax (NOVAVAX COVID-19 VACCINE ) 5 MCG/0.5ML SUSY Inject 5 mcg into the muscle once as needed for up to 1 dose (covid vaccination). 0.5 mL 0  . pneumococcal 20-valent conjugate vaccine (PREVNAR 20) 0.5 ML injection Inject 0.5 mLs into the muscle once as needed for up to 1 dose for immunization. 0.5 mL 0  . rizatriptan  (MAXALT -MLT) 10 MG disintegrating tablet Take 1 tablet (10 mg total) by mouth as needed for migraine. May repeat in 2 hours if needed 10 tablet 5  . TAURINE PO Take by mouth.    . tirzepatide  (ZEPBOUND ) 2.5 MG/0.5ML injection vial Inject 2.5 mg into the skin once a week. 2 mL 5  . triamcinolone  (NASACORT ) 55 MCG/ACT AERO nasal inhaler Place 2 sprays into the nose daily. 16.9 mL 5  . valACYclovir  (VALTREX ) 500 MG tablet TAKE 1 TABLET BY MOUTH TWICE A DAY 180 tablet 1   No current facility-administered medications on file prior to visit.    There are no Patient Instructions on file for this visit. No follow-ups on file.   Akili Corsetti E Grayce Budden, NP

## 2024-05-27 NOTE — Progress Notes (Signed)
 Subjective:    Patient ID: Megan [name], female    DOB: 1960/11/26, 63 y.o.   MRN: [id number] Chief Complaint  Patient presents with   Establish Care    Vein on right leg pain    The patient is seen for evaluation of symptomatic varicose veins. The patient relates burning and stinging which worsened steadily throughout the course of the day, particularly with standing. The patient also notes an aching and throbbing pain over the varicosities, particularly with prolonged dependent positions. The symptoms are significantly improved with elevation.  The patient also notes that during hot weather the symptoms are greatly intensified. The patient states the pain from the varicose veins interferes with work, daily exercise, shopping and household maintenance. At this point, the symptoms are persistent and severe enough that they're having a negative impact on lifestyle and are interfering with daily activities.  There is no history of DVT, PE or superficial thrombophlebitis. There is no history of ulceration or hemorrhage. The patient denies a significant family history of varicose veins.  The patient has worn graduated compression in the past. At the present time the patient has  been using over-the-counter analgesics. There is no history of prior surgical intervention or sclerotherapy.  No evidence of DVT or superficial thrombophlebitis bilaterally.  No evidence of deep venous insufficiency bilaterally.  The patient has evidence of reflux in her bilateral great saphenous veins.    Review of Systems  Cardiovascular:  Positive for leg swelling.  All other systems reviewed and are negative.      Objective:   Physical Exam Vitals reviewed.  HENT:     Head: Normocephalic.  Cardiovascular:     Rate and Rhythm: Normal rate.     Pulses: Normal pulses.  Pulmonary:     Effort: Pulmonary effort is normal.  Musculoskeletal:        General: Tenderness present.     Right lower leg:  Edema present.     Left lower leg: Edema present.  Skin:    General: Skin is warm and dry.  Neurological:     Mental Status: She is alert and oriented to person, place, and time.  Psychiatric:        Mood and Affect: Mood normal.        Behavior: Behavior normal.        Thought Content: Thought content normal.        Judgment: Judgment normal.     BP (!) 143/81   Pulse (!) 109   Ht 4' 10 (1.473 m)   Wt 159 lb 8 oz (72.3 kg)   BMI 33.34 kg/m   Past Medical History:  Diagnosis Date   ADD (attention deficit disorder)    Arthritis    feet   Family history of adverse reaction to anesthesia    Sister - MI during knee surgery   Motion sickness    [facility]   Pre-hypertension    Wears contact lenses     Social History   Socioeconomic History   Marital status: Married    Spouse name: Not on file   Number of children: Not on file   Years of education: Not on file   Highest education level: Not on file  Occupational History   Not on file  Tobacco Use   Smoking status: Never   Smokeless tobacco: Never  Vaping Use   Vaping status: Never Used  Substance and Sexual Activity   Alcohol use: Yes    Comment:  social   Drug use: Not Currently    Types: Marijuana    Comment: once in a while    Sexual activity: Not on file  Other Topics Concern   Not on file  Social History Narrative   Not on file   Social Drivers of Health   Financial Resource Strain: Not on file  Food Insecurity: Not on file  Transportation Needs: Not on file  Physical Activity: Not on file  Stress: Not on file  Social Connections: Not on file  Intimate Partner Violence: Not on file    Past Surgical History:  Procedure Laterality Date   BREAST BIOPSY Left 2002   benign   BUNIONECTOMY Right    CESAREAN SECTION     COLONOSCOPY N/A 01/10/2022   Procedure: COLONOSCOPY;  Surgeon: Jinny Carmine, MD;  Location: [facility];  Service: Endoscopy;  Laterality: N/A;   ESOPHAGOGASTRODUODENOSCOPY  N/A 01/10/2022   Procedure: ESOPHAGOGASTRODUODENOSCOPY (EGD);  Surgeon: Jinny Carmine, MD;  Location: [facility];  Service: Endoscopy;  Laterality: N/A;  Latex    Family History  Problem Relation Age of Onset   Lung cancer Father    Diabetes Sister    Breast cancer Neg Hx     Allergies  Allergen Reactions   Codeine Other (See Comments)    hyper   Latex     Irritation with mucous membrane contact   Morphine Hives   Morphine And Codeine Hives   Sunflower Oil Itching    Sunflower seeds make lips itch   Vicodin [Hydrocodone-Acetaminophen ] Other (See Comments)    Stomach pain, hyper   Nickel Itching and Rash       Latest Ref Rng & Units 09/26/2022   10:33 AM 03/23/2022   10:10 AM 02/01/2021    9:32 AM  CBC  WBC 3.4 - 10.8 x10E3/uL 6.1  7.7  6.0   Hemoglobin 11.1 - 15.9 g/dL 83.3  83.8  85.5   Hematocrit 34.0 - 46.6 % 50.6  49.9  43.4   Platelets 150 - 450 x10E3/uL 360  333  224       CMP     Component Value Date/Time   NA 143 09/26/2022 1033   K 4.0 09/26/2022 1033   CL 100 09/26/2022 1033   CO2 25 09/26/2022 1033   GLUCOSE 94 09/26/2022 1033   GLUCOSE 113 (H) 02/01/2021 0932   BUN 13 09/26/2022 1033   CREATININE 0.73 09/26/2022 1033   CALCIUM 10.3 09/26/2022 1033   PROT 7.3 09/26/2022 1033   ALBUMIN 4.6 09/26/2022 1033   AST 24 09/26/2022 1033   ALT 31 09/26/2022 1033   ALKPHOS 95 09/26/2022 1033   BILITOT 1.0 09/26/2022 1033   EGFR 92 09/26/2022 1033   GFRNONAA >60 02/01/2021 0932     No results found.     Assessment & Plan:   1. Varicose veins of both lower extremities with pain (Primary) Recommend  I have reviewed my previous  discussion with the patient regarding  varicose veins and why they cause symptoms. Patient will continue  wearing graduated compression stockings class 1 on a daily basis, beginning first thing in the morning and removing them in the evening.  The patient is CEAP C3sEpAsPr.  The patient has been wearing compression for  more than 12 weeks with no or little benefit.  The patient has been exercising daily for more than 12 weeks. The patient has been elevating and taking OTC pain medications for more than 12 weeks.  None of  these have have eliminated the pain related to the varicose veins and venous reflux or the discomfort regarding venous congestion.    In addition, behavioral modification including elevation during the day was again discussed and this will continue.  The patient has utilized over the counter pain medications and has been exercising.  However, at this time conservative therapy has not alleviated the patient's symptoms of leg pain and swelling  Recommend: laser ablation of the right and  left great saphenous veins to eliminate the symptoms of pain and swelling of the lower extremities caused by the severe superficial venous reflux disease.   2. Mixed hyperlipidemia Continue statin as ordered and reviewed, no changes at this time  3. Essential hypertension Continue antihypertensive medications as already ordered, these medications have been reviewed and there are no changes at this time.   Current Outpatient Medications on File Prior to Visit  Medication Sig Dispense Refill   ALPRAZolam  (XANAX ) 0.5 MG tablet Take 1 tablet (0.5 mg total) by mouth 2 (two) times daily as needed for anxiety. 60 tablet 2   amLODipine  (NORVASC ) 10 MG tablet Take 1 tablet (10 mg total) by mouth daily. 90 tablet 3   [START ON 07/10/2024] amphetamine -dextroamphetamine  (ADDERALL) 30 MG tablet Take 1 tablet by mouth 2 (two) times daily. Do not take 2nd dose after 2 pm. 60 tablet 0   [START ON 06/12/2024] amphetamine -dextroamphetamine  (ADDERALL) 30 MG tablet Take 1 tablet by mouth 2 (two) times daily. Do not take 2nd dose after 2 pm. 60 tablet 0   amphetamine -dextroamphetamine  (ADDERALL) 30 MG tablet Take 1 tablet by mouth 2 (two) times daily. Do not take 2nd dose after 2 pm. 60 tablet 0   cloNIDine  (CATAPRES ) 0.1 MG tablet  TAKE 1 TABLET(0.1 MG) BY MOUTH AT BEDTIME 90 tablet 1   clotrimazole -betamethasone  (LOTRISONE ) cream Apply 1 Application topically daily. To affected areas until resolved. 45 g 1   COVID-19 Subunit Vacc-Novavax (NOVAVAX COVID-19 VACCINE ) 5 MCG/0.5ML SUSY Inject 5 mcg into the muscle once as needed for up to 1 dose (covid vaccination). 0.5 mL 0   pneumococcal 20-valent conjugate vaccine (PREVNAR 20) 0.5 ML injection Inject 0.5 mLs into the muscle once as needed for up to 1 dose for immunization. 0.5 mL 0   rizatriptan  (MAXALT -MLT) 10 MG disintegrating tablet Take 1 tablet (10 mg total) by mouth as needed for migraine. May repeat in 2 hours if needed 10 tablet 5   TAURINE PO Take by mouth.     tirzepatide  (ZEPBOUND ) 2.5 MG/0.5ML injection vial Inject 2.5 mg into the skin once a week. 2 mL 5   triamcinolone  (NASACORT ) 55 MCG/ACT AERO nasal inhaler Place 2 sprays into the nose daily. 16.9 mL 5   valACYclovir  (VALTREX ) 500 MG tablet TAKE 1 TABLET BY MOUTH TWICE A DAY 180 tablet 1   No current facility-administered medications on file prior to visit.    There are no Patient Instructions on file for this visit. No follow-ups on file.   Armando Bukhari E Constantin Hillery, NP

## 2024-05-30 ENCOUNTER — Telehealth (INDEPENDENT_AMBULATORY_CARE_PROVIDER_SITE_OTHER): Payer: Self-pay | Admitting: Nurse Practitioner

## 2024-05-30 NOTE — Telephone Encounter (Signed)
 Spoke with patient regarding question via MyChart. I advised the reason I had not contacted her is because I have not heard back from her insurance on the prior auth for the laser ablations. I advised that as soon as I heard back from insurance that I would call. Pt acknowledged.

## 2024-06-04 DIAGNOSIS — K641 Second degree hemorrhoids: Secondary | ICD-10-CM | POA: Diagnosis not present

## 2024-06-04 DIAGNOSIS — M25562 Pain in left knee: Secondary | ICD-10-CM | POA: Diagnosis not present

## 2024-06-04 DIAGNOSIS — G8929 Other chronic pain: Secondary | ICD-10-CM | POA: Diagnosis not present

## 2024-06-04 DIAGNOSIS — M25561 Pain in right knee: Secondary | ICD-10-CM | POA: Diagnosis not present

## 2024-06-11 ENCOUNTER — Telehealth (INDEPENDENT_AMBULATORY_CARE_PROVIDER_SITE_OTHER): Payer: Self-pay | Admitting: Vascular Surgery

## 2024-06-11 ENCOUNTER — Other Ambulatory Visit (INDEPENDENT_AMBULATORY_CARE_PROVIDER_SITE_OTHER): Payer: Self-pay

## 2024-06-11 DIAGNOSIS — M170 Bilateral primary osteoarthritis of knee: Secondary | ICD-10-CM | POA: Diagnosis not present

## 2024-06-11 NOTE — Telephone Encounter (Signed)
 sent

## 2024-06-11 NOTE — Telephone Encounter (Signed)
 Pt is scheduled for a right leg GSV laser ablation on 10.9.25 and a left leg GSV laser ablation on 11.6.25 with Dr. Jama. Patient will need the standard RX protocol called in to [facility] on [address] in [place]. Thank you.

## 2024-06-12 DIAGNOSIS — M25561 Pain in right knee: Secondary | ICD-10-CM | POA: Diagnosis not present

## 2024-06-12 DIAGNOSIS — G8929 Other chronic pain: Secondary | ICD-10-CM | POA: Diagnosis not present

## 2024-06-12 DIAGNOSIS — M25562 Pain in left knee: Secondary | ICD-10-CM | POA: Diagnosis not present

## 2024-06-13 MED ORDER — ALPRAZOLAM 0.5 MG PO TABS: ORAL_TABLET | ORAL | 0 refills | Status: DC

## 2024-06-19 NOTE — Progress Notes (Signed)
    MRN : [id number]  Megan [name] is a 63 y.o. ([birth year]) female who presents with chief complaint of No chief complaint on file. .    The patient's right lower extremity was sterilely prepped and draped.  The ultrasound machine was used to visualize the right great saphenous vein throughout its course.  A segment below the knee was selected for access.  The saphenous vein was accessed without difficulty using ultrasound guidance with a micropuncture needle.   An 0.018  wire was placed beyond the saphenofemoral junction through the sheath and the microneedle was removed.  The 65 cm sheath was then placed over the wire and the wire and dilator were removed.  The laser fiber was placed through the sheath and its tip was placed approximately 2 cm below the saphenofemoral junction.  Tumescent anesthesia was then created with a dilute lidocaine  solution.  Laser energy was then delivered with constant withdrawal of the sheath and laser fiber.  Approximately 1997 Joules of energy were delivered over a length of 33 cm.  Sterile dressings were placed.  The patient tolerated the procedure well without complications.

## 2024-06-20 ENCOUNTER — Encounter (INDEPENDENT_AMBULATORY_CARE_PROVIDER_SITE_OTHER): Payer: Self-pay | Admitting: Vascular Surgery

## 2024-06-20 ENCOUNTER — Ambulatory Visit (INDEPENDENT_AMBULATORY_CARE_PROVIDER_SITE_OTHER): Admitting: Vascular Surgery

## 2024-06-20 VITALS — BP 144/90 | HR 96 | Resp 18 | Ht <= 58 in | Wt 159.4 lb

## 2024-06-20 DIAGNOSIS — I83813 Varicose veins of bilateral lower extremities with pain: Secondary | ICD-10-CM

## 2024-06-23 ENCOUNTER — Encounter (INDEPENDENT_AMBULATORY_CARE_PROVIDER_SITE_OTHER): Payer: Self-pay | Admitting: Vascular Surgery

## 2024-06-25 ENCOUNTER — Other Ambulatory Visit (INDEPENDENT_AMBULATORY_CARE_PROVIDER_SITE_OTHER): Payer: Self-pay | Admitting: Vascular Surgery

## 2024-06-25 DIAGNOSIS — I83813 Varicose veins of bilateral lower extremities with pain: Secondary | ICD-10-CM

## 2024-06-26 ENCOUNTER — Encounter: Payer: Self-pay | Admitting: Nurse Practitioner

## 2024-06-27 ENCOUNTER — Other Ambulatory Visit (INDEPENDENT_AMBULATORY_CARE_PROVIDER_SITE_OTHER)

## 2024-06-27 DIAGNOSIS — I83813 Varicose veins of bilateral lower extremities with pain: Secondary | ICD-10-CM | POA: Diagnosis not present

## 2024-07-11 DIAGNOSIS — N393 Stress incontinence (female) (male): Secondary | ICD-10-CM | POA: Diagnosis not present

## 2024-07-11 DIAGNOSIS — N816 Rectocele: Secondary | ICD-10-CM | POA: Diagnosis not present

## 2024-07-12 DIAGNOSIS — M170 Bilateral primary osteoarthritis of knee: Secondary | ICD-10-CM | POA: Diagnosis not present

## 2024-07-15 ENCOUNTER — Encounter: Payer: BLUE CROSS/BLUE SHIELD | Admitting: Nurse Practitioner

## 2024-07-18 ENCOUNTER — Ambulatory Visit (INDEPENDENT_AMBULATORY_CARE_PROVIDER_SITE_OTHER): Admitting: Vascular Surgery

## 2024-07-18 ENCOUNTER — Encounter (INDEPENDENT_AMBULATORY_CARE_PROVIDER_SITE_OTHER): Payer: Self-pay | Admitting: Vascular Surgery

## 2024-07-18 VITALS — BP 118/82 | HR 112 | Resp 18 | Ht <= 58 in | Wt 154.6 lb

## 2024-07-18 DIAGNOSIS — I83813 Varicose veins of bilateral lower extremities with pain: Secondary | ICD-10-CM

## 2024-07-24 ENCOUNTER — Other Ambulatory Visit (INDEPENDENT_AMBULATORY_CARE_PROVIDER_SITE_OTHER): Payer: Self-pay | Admitting: Nurse Practitioner

## 2024-07-24 ENCOUNTER — Encounter (INDEPENDENT_AMBULATORY_CARE_PROVIDER_SITE_OTHER): Payer: Self-pay

## 2024-07-24 ENCOUNTER — Telehealth: Payer: Self-pay

## 2024-07-24 DIAGNOSIS — I83813 Varicose veins of bilateral lower extremities with pain: Secondary | ICD-10-CM

## 2024-07-25 ENCOUNTER — Ambulatory Visit (INDEPENDENT_AMBULATORY_CARE_PROVIDER_SITE_OTHER)

## 2024-07-25 DIAGNOSIS — I83813 Varicose veins of bilateral lower extremities with pain: Secondary | ICD-10-CM | POA: Diagnosis not present

## 2024-08-05 ENCOUNTER — Ambulatory Visit (INDEPENDENT_AMBULATORY_CARE_PROVIDER_SITE_OTHER): Admitting: Nurse Practitioner

## 2024-08-05 ENCOUNTER — Encounter: Payer: Self-pay | Admitting: Nurse Practitioner

## 2024-08-05 VITALS — BP 135/80 | HR 82 | Temp 97.0°F | Resp 16 | Ht <= 58 in | Wt 156.2 lb

## 2024-08-05 DIAGNOSIS — F988 Other specified behavioral and emotional disorders with onset usually occurring in childhood and adolescence: Secondary | ICD-10-CM

## 2024-08-05 DIAGNOSIS — F411 Generalized anxiety disorder: Secondary | ICD-10-CM

## 2024-08-05 DIAGNOSIS — F5101 Primary insomnia: Secondary | ICD-10-CM

## 2024-08-05 DIAGNOSIS — G44011 Episodic cluster headache, intractable: Secondary | ICD-10-CM | POA: Diagnosis not present

## 2024-08-05 MED ORDER — AMPHETAMINE-DEXTROAMPHETAMINE 30 MG PO TABS: 30.0000 mg | ORAL_TABLET | Freq: Two times a day (BID) | ORAL | 0 refills | Status: AC

## 2024-08-05 MED ORDER — ALPRAZOLAM 0.5 MG PO TABS: 0.5000 mg | ORAL_TABLET | Freq: Two times a day (BID) | ORAL | 2 refills | Status: AC | PRN

## 2024-08-05 NOTE — Progress Notes (Addendum)
 [facility] [address] [place], [place] [address]  Internal MEDICINE  Office Visit Note  Patient Name: Megan [name]  [id number]  [id number]  Date of Service: 08/05/2024  Chief Complaint  Patient presents with   Follow-up    HPI Ivanna presents for a follow-up visit for headaches, weight loss, ADHD, and anxiety.  Cluster headache -- takes rizatriptan  as needed.  Weight loss -- taking zepbound  via manufacturer.down 3 lbs from October.  ADHD -- current dose of adderall is effective. Her BP and heart rate are stable. She denies any palpitations or other adverse side effects of the medication.  Anxiety -- takes alprazolam  as needed.     Current Medication: Outpatient Encounter Medications as of 08/05/2024  Medication Sig   ALPRAZolam  (XANAX ) 0.5 MG tablet Take 1 tablet (0.5 mg total) by mouth 2 (two) times daily as needed for anxiety.   amLODipine  (NORVASC ) 10 MG tablet Take 1 tablet (10 mg total) by mouth daily. (Patient not taking: Reported on 08/15/2024)   [START ON 09/30/2024] amphetamine -dextroamphetamine  (ADDERALL) 30 MG tablet Take 1 tablet by mouth 2 (two) times daily. Do not take 2nd dose after 2 pm.   amphetamine -dextroamphetamine  (ADDERALL) 30 MG tablet Take 1 tablet by mouth 2 (two) times daily. Do not take 2nd dose after 2 pm.   amphetamine -dextroamphetamine  (ADDERALL) 30 MG tablet Take 1 tablet by mouth 2 (two) times daily. Do not take 2nd dose after 2 pm.   cloNIDine  (CATAPRES ) 0.1 MG tablet TAKE 1 TABLET(0.1 MG) BY MOUTH AT BEDTIME   clotrimazole -betamethasone  (LOTRISONE ) cream Apply 1 Application topically daily. To affected areas until resolved. (Patient not taking: Reported on 08/15/2024)   COVID-19 Subunit Vacc-Novavax (NOVAVAX COVID-19 VACCINE ) 5 MCG/0.5ML SUSY Inject 5 mcg into the muscle once as needed for up to 1 dose (covid vaccination).   pneumococcal 20-valent conjugate vaccine (PREVNAR 20) 0.5 ML injection Inject 0.5 mLs into the muscle  once as needed for up to 1 dose for immunization.   rizatriptan  (MAXALT -MLT) 10 MG disintegrating tablet Take 1 tablet (10 mg total) by mouth as needed for migraine. May repeat in 2 hours if needed   TAURINE PO Take by mouth. (Patient not taking: Reported on 08/15/2024)   tirzepatide  (ZEPBOUND ) 2.5 MG/0.5ML injection vial Inject 2.5 mg into the skin once a week.   triamcinolone  (NASACORT ) 55 MCG/ACT AERO nasal inhaler Place 2 sprays into the nose daily. (Patient not taking: Reported on 08/15/2024)   valACYclovir  (VALTREX ) 500 MG tablet TAKE 1 TABLET BY MOUTH TWICE A DAY   [DISCONTINUED] ALPRAZolam  (XANAX ) 0.5 MG tablet Take 1 tablet (0.5 mg total) by mouth 2 (two) times daily as needed for anxiety.   [DISCONTINUED] ALPRAZolam  (XANAX ) 0.5 MG tablet Take 1st tablet 1 hour before procedure then take 2nd tablet once arrived at the office   [DISCONTINUED] ALPRAZolam  (XANAX ) 0.5 MG tablet Take 1st tablet 1 hour before procedure then take 2nd tablet once arrived at the office   [DISCONTINUED] amphetamine -dextroamphetamine  (ADDERALL) 30 MG tablet Take 1 tablet by mouth 2 (two) times daily. Do not take 2nd dose after 2 pm.   [DISCONTINUED] amphetamine -dextroamphetamine  (ADDERALL) 30 MG tablet Take 1 tablet by mouth 2 (two) times daily. Do not take 2nd dose after 2 pm.   [DISCONTINUED] amphetamine -dextroamphetamine  (ADDERALL) 30 MG tablet Take 1 tablet by mouth 2 (two) times daily. Do not take 2nd dose after 2 pm.   No facility-administered encounter medications on file as of 08/05/2024.    Surgical History: Past Surgical History:  Procedure Laterality Date   BREAST BIOPSY Left 2002   benign   BUNIONECTOMY Right    CESAREAN SECTION     COLONOSCOPY N/A 01/10/2022   Procedure: COLONOSCOPY;  Surgeon: Jinny Carmine, MD;  Location: [facility];  Service: Endoscopy;  Laterality: N/A;   ESOPHAGOGASTRODUODENOSCOPY N/A 01/10/2022   Procedure: ESOPHAGOGASTRODUODENOSCOPY (EGD);  Surgeon: Jinny Carmine, MD;   Location: [facility];  Service: Endoscopy;  Laterality: N/A;  Latex    Medical History: Past Medical History:  Diagnosis Date   ADD (attention deficit disorder)    Arthritis    feet   Family history of adverse reaction to anesthesia    Sister - MI during knee surgery   Motion sickness    Sea Sick   Pre-hypertension    Wears contact lenses     Family History: Family History  Problem Relation Age of Onset   Lung cancer Father    Diabetes Sister    Breast cancer Neg Hx     Social History   Socioeconomic History   Marital status: Married    Spouse name: Not on file   Number of children: Not on file   Years of education: Not on file   Highest education level: Not on file  Occupational History   Not on file  Tobacco Use   Smoking status: Never   Smokeless tobacco: Never  Vaping Use   Vaping status: Never Used  Substance and Sexual Activity   Alcohol use: Not Currently    Comment: social   Drug use: Not Currently    Types: Marijuana    Comment: once in a while    Sexual activity: Not on file  Other Topics Concern   Not on file  Social History Narrative   Not on file   Social Drivers of Health   Tobacco Use: Low Risk (09/11/2024)   Patient History    Smoking Tobacco Use: Never    Smokeless Tobacco Use: Never    Passive Exposure: Not on file  Financial Resource Strain: Not on file  Food Insecurity: Not on file  Transportation Needs: Not on file  Physical Activity: Not on file  Stress: Not on file  Social Connections: Not on file  Intimate Partner Violence: Not on file  Depression (PHQ2-9): Low Risk (05/15/2024)   Depression (PHQ2-9)    PHQ-2 Score: 0  Alcohol Screen: Low Risk (06/28/2022)   Alcohol Screen    Last Alcohol Screening Score (AUDIT): 2  Housing: Unknown (05/06/2024)   Received from [facility]   Epic    Unable to Pay for Housing in the Last Year: Not on file    Number of Times Moved in the Last Year: Not on file     At any time in the past 12 months, were you homeless or living in a shelter (including now)?: No  Utilities: Not on file  Health Literacy: Not on file      Review of Systems  Constitutional:  Positive for fatigue.  HENT: Negative.    Eyes:  Positive for pain.  Respiratory: Negative.  Negative for cough, chest tightness, shortness of breath and wheezing.   Cardiovascular: Negative.  Negative for chest pain and palpitations.  Gastrointestinal:  Positive for abdominal pain and nausea. Negative for constipation, diarrhea and vomiting.  Musculoskeletal: Negative.   Neurological:  Positive for headaches.  Psychiatric/Behavioral:  Positive for decreased concentration and sleep disturbance. Negative for self-injury and suicidal ideas. The patient is nervous/anxious.  Vital Signs: BP 135/80   Pulse 82   Temp (!) 97 F (36.1 C)   Resp 16   Ht 4' 10 (1.473 m)   Wt 156 lb 3.2 oz (70.9 kg)   SpO2 99%   BMI 32.65 kg/m    Physical Exam Vitals reviewed.  Constitutional:      General: She is not in acute distress.    Appearance: Normal appearance. She is obese. She is not ill-appearing.  HENT:     Head: Normocephalic and atraumatic.  Eyes:     Pupils: Pupils are equal, round, and reactive to light.  Cardiovascular:     Rate and Rhythm: Normal rate and regular rhythm.  Pulmonary:     Effort: Pulmonary effort is normal. No respiratory distress.  Abdominal:     General: Bowel sounds are normal. There is no distension.     Palpations: Abdomen is soft.     Tenderness: There is no guarding.     Hernia: No hernia is present.  Skin:    General: Skin is warm and dry.     Capillary Refill: Capillary refill takes less than 2 seconds.  Neurological:     Mental Status: She is alert and oriented to person, place, and time.  Psychiatric:        Mood and Affect: Mood normal.        Behavior: Behavior normal.        Assessment/Plan: 1. Intractable episodic cluster headache  (Primary) Continue prn rizatriptan  as prescribed   2. GAD (generalized anxiety disorder) Continue prn alprazolam  as prescribed. Follow up in 3 months for additional refills.  - ALPRAZolam  (XANAX ) 0.5 MG tablet; Take 1 tablet (0.5 mg total) by mouth 2 (two) times daily as needed for anxiety.  Dispense: 60 tablet; Refill: 2  3. Primary insomnia Continue prn alprazolam  as prescribed. Follow up in 3 months for additional refills.  - ALPRAZolam  (XANAX ) 0.5 MG tablet; Take 1 tablet (0.5 mg total) by mouth 2 (two) times daily as needed for anxiety.  Dispense: 60 tablet; Refill: 2  4. Attention deficit disorder (ADD) without hyperactivity Continue adderall as prescribed. Follow up in 3 months for additional refills and UDS due at next visit.  - amphetamine -dextroamphetamine  (ADDERALL) 30 MG tablet; Take 1 tablet by mouth 2 (two) times daily. Do not take 2nd dose after 2 pm.  Dispense: 60 tablet; Refill: 0 - amphetamine -dextroamphetamine  (ADDERALL) 30 MG tablet; Take 1 tablet by mouth 2 (two) times daily. Do not take 2nd dose after 2 pm.  Dispense: 60 tablet; Refill: 0 - amphetamine -dextroamphetamine  (ADDERALL) 30 MG tablet; Take 1 tablet by mouth 2 (two) times daily. Do not take 2nd dose after 2 pm.  Dispense: 60 tablet; Refill: 0   General Counseling: Dayra verbalizes understanding of the findings of todays visit and agrees with plan of treatment. I have discussed any further diagnostic evaluation that may be needed or ordered today. We also reviewed her medications today. she has been encouraged to call the office with any questions or concerns that should arise related to todays visit.    No orders of the defined types were placed in this encounter.   Meds ordered this encounter  Medications   ALPRAZolam  (XANAX ) 0.5 MG tablet    Sig: Take 1 tablet (0.5 mg total) by mouth 2 (two) times daily as needed for anxiety.    Dispense:  60 tablet    Refill:  2    For future refills    amphetamine -dextroamphetamine  (ADDERALL)  30 MG tablet    Sig: Take 1 tablet by mouth 2 (two) times daily. Do not take 2nd dose after 2 pm.    Dispense:  60 tablet    Refill:  0    Fill for January   amphetamine -dextroamphetamine  (ADDERALL) 30 MG tablet    Sig: Take 1 tablet by mouth 2 (two) times daily. Do not take 2nd dose after 2 pm.    Dispense:  60 tablet    Refill:  0    Fill for december   amphetamine -dextroamphetamine  (ADDERALL) 30 MG tablet    Sig: Take 1 tablet by mouth 2 (two) times daily. Do not take 2nd dose after 2 pm.    Dispense:  60 tablet    Refill:  0    Fill for november    Return in about 3 months (around 10/30/2024) for F/U, ADHD med check, anxiety med refill, Espn Zeman PCP.   Total time spent:30 Minutes Time spent includes review of chart, medications, test results, and follow up plan with the patient.   [place] Controlled Substance Database was reviewed by me.  This patient was seen by Mardy Maxin, FNP-C in collaboration with Dr. Sigrid Bathe as a part of collaborative care agreement.   Charman Blasco R. Maxin, MSN, FNP-C Internal medicine

## 2024-08-12 NOTE — Progress Notes (Signed)
 Megan [name]                                          MRN: [id number]   08/12/2024   The [facility] Quality Team Specialist reviewed this patient medical record for the purposes of chart review for care gap closure. The following were reviewed: chart review for care gap closure-controlling blood pressure.    [facility] Quality Team

## 2024-08-12 NOTE — Telephone Encounter (Signed)
 Done

## 2024-08-15 ENCOUNTER — Ambulatory Visit (INDEPENDENT_AMBULATORY_CARE_PROVIDER_SITE_OTHER): Admitting: Vascular Surgery

## 2024-08-15 ENCOUNTER — Encounter (INDEPENDENT_AMBULATORY_CARE_PROVIDER_SITE_OTHER): Payer: Self-pay | Admitting: Vascular Surgery

## 2024-08-15 VITALS — BP 148/90 | HR 105 | Resp 16 | Ht <= 58 in | Wt 156.0 lb

## 2024-08-22 ENCOUNTER — Encounter (INDEPENDENT_AMBULATORY_CARE_PROVIDER_SITE_OTHER): Payer: Self-pay | Admitting: Vascular Surgery

## 2024-08-22 NOTE — Progress Notes (Unsigned)
 Subjective:    Patient ID: Megan [name], female    DOB: 08-17-1961, 63 y.o.   MRN: [id number] Chief Complaint  Patient presents with   Routine Post Op    4 and 8 week post bilateral GSV laser.    Megan [name] is a 63 yo female      History of Present Illness            Results           Review of Systems     Objective:   Physical Exam  Physical Exam          BP (!) 148/90   Pulse (!) 105   Resp 16   Ht 4' 10 (1.473 m)   Wt 156 lb (70.8 kg)   BMI 32.60 kg/m   Past Medical History:  Diagnosis Date   ADD (attention deficit disorder)    Arthritis    feet   Family history of adverse reaction to anesthesia    Sister - MI during knee surgery   Motion sickness    Sea Sick   Pre-hypertension    Wears contact lenses     Social History   Socioeconomic History   Marital status: Married    Spouse name: Not on file   Number of children: Not on file   Years of education: Not on file   Highest education level: Not on file  Occupational History   Not on file  Tobacco Use   Smoking status: Never   Smokeless tobacco: Never  Vaping Use   Vaping status: Never Used  Substance and Sexual Activity   Alcohol use: Not Currently    Comment: social   Drug use: Not Currently    Types: Marijuana    Comment: once in a while    Sexual activity: Not on file  Other Topics Concern   Not on file  Social History Narrative   Not on file   Social Drivers of Health   Tobacco Use: Low Risk (08/22/2024)   Patient History    Smoking Tobacco Use: Never    Smokeless Tobacco Use: Never    Passive Exposure: Not on file  Financial Resource Strain: Not on file  Food Insecurity: Not on file  Transportation Needs: Not on file  Physical Activity: Not on file  Stress: Not on file  Social Connections: Not on file  Intimate Partner Violence: Not on file  Depression (PHQ2-9): Low Risk (05/15/2024)   Depression (PHQ2-9)    PHQ-2 Score: 0  Alcohol Screen: Low  Risk (06/28/2022)   Alcohol Screen    Last Alcohol Screening Score (AUDIT): 2  Housing: Unknown (05/06/2024)   Received from [facility]   Epic    Unable to Pay for Housing in the Last Year: Not on file    Number of Times Moved in the Last Year: Not on file    At any time in the past 12 months, were you homeless or living in a shelter (including now)?: No  Utilities: Not on file  Health Literacy: Not on file    Past Surgical History:  Procedure Laterality Date   BREAST BIOPSY Left 2002   benign   BUNIONECTOMY Right    CESAREAN SECTION     COLONOSCOPY N/A 01/10/2022   Procedure: COLONOSCOPY;  Surgeon: Jinny Carmine, MD;  Location: [facility];  Service: Endoscopy;  Laterality: N/A;   ESOPHAGOGASTRODUODENOSCOPY N/A 01/10/2022   Procedure: ESOPHAGOGASTRODUODENOSCOPY (EGD);  Surgeon: Jinny Carmine,  MD;  Location: [facility];  Service: Endoscopy;  Laterality: N/A;  Latex    Family History  Problem Relation Age of Onset   Lung cancer Father    Diabetes Sister    Breast cancer Neg Hx     Allergies[1]     Latest Ref Rng & Units 09/26/2022   10:33 AM 03/23/2022   10:10 AM 02/01/2021    9:32 AM  CBC  WBC 3.4 - 10.8 x10E3/uL 6.1  7.7  6.0   Hemoglobin 11.1 - 15.9 g/dL 83.3  83.8  85.5   Hematocrit 34.0 - 46.6 % 50.6  49.9  43.4   Platelets 150 - 450 x10E3/uL 360  333  224       CMP     Component Value Date/Time   NA 143 09/26/2022 1033   K 4.0 09/26/2022 1033   CL 100 09/26/2022 1033   CO2 25 09/26/2022 1033   GLUCOSE 94 09/26/2022 1033   GLUCOSE 113 (H) 02/01/2021 0932   BUN 13 09/26/2022 1033   CREATININE 0.73 09/26/2022 1033   CALCIUM 10.3 09/26/2022 1033   PROT 7.3 09/26/2022 1033   ALBUMIN 4.6 09/26/2022 1033   AST 24 09/26/2022 1033   ALT 31 09/26/2022 1033   ALKPHOS 95 09/26/2022 1033   BILITOT 1.0 09/26/2022 1033   EGFR 92 09/26/2022 1033   GFRNONAA >60 02/01/2021 0932     No results found.     Assessment & Plan:   1.  Varicose veins of both lower extremities with pain (Primary) Patient is now post Right lower extremity Laser treatment from 06/20/2024. Follow up Right lower extremity Venous Ultrasound on 06/27/2024 shows right GSV is now occluded.   On 07/25/2024 a venous doppler Ultrasound of her left lower extremity the left GSV was successfully closed. However I could not find a procedure note showing when this had been completed.   Patient is very happy with the results of the laser treatment. She denies any pain or discomfort to either lower extremity. She has one location along the medial side of her left knee with spider veins, non painful she does not like the look of but it's not bothering her.   She wishes to follow up PRN for any further pain and discomfort to her bilateral lower extremities. I agree with the plan.   2. Mixed hyperlipidemia Continue statin as ordered and reviewed, no changes at this time  3. Essential hypertension Continue antihypertensive medications as already ordered, these medications have been reviewed and there are no changes at this time.            Medications Ordered Prior to Encounter[2]  There are no Patient Instructions on file for this visit. No follow-ups on file.   Gwendlyn JONELLE Shank, NP       [1]  Allergies Allergen Reactions   Codeine Other (See Comments)    hyper   Latex     Irritation with mucous membrane contact   Morphine Hives   Morphine And Codeine Hives   Sunflower Oil Itching    Sunflower seeds make lips itch   Vicodin [Hydrocodone-Acetaminophen ] Other (See Comments)    Stomach pain, hyper   Nickel Itching and Rash  [2]  Current Outpatient Medications on File Prior to Visit  Medication Sig Dispense Refill   ALPRAZolam  (XANAX ) 0.5 MG tablet Take 1 tablet (0.5 mg total) by mouth 2 (two) times daily as needed for anxiety. 60 tablet 2   [START ON 09/30/2024] amphetamine -dextroamphetamine  (ADDERALL)  30 MG tablet Take 1 tablet by mouth 2 (two)  times daily. Do not take 2nd dose after 2 pm. 60 tablet 0   [START ON 09/02/2024] amphetamine -dextroamphetamine  (ADDERALL) 30 MG tablet Take 1 tablet by mouth 2 (two) times daily. Do not take 2nd dose after 2 pm. 60 tablet 0   amphetamine -dextroamphetamine  (ADDERALL) 30 MG tablet Take 1 tablet by mouth 2 (two) times daily. Do not take 2nd dose after 2 pm. 60 tablet 0   cloNIDine  (CATAPRES ) 0.1 MG tablet TAKE 1 TABLET(0.1 MG) BY MOUTH AT BEDTIME 90 tablet 1   COVID-19 Subunit Vacc-Novavax (NOVAVAX COVID-19 VACCINE ) 5 MCG/0.5ML SUSY Inject 5 mcg into the muscle once as needed for up to 1 dose (covid vaccination). 0.5 mL 0   pneumococcal 20-valent conjugate vaccine (PREVNAR 20) 0.5 ML injection Inject 0.5 mLs into the muscle once as needed for up to 1 dose for immunization. 0.5 mL 0   rizatriptan  (MAXALT -MLT) 10 MG disintegrating tablet Take 1 tablet (10 mg total) by mouth as needed for migraine. May repeat in 2 hours if needed 10 tablet 5   tirzepatide  (ZEPBOUND ) 2.5 MG/0.5ML injection vial Inject 2.5 mg into the skin once a week. 2 mL 5   valACYclovir  (VALTREX ) 500 MG tablet TAKE 1 TABLET BY MOUTH TWICE A DAY 180 tablet 1   amLODipine  (NORVASC ) 10 MG tablet Take 1 tablet (10 mg total) by mouth daily. (Patient not taking: Reported on 08/15/2024) 90 tablet 3   clotrimazole -betamethasone  (LOTRISONE ) cream Apply 1 Application topically daily. To affected areas until resolved. (Patient not taking: Reported on 08/15/2024) 45 g 1   TAURINE PO Take by mouth. (Patient not taking: Reported on 08/15/2024)     triamcinolone  (NASACORT ) 55 MCG/ACT AERO nasal inhaler Place 2 sprays into the nose daily. (Patient not taking: Reported on 08/15/2024) 16.9 mL 5   No current facility-administered medications on file prior to visit.

## 2024-09-11 ENCOUNTER — Encounter: Payer: Self-pay | Admitting: Nurse Practitioner

## 2024-10-03 ENCOUNTER — Telehealth: Payer: Self-pay | Admitting: Nurse Practitioner

## 2024-10-03 NOTE — Telephone Encounter (Signed)
 Lvm to change 10/07/2024 appointment to a virtual due to weather, or to reschedule -Andree

## 2024-10-07 ENCOUNTER — Ambulatory Visit: Admitting: Physician Assistant

## 2024-10-10 ENCOUNTER — Encounter (INDEPENDENT_AMBULATORY_CARE_PROVIDER_SITE_OTHER): Payer: Self-pay | Admitting: Vascular Surgery

## 2024-10-10 NOTE — Progress Notes (Signed)
" ° ° °  MRN : [id number]  Megan [name] is a 64 y.o. ([birth year]) female who presents with chief complaint of No chief complaint on file. .    The patient's left lower extremity was sterilely prepped and draped.  The ultrasound machine was used to visualize the left great saphenous vein throughout its course.  A segment at the knee was selected for access.  The saphenous vein was accessed without difficulty using ultrasound guidance with a micropuncture needle.   An 0.018  wire was placed beyond the saphenofemoral junction through the sheath and the microneedle was removed.  The 65 cm sheath was then placed over the wire and the wire and dilator were removed.  The laser fiber was placed through the sheath and its tip was placed approximately 2 cm below the saphenofemoral junction.  Tumescent anesthesia was then created with a dilute lidocaine  solution.  Laser energy was then delivered with constant withdrawal of the sheath and laser fiber.  Approximately 1440 Joules of energy were delivered over a length of 30 cm.  Sterile dressings were placed.  The patient tolerated the procedure well without complications. "

## 2024-10-13 ENCOUNTER — Other Ambulatory Visit: Payer: Self-pay | Admitting: Nurse Practitioner

## 2024-10-13 DIAGNOSIS — N951 Menopausal and female climacteric states: Secondary | ICD-10-CM

## 2024-10-13 DIAGNOSIS — F5101 Primary insomnia: Secondary | ICD-10-CM

## 2024-10-14 ENCOUNTER — Ambulatory Visit: Admitting: Nurse Practitioner

## 2024-10-28 ENCOUNTER — Ambulatory Visit: Admitting: Nurse Practitioner

## 2025-02-13 ENCOUNTER — Ambulatory Visit (INDEPENDENT_AMBULATORY_CARE_PROVIDER_SITE_OTHER): Admitting: Vascular Surgery

## 2025-05-20 ENCOUNTER — Ambulatory Visit: Admitting: Nurse Practitioner
